# Patient Record
Sex: Female | Born: 1940 | Race: White | Hispanic: No | Smoking: Current some day smoker
Health system: Southern US, Community
[De-identification: ages and names within clinical notes are randomized; demographics above are authoritative.]

## PROBLEM LIST (undated history)

## (undated) DIAGNOSIS — R112 Nausea with vomiting, unspecified: Secondary | ICD-10-CM

## (undated) DIAGNOSIS — J189 Pneumonia, unspecified organism: Secondary | ICD-10-CM

## (undated) DIAGNOSIS — E7800 Pure hypercholesterolemia, unspecified: Secondary | ICD-10-CM

## (undated) DIAGNOSIS — Z9889 Other specified postprocedural states: Secondary | ICD-10-CM

## (undated) DIAGNOSIS — F419 Anxiety disorder, unspecified: Secondary | ICD-10-CM

## (undated) DIAGNOSIS — IMO0001 Reserved for inherently not codable concepts without codable children: Secondary | ICD-10-CM

## (undated) DIAGNOSIS — M1990 Unspecified osteoarthritis, unspecified site: Secondary | ICD-10-CM

## (undated) DIAGNOSIS — Z8711 Personal history of peptic ulcer disease: Secondary | ICD-10-CM

## (undated) DIAGNOSIS — K219 Gastro-esophageal reflux disease without esophagitis: Secondary | ICD-10-CM

## (undated) DIAGNOSIS — R7303 Prediabetes: Secondary | ICD-10-CM

## (undated) DIAGNOSIS — G43909 Migraine, unspecified, not intractable, without status migrainosus: Secondary | ICD-10-CM

## (undated) DIAGNOSIS — U071 COVID-19: Secondary | ICD-10-CM

## (undated) DIAGNOSIS — Z8719 Personal history of other diseases of the digestive system: Secondary | ICD-10-CM

## (undated) DIAGNOSIS — I10 Essential (primary) hypertension: Secondary | ICD-10-CM

## (undated) DIAGNOSIS — C50911 Malignant neoplasm of unspecified site of right female breast: Secondary | ICD-10-CM

## (undated) HISTORY — PX: APPENDECTOMY: SHX54

## (undated) HISTORY — PX: VAGINAL HYSTERECTOMY: SUR661

## (undated) HISTORY — PX: DILATION AND CURETTAGE OF UTERUS: SHX78

## (undated) HISTORY — PX: TONSILLECTOMY: SUR1361

---

## 1997-01-11 DIAGNOSIS — C50911 Malignant neoplasm of unspecified site of right female breast: Secondary | ICD-10-CM

## 1997-01-11 HISTORY — PX: BREAST BIOPSY: SHX20

## 1997-01-11 HISTORY — DX: Malignant neoplasm of unspecified site of right female breast: C50.911

## 1997-01-11 HISTORY — PX: BREAST LUMPECTOMY: SHX2

## 1997-11-04 ENCOUNTER — Ambulatory Visit (HOSPITAL_COMMUNITY): Admission: RE | Admit: 1997-11-04 | Discharge: 1997-11-04 | Payer: Self-pay | Admitting: Neurology

## 1997-11-04 ENCOUNTER — Encounter: Payer: Self-pay | Admitting: Neurology

## 1997-12-02 ENCOUNTER — Other Ambulatory Visit: Admission: RE | Admit: 1997-12-02 | Discharge: 1997-12-02 | Payer: Self-pay | Admitting: General Surgery

## 1997-12-09 ENCOUNTER — Ambulatory Visit (HOSPITAL_BASED_OUTPATIENT_CLINIC_OR_DEPARTMENT_OTHER): Admission: RE | Admit: 1997-12-09 | Discharge: 1997-12-09 | Payer: Self-pay | Admitting: General Surgery

## 1997-12-20 ENCOUNTER — Ambulatory Visit (HOSPITAL_COMMUNITY): Admission: RE | Admit: 1997-12-20 | Discharge: 1997-12-20 | Payer: Self-pay | Admitting: Neurology

## 1997-12-27 ENCOUNTER — Ambulatory Visit (HOSPITAL_COMMUNITY): Admission: RE | Admit: 1997-12-27 | Discharge: 1997-12-27 | Payer: Self-pay | Admitting: General Surgery

## 1997-12-27 ENCOUNTER — Encounter: Payer: Self-pay | Admitting: General Surgery

## 1998-01-22 ENCOUNTER — Encounter: Admission: RE | Admit: 1998-01-22 | Discharge: 1998-04-22 | Payer: Self-pay | Admitting: *Deleted

## 1998-04-24 ENCOUNTER — Encounter: Payer: Self-pay | Admitting: Neurology

## 1998-04-24 ENCOUNTER — Ambulatory Visit (HOSPITAL_COMMUNITY): Admission: RE | Admit: 1998-04-24 | Discharge: 1998-04-24 | Payer: Self-pay | Admitting: Neurology

## 1999-01-14 ENCOUNTER — Encounter: Payer: Self-pay | Admitting: Oncology

## 1999-01-14 ENCOUNTER — Ambulatory Visit (HOSPITAL_COMMUNITY): Admission: RE | Admit: 1999-01-14 | Discharge: 1999-01-14 | Payer: Self-pay | Admitting: Oncology

## 1999-03-07 ENCOUNTER — Encounter: Payer: Self-pay | Admitting: Neurology

## 1999-03-07 ENCOUNTER — Inpatient Hospital Stay (HOSPITAL_COMMUNITY): Admission: EM | Admit: 1999-03-07 | Discharge: 1999-03-10 | Payer: Self-pay | Admitting: Emergency Medicine

## 1999-03-09 ENCOUNTER — Encounter: Payer: Self-pay | Admitting: Neurology

## 1999-03-10 ENCOUNTER — Encounter: Payer: Self-pay | Admitting: Neurology

## 1999-07-09 ENCOUNTER — Encounter: Payer: Self-pay | Admitting: Oncology

## 1999-07-09 ENCOUNTER — Encounter: Admission: RE | Admit: 1999-07-09 | Discharge: 1999-07-09 | Payer: Self-pay | Admitting: Oncology

## 2000-01-08 ENCOUNTER — Encounter: Payer: Self-pay | Admitting: Oncology

## 2000-01-08 ENCOUNTER — Encounter: Admission: RE | Admit: 2000-01-08 | Discharge: 2000-01-08 | Payer: Self-pay | Admitting: Oncology

## 2001-01-09 ENCOUNTER — Encounter: Payer: Self-pay | Admitting: Family Medicine

## 2001-01-09 ENCOUNTER — Encounter: Admission: RE | Admit: 2001-01-09 | Discharge: 2001-01-09 | Payer: Self-pay | Admitting: Family Medicine

## 2002-01-10 ENCOUNTER — Encounter: Admission: RE | Admit: 2002-01-10 | Discharge: 2002-01-10 | Payer: Self-pay | Admitting: Family Medicine

## 2002-01-10 ENCOUNTER — Encounter: Payer: Self-pay | Admitting: Family Medicine

## 2013-12-20 ENCOUNTER — Other Ambulatory Visit: Payer: Self-pay | Admitting: Orthopedic Surgery

## 2013-12-20 DIAGNOSIS — Z9889 Other specified postprocedural states: Secondary | ICD-10-CM

## 2013-12-28 ENCOUNTER — Ambulatory Visit
Admission: RE | Admit: 2013-12-28 | Discharge: 2013-12-28 | Disposition: A | Discharge status: Home / Self Care | Payer: Medicare Other | Source: Ambulatory Visit | Attending: Orthopedic Surgery | Admitting: Orthopedic Surgery

## 2013-12-28 DIAGNOSIS — Z9889 Other specified postprocedural states: Secondary | ICD-10-CM

## 2014-01-16 ENCOUNTER — Other Ambulatory Visit: Payer: Self-pay | Admitting: Orthopedic Surgery

## 2014-01-16 MED ORDER — SODIUM CHLORIDE 0.9 % IV SOLN: 1000.0000 mg | INTRAVENOUS | Status: DC

## 2014-02-14 ENCOUNTER — Encounter (HOSPITAL_COMMUNITY)
Admission: RE | Admit: 2014-02-14 | Discharge: 2014-02-14 | Disposition: A | Discharge status: Home / Self Care | Payer: Medicare Other | Source: Ambulatory Visit | Attending: Orthopedic Surgery | Admitting: Orthopedic Surgery

## 2014-02-14 ENCOUNTER — Encounter (HOSPITAL_COMMUNITY): Payer: Self-pay

## 2014-02-14 ENCOUNTER — Other Ambulatory Visit: Payer: Self-pay

## 2014-02-14 DIAGNOSIS — K219 Gastro-esophageal reflux disease without esophagitis: Secondary | ICD-10-CM | POA: Diagnosis not present

## 2014-02-14 DIAGNOSIS — Z01810 Encounter for preprocedural cardiovascular examination: Secondary | ICD-10-CM | POA: Diagnosis not present

## 2014-02-14 DIAGNOSIS — I10 Essential (primary) hypertension: Secondary | ICD-10-CM | POA: Insufficient documentation

## 2014-02-14 DIAGNOSIS — Z01812 Encounter for preprocedural laboratory examination: Secondary | ICD-10-CM | POA: Diagnosis not present

## 2014-02-14 DIAGNOSIS — Z87891 Personal history of nicotine dependence: Secondary | ICD-10-CM | POA: Insufficient documentation

## 2014-02-14 DIAGNOSIS — M1712 Unilateral primary osteoarthritis, left knee: Secondary | ICD-10-CM | POA: Insufficient documentation

## 2014-02-14 DIAGNOSIS — R7309 Other abnormal glucose: Secondary | ICD-10-CM | POA: Diagnosis not present

## 2014-02-14 HISTORY — DX: Gastro-esophageal reflux disease without esophagitis: K21.9

## 2014-02-14 HISTORY — DX: Essential (primary) hypertension: I10

## 2014-02-14 HISTORY — DX: Nausea with vomiting, unspecified: R11.2

## 2014-02-14 HISTORY — DX: Personal history of other diseases of the digestive system: Z87.19

## 2014-02-14 HISTORY — DX: Other specified postprocedural states: Z98.890

## 2014-02-14 HISTORY — DX: Reserved for inherently not codable concepts without codable children: IMO0001

## 2014-02-14 LAB — CBC
HEMATOCRIT: 38.2 % (ref 36.0–46.0)
HEMOGLOBIN: 12.7 g/dL (ref 12.0–15.0)
MCH: 28.6 pg (ref 26.0–34.0)
MCHC: 33.2 g/dL (ref 30.0–36.0)
MCV: 86 fL (ref 78.0–100.0)
PLATELETS: 312 10*3/uL (ref 150–400)
RBC: 4.44 MIL/uL (ref 3.87–5.11)
RDW: 13.7 % (ref 11.5–15.5)
WBC: 9.1 10*3/uL (ref 4.0–10.5)

## 2014-02-14 LAB — GLUCOSE, CAPILLARY: GLUCOSE-CAPILLARY: 81 mg/dL (ref 70–99)

## 2014-02-14 LAB — BASIC METABOLIC PANEL
ANION GAP: 7 (ref 5–15)
BUN: 14 mg/dL (ref 6–23)
CHLORIDE: 108 mmol/L (ref 96–112)
CO2: 25 mmol/L (ref 19–32)
Calcium: 9.6 mg/dL (ref 8.4–10.5)
Creatinine, Ser: 0.79 mg/dL (ref 0.50–1.10)
GFR calc non Af Amer: 82 mL/min — ABNORMAL LOW (ref >90)
GLUCOSE: 88 mg/dL (ref 70–99)
Potassium: 4.2 mmol/L (ref 3.5–5.1)
SODIUM: 140 mmol/L (ref 135–145)

## 2014-02-14 LAB — SURGICAL PCR SCREEN
MRSA, PCR: NEGATIVE
Staphylococcus aureus: NEGATIVE

## 2014-02-14 NOTE — Progress Notes (Signed)
   02/14/14 1218  OBSTRUCTIVE SLEEP APNEA  Have you ever been diagnosed with sleep apnea through a sleep study? No  Do you snore loudly (loud enough to be heard through closed doors)?  1  Do you often feel tired, fatigued, or sleepy during the daytime? 1  Has anyone observed you stop breathing during your sleep? 1  Do you have, or are you being treated for high blood pressure? 1  BMI more than 35 kg/m2? 0  Age over 72 years old? 1  Neck circumference greater than 40 cm/16 inches? 0  Gender: 0  Obstructive Sleep Apnea Score 5  Score 4 or greater  Results sent to PCP

## 2014-02-14 NOTE — Pre-Procedure Instructions (Signed)
SHIVONNE SCHWARTZMAN  02/14/2014   Your procedure is scheduled on:   Monday  02/25/14  Report to [facility] Admitting at 745 AM.  Call this number if you have problems the morning of surgery: [phone]   Remember:   Do not eat food or drink liquids after midnight.   Take these medicines the morning of surgery with A SIP OF WATER:  OMEPRAZOLE (PRILOSEC)  (STOP IBUPROFEN, ADVIL, MOTRIN 7 DAYS PRIOR TO SURGERY)   Do not wear jewelry, make-up or nail polish.  Do not wear lotions, powders, or perfumes. You may wear deodorant.  Do not shave 48 hours prior to surgery. Men may shave face and neck.  Do not bring valuables to the hospital.  [facility] is not responsible                  for any belongings or valuables.               Contacts, dentures or bridgework may not be worn into surgery.  Leave suitcase in the car. After surgery it may be brought to your room.  For patients admitted to the hospital, discharge time is determined by your                treatment team.               Patients discharged the day of surgery will not be allowed to drive  home.  Name and phone number of your driver:   Special Instructions: [place] - Preparing for Surgery  Before surgery, you can play an important role.  Because skin is not sterile, your skin needs to be as free of germs as possible.  You can reduce the number of germs on you skin by washing with CHG (chlorahexidine gluconate) soap before surgery.  CHG is an antiseptic cleaner which kills germs and bonds with the skin to continue killing germs even after washing.  Please DO NOT use if you have an allergy to CHG or antibacterial soaps.  If your skin becomes reddened/irritated stop using the CHG and inform your nurse when you arrive at Short Stay.  Do not shave (including legs and underarms) for at least 48 hours prior to the first CHG shower.  You may shave your face.  Please follow these instructions carefully:   1.  Shower with  CHG Soap the night before surgery and the                                morning of Surgery.  2.  If you choose to wash your hair, wash your hair first as usual with your       normal shampoo.  3.  After you shampoo, rinse your hair and body thoroughly to remove the                      Shampoo.  4.  Use CHG as you would any other liquid soap.  You can apply chg directly       to the skin and wash gently with scrungie or a clean washcloth.  5.  Apply the CHG Soap to your body ONLY FROM THE NECK DOWN.        Do not use on open wounds or open sores.  Avoid contact with your eyes,       ears, mouth and genitals (private parts).  Wash genitals (private parts)       with your normal soap.  6.  Wash thoroughly, paying special attention to the area where your surgery        will be performed.  7.  Thoroughly rinse your body with warm water from the neck down.  8.  DO NOT shower/wash with your normal soap after using and rinsing off       the CHG Soap.  9.  Pat yourself dry with a clean towel.            10.  Wear clean pajamas.            11.  Place clean sheets on your bed the night of your first shower and do not        sleep with pets.  Day of Surgery  Do not apply any lotions/deoderants the morning of surgery.  Please wear clean clothes to the hospital/surgery center.     Please read over the following fact sheets that you were given: Pain Booklet, Coughing and Deep Breathing, Total Joint Packet, MRSA Information and Surgical Site Infection Prevention

## 2014-02-15 LAB — HEMOGLOBIN A1C
Hgb A1c MFr Bld: 6 % — ABNORMAL HIGH (ref 4.8–5.6)
Mean Plasma Glucose: 126 mg/dL

## 2014-02-24 MED ORDER — CEFAZOLIN SODIUM-DEXTROSE 2-3 GM-% IV SOLR
2.0000 g | INTRAVENOUS | Status: AC
  Administered 2014-02-25: 2 g via INTRAVENOUS
  Filled 2014-02-24: qty 50

## 2014-02-24 MED ORDER — TRANEXAMIC ACID 100 MG/ML IV SOLN
1000.0000 mg | INTRAVENOUS | Status: AC
  Administered 2014-02-25: 1000 mg via INTRAVENOUS
  Filled 2014-02-24: qty 10

## 2014-02-25 ENCOUNTER — Encounter (HOSPITAL_COMMUNITY)
Admission: RE | Disposition: A | Discharge status: Home Health | Payer: Self-pay | Source: Ambulatory Visit | Attending: Orthopedic Surgery

## 2014-02-25 ENCOUNTER — Inpatient Hospital Stay (HOSPITAL_COMMUNITY): Payer: Medicare Other | Admitting: Anesthesiology

## 2014-02-25 ENCOUNTER — Encounter (HOSPITAL_COMMUNITY): Payer: Self-pay | Admitting: *Deleted

## 2014-02-25 ENCOUNTER — Inpatient Hospital Stay (HOSPITAL_COMMUNITY)
Admission: RE | Admit: 2014-02-25 | Discharge: 2014-02-26 | DRG: 470 | Disposition: A | Discharge status: Home Health | Payer: Medicare Other | Source: Ambulatory Visit | Attending: Orthopedic Surgery | Admitting: Orthopedic Surgery

## 2014-02-25 DIAGNOSIS — D62 Acute posthemorrhagic anemia: Secondary | ICD-10-CM | POA: Diagnosis not present

## 2014-02-25 DIAGNOSIS — Z885 Allergy status to narcotic agent status: Secondary | ICD-10-CM | POA: Diagnosis not present

## 2014-02-25 DIAGNOSIS — E780 Pure hypercholesterolemia: Secondary | ICD-10-CM | POA: Diagnosis present

## 2014-02-25 DIAGNOSIS — Z96652 Presence of left artificial knee joint: Secondary | ICD-10-CM

## 2014-02-25 DIAGNOSIS — Z79899 Other long term (current) drug therapy: Secondary | ICD-10-CM | POA: Diagnosis not present

## 2014-02-25 DIAGNOSIS — Z7901 Long term (current) use of anticoagulants: Secondary | ICD-10-CM

## 2014-02-25 DIAGNOSIS — I10 Essential (primary) hypertension: Secondary | ICD-10-CM | POA: Diagnosis present

## 2014-02-25 DIAGNOSIS — Z87891 Personal history of nicotine dependence: Secondary | ICD-10-CM | POA: Diagnosis not present

## 2014-02-25 DIAGNOSIS — Z886 Allergy status to analgesic agent status: Secondary | ICD-10-CM

## 2014-02-25 DIAGNOSIS — M1712 Unilateral primary osteoarthritis, left knee: Principal | ICD-10-CM | POA: Diagnosis present

## 2014-02-25 DIAGNOSIS — K219 Gastro-esophageal reflux disease without esophagitis: Secondary | ICD-10-CM | POA: Diagnosis present

## 2014-02-25 DIAGNOSIS — Z880 Allergy status to penicillin: Secondary | ICD-10-CM | POA: Diagnosis not present

## 2014-02-25 DIAGNOSIS — M25562 Pain in left knee: Secondary | ICD-10-CM | POA: Diagnosis present

## 2014-02-25 HISTORY — PX: REPLACEMENT UNICONDYLAR JOINT KNEE: SUR1227

## 2014-02-25 HISTORY — DX: Personal history of peptic ulcer disease: Z87.11

## 2014-02-25 HISTORY — DX: Prediabetes: R73.03

## 2014-02-25 HISTORY — DX: Migraine, unspecified, not intractable, without status migrainosus: G43.909

## 2014-02-25 HISTORY — DX: Pure hypercholesterolemia, unspecified: E78.00

## 2014-02-25 HISTORY — DX: Personal history of other diseases of the digestive system: Z87.19

## 2014-02-25 HISTORY — DX: Malignant neoplasm of unspecified site of right female breast: C50.911

## 2014-02-25 HISTORY — PX: PARTIAL KNEE ARTHROPLASTY: SHX2174

## 2014-02-25 HISTORY — DX: Unspecified osteoarthritis, unspecified site: M19.90

## 2014-02-25 LAB — GLUCOSE, CAPILLARY
Glucose-Capillary: 82 mg/dL (ref 70–99)
Glucose-Capillary: 98 mg/dL (ref 70–99)
Glucose-Capillary: 98 mg/dL (ref 70–99)

## 2014-02-25 LAB — CBC
HCT: 36.7 % (ref 36.0–46.0)
HEMOGLOBIN: 12.2 g/dL (ref 12.0–15.0)
MCH: 28.8 pg (ref 26.0–34.0)
MCHC: 33.2 g/dL (ref 30.0–36.0)
MCV: 86.6 fL (ref 78.0–100.0)
Platelets: 279 10*3/uL (ref 150–400)
RBC: 4.24 MIL/uL (ref 3.87–5.11)
RDW: 14 % (ref 11.5–15.5)
WBC: 16.1 10*3/uL — ABNORMAL HIGH (ref 4.0–10.5)

## 2014-02-25 LAB — CREATININE, SERUM
Creatinine, Ser: 0.94 mg/dL (ref 0.50–1.10)
GFR calc Af Amer: 69 mL/min — ABNORMAL LOW (ref >90)
GFR, EST NON AFRICAN AMERICAN: 60 mL/min — AB (ref >90)

## 2014-02-25 SURGERY — ARTHROPLASTY, KNEE, UNICOMPARTMENTAL
Anesthesia: General | Site: Knee | Laterality: Left

## 2014-02-25 MED ORDER — ROCURONIUM BROMIDE 100 MG/10ML IV SOLN
INTRAVENOUS | Status: DC | PRN
Start: 2014-02-25 — End: 2014-02-25
  Administered 2014-02-25: 40 mg via INTRAVENOUS

## 2014-02-25 MED ORDER — PROMETHAZINE HCL 25 MG/ML IJ SOLN
6.2500 mg | Freq: Once | INTRAMUSCULAR | Status: AC
  Administered 2014-02-25: 6.25 mg via INTRAVENOUS

## 2014-02-25 MED ORDER — PANTOPRAZOLE SODIUM 40 MG PO TBEC
40.0000 mg | DELAYED_RELEASE_TABLET | Freq: Every day | ORAL | Status: DC
Start: 2014-02-25 — End: 2014-02-26
  Administered 2014-02-25 – 2014-02-26 (×2): 40 mg via ORAL
  Filled 2014-02-25 (×2): qty 1

## 2014-02-25 MED ORDER — OXYCODONE HCL 5 MG PO TABS
5.0000 mg | ORAL_TABLET | ORAL | Status: DC | PRN
  Administered 2014-02-25 – 2014-02-26 (×4): 10 mg via ORAL
  Filled 2014-02-25 (×4): qty 2
  Filled 2014-02-25: qty 1

## 2014-02-25 MED ORDER — METHOCARBAMOL 1000 MG/10ML IJ SOLN
500.0000 mg | Freq: Four times a day (QID) | INTRAVENOUS | Status: DC | PRN
  Administered 2014-02-25: 500 mg via INTRAVENOUS
  Filled 2014-02-25 (×2): qty 5

## 2014-02-25 MED ORDER — FENTANYL CITRATE 0.05 MG/ML IJ SOLN
INTRAMUSCULAR | Status: DC | PRN
  Administered 2014-02-25: 150 ug via INTRAVENOUS
  Administered 2014-02-25: 100 ug via INTRAVENOUS

## 2014-02-25 MED ORDER — HYDROCHLOROTHIAZIDE 10 MG/ML ORAL SUSPENSION
6.2500 mg | Freq: Every day | ORAL | Status: DC
  Administered 2014-02-25 – 2014-02-26 (×2): 6.25 mg via ORAL
  Filled 2014-02-25 (×4): qty 1.25

## 2014-02-25 MED ORDER — IRBESARTAN 75 MG PO TABS
75.0000 mg | ORAL_TABLET | Freq: Every day | ORAL | Status: DC
Start: 2014-02-25 — End: 2014-02-26
  Administered 2014-02-25 – 2014-02-26 (×2): 75 mg via ORAL
  Filled 2014-02-25 (×2): qty 1

## 2014-02-25 MED ORDER — ONDANSETRON HCL 4 MG/2ML IJ SOLN: 4.0000 mg | Freq: Four times a day (QID) | INTRAMUSCULAR | Status: DC | PRN

## 2014-02-25 MED ORDER — METOCLOPRAMIDE HCL 5 MG PO TABS
5.0000 mg | ORAL_TABLET | Freq: Three times a day (TID) | ORAL | Status: DC | PRN
  Filled 2014-02-25: qty 2

## 2014-02-25 MED ORDER — ONDANSETRON HCL 4 MG PO TABS: 4.0000 mg | ORAL_TABLET | Freq: Four times a day (QID) | ORAL | Status: DC | PRN

## 2014-02-25 MED ORDER — HYDROMORPHONE HCL 1 MG/ML IJ SOLN
INTRAMUSCULAR | Status: AC
  Filled 2014-02-25: qty 1

## 2014-02-25 MED ORDER — EPHEDRINE SULFATE 50 MG/ML IJ SOLN
INTRAMUSCULAR | Status: DC | PRN
  Administered 2014-02-25: 5 mg via INTRAVENOUS

## 2014-02-25 MED ORDER — ROCURONIUM BROMIDE 50 MG/5ML IV SOLN
INTRAVENOUS | Status: AC
  Filled 2014-02-25: qty 1

## 2014-02-25 MED ORDER — PROPOFOL 10 MG/ML IV BOLUS
INTRAVENOUS | Status: DC | PRN
  Administered 2014-02-25: 150 mg via INTRAVENOUS

## 2014-02-25 MED ORDER — GLYCOPYRROLATE 0.2 MG/ML IJ SOLN
INTRAMUSCULAR | Status: DC | PRN
  Administered 2014-02-25: 0.4 mg via INTRAVENOUS

## 2014-02-25 MED ORDER — 0.9 % SODIUM CHLORIDE (POUR BTL) OPTIME
TOPICAL | Status: DC | PRN
  Administered 2014-02-25 (×2): 1000 mL

## 2014-02-25 MED ORDER — PHENOL 1.4 % MT LIQD: 1.0000 | OROMUCOSAL | Status: DC | PRN

## 2014-02-25 MED ORDER — DOCUSATE SODIUM 100 MG PO CAPS
100.0000 mg | ORAL_CAPSULE | Freq: Two times a day (BID) | ORAL | Status: DC
  Filled 2014-02-25 (×2): qty 1

## 2014-02-25 MED ORDER — BUPIVACAINE LIPOSOME 1.3 % IJ SUSP
20.0000 mL | Freq: Once | INTRAMUSCULAR | Status: AC
  Administered 2014-02-25: 20 mL
  Filled 2014-02-25: qty 20

## 2014-02-25 MED ORDER — DIPHENHYDRAMINE HCL 12.5 MG/5ML PO ELIX: 12.5000 mg | ORAL_SOLUTION | ORAL | Status: DC | PRN

## 2014-02-25 MED ORDER — LACTATED RINGERS IV SOLN
INTRAVENOUS | Status: DC | PRN
  Administered 2014-02-25 (×2): via INTRAVENOUS

## 2014-02-25 MED ORDER — CHLORHEXIDINE GLUCONATE 4 % EX LIQD
60.0000 mL | Freq: Once | CUTANEOUS | Status: DC
  Filled 2014-02-25: qty 60

## 2014-02-25 MED ORDER — SCOPOLAMINE 1 MG/3DAYS TD PT72
MEDICATED_PATCH | TRANSDERMAL | Status: AC
  Administered 2014-02-25: 1.5 mg
  Filled 2014-02-25: qty 1

## 2014-02-25 MED ORDER — ENOXAPARIN SODIUM 30 MG/0.3ML ~~LOC~~ SOLN
30.0000 mg | Freq: Two times a day (BID) | SUBCUTANEOUS | Status: DC
  Administered 2014-02-26: 30 mg via SUBCUTANEOUS
  Filled 2014-02-25: qty 0.3

## 2014-02-25 MED ORDER — LIDOCAINE HCL (CARDIAC) 20 MG/ML IV SOLN
INTRAVENOUS | Status: AC
Start: 2014-02-25 — End: 2014-02-25
  Filled 2014-02-25: qty 10

## 2014-02-25 MED ORDER — LACTATED RINGERS IV SOLN
INTRAVENOUS | Status: DC
  Administered 2014-02-25: 08:00:00 via INTRAVENOUS

## 2014-02-25 MED ORDER — ONDANSETRON HCL 4 MG/2ML IJ SOLN
4.0000 mg | Freq: Once | INTRAMUSCULAR | Status: AC | PRN
  Administered 2014-02-25: 4 mg via INTRAVENOUS

## 2014-02-25 MED ORDER — HYDROMORPHONE HCL 1 MG/ML IJ SOLN: 1.0000 mg | INTRAMUSCULAR | Status: DC | PRN

## 2014-02-25 MED ORDER — LABETALOL HCL 5 MG/ML IV SOLN
INTRAVENOUS | Status: DC | PRN
  Administered 2014-02-25: 10 mg via INTRAVENOUS

## 2014-02-25 MED ORDER — VANCOMYCIN HCL IN DEXTROSE 1-5 GM/200ML-% IV SOLN
1000.0000 mg | Freq: Two times a day (BID) | INTRAVENOUS | Status: AC
  Administered 2014-02-25: 1000 mg via INTRAVENOUS
  Filled 2014-02-25 (×2): qty 200

## 2014-02-25 MED ORDER — LIDOCAINE HCL (CARDIAC) 20 MG/ML IV SOLN
INTRAVENOUS | Status: DC | PRN
  Administered 2014-02-25: 50 mg via INTRAVENOUS

## 2014-02-25 MED ORDER — SENNOSIDES-DOCUSATE SODIUM 8.6-50 MG PO TABS: 1.0000 | ORAL_TABLET | Freq: Every evening | ORAL | Status: DC | PRN

## 2014-02-25 MED ORDER — SODIUM CHLORIDE 0.9 % IV SOLN
INTRAVENOUS | Status: DC
  Administered 2014-02-25: 19:00:00 via INTRAVENOUS

## 2014-02-25 MED ORDER — FLEET ENEMA 7-19 GM/118ML RE ENEM
1.0000 | ENEMA | Freq: Once | RECTAL | Status: AC | PRN
Start: 2014-02-25 — End: 2014-02-25

## 2014-02-25 MED ORDER — SODIUM CHLORIDE 0.9 % IV SOLN: INTRAVENOUS | Status: DC

## 2014-02-25 MED ORDER — METOCLOPRAMIDE HCL 5 MG/ML IJ SOLN: 5.0000 mg | Freq: Three times a day (TID) | INTRAMUSCULAR | Status: DC | PRN

## 2014-02-25 MED ORDER — FENTANYL CITRATE 0.05 MG/ML IJ SOLN
INTRAMUSCULAR | Status: AC
  Filled 2014-02-25: qty 5

## 2014-02-25 MED ORDER — PROMETHAZINE HCL 25 MG/ML IJ SOLN
INTRAMUSCULAR | Status: AC
  Filled 2014-02-25: qty 1

## 2014-02-25 MED ORDER — VALSARTAN-HYDROCHLOROTHIAZIDE 160-12.5 MG PO TABS: 0.5000 | ORAL_TABLET | Freq: Every day | ORAL | Status: DC

## 2014-02-25 MED ORDER — FENTANYL CITRATE 0.05 MG/ML IJ SOLN
50.0000 ug | Freq: Once | INTRAMUSCULAR | Status: AC
  Administered 2014-02-25: 50 ug via INTRAVENOUS

## 2014-02-25 MED ORDER — PROPOFOL 10 MG/ML IV BOLUS
INTRAVENOUS | Status: AC
  Filled 2014-02-25: qty 20

## 2014-02-25 MED ORDER — ONDANSETRON HCL 4 MG/2ML IJ SOLN
INTRAMUSCULAR | Status: AC
  Filled 2014-02-25: qty 2

## 2014-02-25 MED ORDER — ZOLPIDEM TARTRATE 5 MG PO TABS
5.0000 mg | ORAL_TABLET | Freq: Every evening | ORAL | Status: DC | PRN
Start: 2014-02-25 — End: 2014-02-26

## 2014-02-25 MED ORDER — BISACODYL 5 MG PO TBEC: 5.0000 mg | DELAYED_RELEASE_TABLET | Freq: Every day | ORAL | Status: DC | PRN

## 2014-02-25 MED ORDER — METHOCARBAMOL 500 MG PO TABS
500.0000 mg | ORAL_TABLET | Freq: Four times a day (QID) | ORAL | Status: DC | PRN
  Administered 2014-02-26: 500 mg via ORAL
  Filled 2014-02-25 (×2): qty 1

## 2014-02-25 MED ORDER — ALUM & MAG HYDROXIDE-SIMETH 200-200-20 MG/5ML PO SUSP: 30.0000 mL | ORAL | Status: DC | PRN

## 2014-02-25 MED ORDER — BUPIVACAINE-EPINEPHRINE (PF) 0.5% -1:200000 IJ SOLN
INTRAMUSCULAR | Status: DC | PRN
  Administered 2014-02-25: 30 mL via PERINEURAL

## 2014-02-25 MED ORDER — CEFAZOLIN SODIUM-DEXTROSE 2-3 GM-% IV SOLR: 2.0000 g | INTRAVENOUS | Status: DC

## 2014-02-25 MED ORDER — BUPIVACAINE-EPINEPHRINE (PF) 0.5% -1:200000 IJ SOLN
INTRAMUSCULAR | Status: AC
  Filled 2014-02-25: qty 30

## 2014-02-25 MED ORDER — HYDROMORPHONE HCL 1 MG/ML IJ SOLN
0.2500 mg | INTRAMUSCULAR | Status: DC | PRN
  Administered 2014-02-25 (×4): 0.5 mg via INTRAVENOUS

## 2014-02-25 MED ORDER — NEOSTIGMINE METHYLSULFATE 10 MG/10ML IV SOLN
INTRAVENOUS | Status: DC | PRN
  Administered 2014-02-25: 3 mg via INTRAVENOUS

## 2014-02-25 MED ORDER — ACETAMINOPHEN 500 MG PO TABS
1000.0000 mg | ORAL_TABLET | Freq: Four times a day (QID) | ORAL | Status: AC
  Administered 2014-02-25 – 2014-02-26 (×3): 1000 mg via ORAL
  Filled 2014-02-25 (×4): qty 2

## 2014-02-25 MED ORDER — FENTANYL CITRATE 0.05 MG/ML IJ SOLN
INTRAMUSCULAR | Status: AC
  Filled 2014-02-25: qty 2

## 2014-02-25 MED ORDER — ACETAMINOPHEN 325 MG PO TABS: 650.0000 mg | ORAL_TABLET | Freq: Four times a day (QID) | ORAL | Status: DC | PRN

## 2014-02-25 MED ORDER — ONDANSETRON HCL 4 MG/2ML IJ SOLN
INTRAMUSCULAR | Status: DC | PRN
  Administered 2014-02-25: 4 mg via INTRAVENOUS

## 2014-02-25 MED ORDER — MENTHOL 3 MG MT LOZG: 1.0000 | LOZENGE | OROMUCOSAL | Status: DC | PRN

## 2014-02-25 MED ORDER — SCOPOLAMINE 1 MG/3DAYS TD PT72
1.0000 | MEDICATED_PATCH | TRANSDERMAL | Status: DC
  Filled 2014-02-25: qty 1

## 2014-02-25 MED ORDER — ACETAMINOPHEN 650 MG RE SUPP: 650.0000 mg | Freq: Four times a day (QID) | RECTAL | Status: DC | PRN

## 2014-02-25 SURGICAL SUPPLY — 70 items
BANDAGE ESMARK 6X9 LF (GAUZE/BANDAGES/DRESSINGS) ×1 IMPLANT
BLADE SAW RECIP 87.9 MT (BLADE) ×3 IMPLANT
BLADE SAW SGTL 83.5X18.5 (BLADE) ×3 IMPLANT
BNDG ELASTIC 6X10 VLCR STRL LF (GAUZE/BANDAGES/DRESSINGS) ×3 IMPLANT
BNDG ESMARK 6X9 LF (GAUZE/BANDAGES/DRESSINGS) ×3
BONE KNEE FEMUR AND TIBIA (Bone Implant) ×3 IMPLANT
BOWL SMART MIX CTS (DISPOSABLE) ×3 IMPLANT
CAPT KNEE TOTAL 2 ×3 IMPLANT
CEMENT BONE SIMPLEX SPEEDSET (Cement) ×3 IMPLANT
COVER SURGICAL LIGHT HANDLE (MISCELLANEOUS) ×6 IMPLANT
CUFF TOURNIQUET SINGLE 34IN LL (TOURNIQUET CUFF) ×3 IMPLANT
DRAPE C-ARM 42X72 X-RAY (DRAPES) ×3 IMPLANT
DRAPE EXTREMITY T 121X128X90 (DRAPE) ×3 IMPLANT
DRAPE IMP U-DRAPE 54X76 (DRAPES) ×3 IMPLANT
DRAPE INCISE IOBAN 66X45 STRL (DRAPES) ×6 IMPLANT
DRAPE PROXIMA HALF (DRAPES) ×3 IMPLANT
DRAPE U-SHAPE 47X51 STRL (DRAPES) ×3 IMPLANT
DRSG ADAPTIC 3X8 NADH LF (GAUZE/BANDAGES/DRESSINGS) ×3 IMPLANT
DRSG PAD ABDOMINAL 8X10 ST (GAUZE/BANDAGES/DRESSINGS) ×3 IMPLANT
DURAPREP 26ML APPLICATOR (WOUND CARE) ×6 IMPLANT
ELECT REM PT RETURN 9FT ADLT (ELECTROSURGICAL) ×3
ELECTRODE REM PT RTRN 9FT ADLT (ELECTROSURGICAL) ×1 IMPLANT
EVACUATOR 1/8 PVC DRAIN (DRAIN) ×3 IMPLANT
FLUID NSS /IRRIG 3000 ML XXX (IV SOLUTION) ×3 IMPLANT
GAUZE SPONGE 4X4 12PLY STRL (GAUZE/BANDAGES/DRESSINGS) ×3 IMPLANT
GLOVE BIOGEL M 7.0 STRL (GLOVE) IMPLANT
GLOVE BIOGEL PI IND STRL 6.5 (GLOVE) ×2 IMPLANT
GLOVE BIOGEL PI IND STRL 7.5 (GLOVE) IMPLANT
GLOVE BIOGEL PI IND STRL 8.5 (GLOVE) ×2 IMPLANT
GLOVE BIOGEL PI INDICATOR 6.5 (GLOVE) ×4
GLOVE BIOGEL PI INDICATOR 7.5 (GLOVE)
GLOVE BIOGEL PI INDICATOR 8.5 (GLOVE) ×4
GLOVE SURG ORTHO 8.0 STRL STRW (GLOVE) ×6 IMPLANT
GLOVE SURG SS PI 6.5 STRL IVOR (GLOVE) ×3 IMPLANT
GOWN STRL REUS W/ TWL LRG LVL3 (GOWN DISPOSABLE) ×2 IMPLANT
GOWN STRL REUS W/ TWL XL LVL3 (GOWN DISPOSABLE) ×2 IMPLANT
GOWN STRL REUS W/TWL LRG LVL3 (GOWN DISPOSABLE) ×4
GOWN STRL REUS W/TWL XL LVL3 (GOWN DISPOSABLE) ×4
HANDPIECE INTERPULSE COAX TIP (DISPOSABLE) ×2
HOOD PEEL AWAY FACE SHEILD DIS (HOOD) ×9 IMPLANT
KIT BASIN OR (CUSTOM PROCEDURE TRAY) ×3 IMPLANT
KIT ROOM TURNOVER OR (KITS) ×3 IMPLANT
KNEE FLEX HIGH UNI FEMUR/TIBIA (Knees) ×2 IMPLANT
KNEE MOLDS ( FEMUR/TIBIA) ×2 IMPLANT
M/G UNICOMPARTMENTAL KNEE HEADED SCREW 48MM ×6 IMPLANT
MANIFOLD NEPTUNE II (INSTRUMENTS) ×3 IMPLANT
NEEDLE 21X1 OR PACK (NEEDLE) ×3 IMPLANT
NEEDLE HYPO 21X1 ECLIPSE (NEEDLE) ×3 IMPLANT
NS IRRIG 1000ML POUR BTL (IV SOLUTION) ×3 IMPLANT
PACK TOTAL JOINT (CUSTOM PROCEDURE TRAY) ×3 IMPLANT
PACK UNIVERSAL I (CUSTOM PROCEDURE TRAY) ×3 IMPLANT
PAD ARMBOARD 7.5X6 YLW CONV (MISCELLANEOUS) ×6 IMPLANT
PADDING CAST COTTON 6X4 STRL (CAST SUPPLIES) ×3 IMPLANT
SET HNDPC FAN SPRY TIP SCT (DISPOSABLE) ×1 IMPLANT
SPONGE GAUZE 4X4 12PLY STER LF (GAUZE/BANDAGES/DRESSINGS) ×3 IMPLANT
STAPLER VISISTAT 35W (STAPLE) ×3 IMPLANT
SUCTION FRAZIER TIP 10 FR DISP (SUCTIONS) ×3 IMPLANT
SUT VIC AB 0 CT1 27 (SUTURE) ×4
SUT VIC AB 0 CT1 27XBRD ANBCTR (SUTURE) ×2 IMPLANT
SUT VIC AB 1 CT1 27 (SUTURE) ×2
SUT VIC AB 1 CT1 27XBRD ANBCTR (SUTURE) ×1 IMPLANT
SUT VIC AB 2-0 CT1 27 (SUTURE) ×2
SUT VIC AB 2-0 CT1 TAPERPNT 27 (SUTURE) ×1 IMPLANT
SYR CONTROL 10ML LL (SYRINGE) ×3 IMPLANT
SYRINGE 20CC LL (MISCELLANEOUS) ×6 IMPLANT
TOWEL OR 17X24 6PK STRL BLUE (TOWEL DISPOSABLE) ×3 IMPLANT
TOWEL OR 17X26 10 PK STRL BLUE (TOWEL DISPOSABLE) ×3 IMPLANT
UNICOMPARTMENTAL KNEE FEMUR / TIBIA ×3 IMPLANT
UNICOMPARTMENTAL KNEE HEADED SCREW 33MM ×6 IMPLANT
WATER STERILE IRR 1000ML POUR (IV SOLUTION) ×9 IMPLANT

## 2014-02-25 NOTE — Op Note (Signed)
Dictation [phone]

## 2014-02-25 NOTE — Anesthesia Postprocedure Evaluation (Signed)
  Anesthesia Post-op Note  Patient: Audrey [name]  Procedure(s) Performed: Procedure(s): LEFT UNICOMPARTMENTAL (medial compartment) KNEE (Left)  Patient Location: PACU  Anesthesia Type:General and GA combined with regional for post-op pain  Level of Consciousness: awake, alert , oriented and patient cooperative  Airway and Oxygen Therapy: Patient Spontanous Breathing  Post-op Pain: mild  Post-op Assessment: Post-op Vital signs reviewed, Patient's Cardiovascular Status Stable, Respiratory Function Stable, Patent Airway, No signs of Nausea or vomiting and Pain level controlled  Post-op Vital Signs: stable  Last Vitals:  Filed Vitals:   02/25/14 1500  BP: 151/62  Pulse: 70  Temp:   Resp: 20    Complications: No apparent anesthesia complications

## 2014-02-25 NOTE — H&P (Signed)
  Audrey [name] MRN:  [id number] DOB/SEX:  15-Jan-1940/female  CHIEF COMPLAINT:  Painful left Knee  HISTORY: Patient is a 74 y.o. female presented with a history of pain in the left knee. Onset of symptoms was gradual starting several years ago with gradually worsening course since that time. Prior procedures on the knee include arthroscopy. Patient has been treated conservatively with over-the-counter NSAIDs and activity modification. Patient currently rates pain in the knee at 9 out of 10 with activity. There is pain at night.  PAST MEDICAL HISTORY: There are no active problems to display for this patient.  Past Medical History  Diagnosis Date  . PONV (postoperative nausea and vomiting)   . History of hiatal hernia   . Hypertension   . Shortness of breath dyspnea     w/ exertion   . Diabetes mellitus without complication     borderline  . GERD (gastroesophageal reflux disease)   . Headache     hx migraines   . Cancer     breast   Past Surgical History  Procedure Laterality Date  . Abdominal hysterectomy    . Breast surgery      rt lumpectomy   . Tonsillectomy    . Appendectomy       MEDICATIONS:   No prescriptions prior to admission    ALLERGIES:   Allergies  Allergen Reactions  . Aspirin Other (See Comments)    Stomach pain  . Codeine Nausea And Vomiting  . Penicillins Rash    REVIEW OF SYSTEMS:  A comprehensive review of systems was negative.   FAMILY HISTORY:  No family history on file.  SOCIAL HISTORY:   History  Substance Use Topics  . Smoking status: Former Research scientist (life sciences)  . Smokeless tobacco: Not on file  . Alcohol Use: No     EXAMINATION:  Vital signs in last 24 hours:    General appearance: alert, cooperative and no distress Lungs: clear to auscultation bilaterally Heart: regular rate and rhythm, S1, S2 normal, no murmur, click, rub or gallop Abdomen: soft, non-tender; bowel sounds normal; no masses,  no organomegaly Extremities: extremities  normal, atraumatic, no cyanosis or edema and Homans sign is negative, no sign of DVT Pulses: 2+ and symmetric Skin: Skin color, texture, turgor normal. No rashes or lesions  Musculoskeletal:  ROM 0-115, Ligaments intact,  Imaging Review Plain radiographs demonstrate severe degenerative joint disease of the left knee. The overall alignment is mild varus. The bone quality appears to be good for age and reported activity level.  Assessment/Plan: Primary osteoarthritis, left knee, medial compartment  The patient history, physical examination and imaging studies are consistent with advanced degenerative joint disease of the left knee. The patient has failed conservative treatment.  The clearance notes were reviewed.  After discussion with the patient it was felt that Unicompartmental Knee Replacement was indicated. The procedure,  risks, and benefits of total knee arthroplasty were presented and reviewed. The risks including but not limited to aseptic loosening, infection, blood clots, vascular injury, stiffness, patella tracking problems complications among others were discussed. The patient acknowledged the explanation, agreed to proceed with the plan.  Loryn Haacke 02/25/2014, 6:38 AM

## 2014-02-25 NOTE — Progress Notes (Signed)
Orthopedic Tech Progress Note Patient Details:  Audrey [name] [birth year] [id number]  CPM Left Knee CPM Left Knee: On Left Knee Flexion (Degrees): 60 Left Knee Extension (Degrees): 0 Trapeze bar patient helper will be provided when one becomes available  Hildred Priest 02/25/2014, 12:50 PM

## 2014-02-25 NOTE — Transfer of Care (Signed)
Immediate Anesthesia Transfer of Care Note  Patient: Audrey [name]  Procedure(s) Performed: Procedure(s): LEFT UNICOMPARTMENTAL (medial compartment) KNEE (Left)  Patient Location: PACU  Anesthesia Type:General and regional  Level of Consciousness: sedated  Airway & Oxygen Therapy: Patient Spontanous Breathing and Patient connected to nasal cannula oxygen  Post-op Assessment: Report given to RN, Post -op Vital signs reviewed and stable and Patient moving all extremities X 4  Post vital signs: Reviewed and stable  Last Vitals:  BP 138/58 HR 83  RR 11 Sats 95 on Milo  Complications: No apparent anesthesia complications

## 2014-02-25 NOTE — Anesthesia Procedure Notes (Addendum)
Anesthesia Regional Block:  Adductor canal block  Pre-Anesthetic Checklist: ,, timeout performed, Correct Patient, Correct Site, Correct Laterality, Correct Procedure, Correct Position, site marked, Risks and benefits discussed,  Surgical consent,  Pre-op evaluation,  At surgeon's request and post-op pain management  Laterality: Left  Prep: Maximum Sterile Barrier Precautions used, chloraprep and alcohol swabs       Needles:  Injection technique: Single-shot  Needle Type: Stimulator Needle - 80        Needle insertion depth: 5 cm   Additional Needles:  Procedures: Doppler guided Adductor canal block Narrative:  Start time: 02/25/2014 8:55 AM End time: 02/25/2014 9:00 AM Injection made incrementally with aspirations every 5 mL.  Performed by: Personally  Anesthesiologist: Kate Sable  Additional Notes: Pt accepts procedure w/ risks. 15cc 0.5% Marcaine w/ epi w/o difficulty and w/ mild discomfort. GES   Procedure Name: Intubation Date/Time: 02/25/2014 10:23 AM Performed by: Eligha Bridegroom Pre-anesthesia Checklist: Patient identified, Timeout performed, Emergency Drugs available, Suction available and Patient being monitored Patient Re-evaluated:Patient Re-evaluated prior to inductionOxygen Delivery Method: Circle system utilized Preoxygenation: Pre-oxygenation with 100% oxygen Intubation Type: IV induction Ventilation: Mask ventilation without difficulty Laryngoscope Size: Mac and 3 Grade View: Grade III Tube type: Oral Tube size: 7.5 mm Number of attempts: 1 Airway Equipment and Method: Stylet and Bougie stylet Placement Confirmation: breath sounds checked- equal and bilateral and positive ETCO2 Secured at: 21 cm Tube secured with: Tape Dental Injury: Teeth and Oropharynx as per pre-operative assessment  Difficulty Due To: Difficulty was anticipated, Difficult Airway- due to anterior larynx and Difficult Airway- due to immobile epiglottis Future Recommendations:  Recommend- induction with short-acting agent, and alternative techniques readily available

## 2014-02-25 NOTE — Anesthesia Preprocedure Evaluation (Signed)
Anesthesia Evaluation  Patient identified by MRN, date of birth, ID band Patient awake    Reviewed: Allergy & Precautions, NPO status , Patient's Chart, lab work & pertinent test results  History of Anesthesia Complications (+) PONV  Airway        Dental   Pulmonary former smoker,          Cardiovascular hypertension,     Neuro/Psych  Headaches,    GI/Hepatic hiatal hernia, GERD-  ,  Endo/Other  diabetes, Type 2, Oral Hypoglycemic Agents  Renal/GU      Musculoskeletal  (+) Arthritis -,   Abdominal   Peds  Hematology   Anesthesia Other Findings   Reproductive/Obstetrics                             Anesthesia Physical Anesthesia Plan  ASA: II  Anesthesia Plan: General   Post-op Pain Management:    Induction: Intravenous  Airway Management Planned: LMA and Oral ETT  Additional Equipment:   Intra-op Plan:   Post-operative Plan: Extubation in OR  Informed Consent: I have reviewed the patients History and Physical, chart, labs and discussed the procedure including the risks, benefits and alternatives for the proposed anesthesia with the patient or authorized representative who has indicated his/her understanding and acceptance.     Plan Discussed with: CRNA, Anesthesiologist and Surgeon  Anesthesia Plan Comments:         Anesthesia Quick Evaluation

## 2014-02-26 ENCOUNTER — Encounter (HOSPITAL_COMMUNITY): Payer: Self-pay | Admitting: Orthopedic Surgery

## 2014-02-26 LAB — GLUCOSE, CAPILLARY
Glucose-Capillary: 122 mg/dL — ABNORMAL HIGH (ref 70–99)
Glucose-Capillary: 125 mg/dL — ABNORMAL HIGH (ref 70–99)
Glucose-Capillary: 137 mg/dL — ABNORMAL HIGH (ref 70–99)

## 2014-02-26 LAB — CBC
HEMATOCRIT: 35.4 % — AB (ref 36.0–46.0)
Hemoglobin: 11.4 g/dL — ABNORMAL LOW (ref 12.0–15.0)
MCH: 28.5 pg (ref 26.0–34.0)
MCHC: 32.2 g/dL (ref 30.0–36.0)
MCV: 88.5 fL (ref 78.0–100.0)
PLATELETS: 262 10*3/uL (ref 150–400)
RBC: 4 MIL/uL (ref 3.87–5.11)
RDW: 14 % (ref 11.5–15.5)
WBC: 12.5 10*3/uL — AB (ref 4.0–10.5)

## 2014-02-26 LAB — BASIC METABOLIC PANEL
Anion gap: 12 (ref 5–15)
BUN: 10 mg/dL (ref 6–23)
CHLORIDE: 103 mmol/L (ref 96–112)
CO2: 19 mmol/L (ref 19–32)
CREATININE: 0.77 mg/dL (ref 0.50–1.10)
Calcium: 8.4 mg/dL (ref 8.4–10.5)
GFR calc Af Amer: >90 mL/min (ref >90)
GFR calc non Af Amer: 83 mL/min — ABNORMAL LOW (ref >90)
Glucose, Bld: 104 mg/dL — ABNORMAL HIGH (ref 70–99)
Potassium: 3.9 mmol/L (ref 3.5–5.1)
Sodium: 134 mmol/L — ABNORMAL LOW (ref 135–145)

## 2014-02-26 MED ORDER — METHOCARBAMOL 500 MG PO TABS: 500.0000 mg | ORAL_TABLET | Freq: Four times a day (QID) | ORAL | Status: DC | PRN

## 2014-02-26 MED ORDER — OXYCODONE HCL 5 MG PO TABS: 5.0000 mg | ORAL_TABLET | ORAL | Status: DC | PRN

## 2014-02-26 MED ORDER — ENOXAPARIN SODIUM 40 MG/0.4ML ~~LOC~~ SOLN: 40.0000 mg | SUBCUTANEOUS | Status: DC

## 2014-02-26 NOTE — Discharge Instructions (Signed)
Diet: As you were doing prior to hospitalization   Activity:  Increase activity slowly as tolerated                  No lifting or driving for 6 weeks  Shower:  May shower without a dressing once there is no drainage from your wound.                 Do NOT wash over the wound.                 Dressing:  You may change your dressing on Wednesday                    Then change the dressing daily with sterile 4"x4"s gauze dressing                     And TED hose for knees.  Weight Bearing:  Weight bearing as tolerated as taught in physical therapy.  Use a                                walker or Crutches as instructed.  To prevent constipation: you may use a stool softener such as -               Colace ( over the counter) 100 mg by mouth twice a day                Drink plenty of fluids ( prune juice may be helpful) and high fiber foods                Miralax ( over the counter) for constipation as needed.    Precautions:  If you experience chest pain or shortness of breath - call 911 immediately               For transfer to the hospital emergency department!!               If you develop a fever greater that 101 F, purulent drainage from wound,                             increased redness or drainage from wound, or calf pain -- Call the office.  Follow- Up Appointment:  Please call for an appointment to be seen on 03/12/14                                              [facility] office:  [phone]            [address] [place], [place] [address]

## 2014-02-26 NOTE — Progress Notes (Signed)
Patient d/c to home, IV removed, prescriptions given, instructions reviewed. 

## 2014-02-26 NOTE — Evaluation (Signed)
Occupational Therapy Evaluation Patient Details Name: Audrey [name] MRN: [id number] DOB: [birth year] Today's Date: 02/26/2014    History of Present Illness s/p L knee unicompartmental arthroplasty   Clinical Impression   Patient independent PTA. Patient currently functioning at an overall supervision>min guard level. Patient will benefit from acute OT to increase overall independence in the areas of ADLs, functional mobility, education on RW safety, and overall safely in order to safely discharge home with 24/7 supervision/assistance.     Follow Up Recommendations  No OT follow up;Supervision/Assistance - 24 hour    Equipment Recommendations  3 in 1 bedside comode    Recommendations for Other Services  None at this time     Precautions / Restrictions Precautions Precautions: Knee Precaution Comments: reviewed knee precautions and no pillow under knee Restrictions Weight Bearing Restrictions: Yes LLE Weight Bearing: Weight bearing as tolerated      Mobility Bed Mobility Overal bed mobility: Needs Assistance Bed Mobility: Sit to Supine     Supine to sit: Supervision;HOB elevated Sit to supine: Supervision   General bed mobility comments: cues to encourage active movement through LLE during bed mobility  Transfers Overall transfer level: Needs assistance Equipment used: Rolling walker (2 wheeled) Transfers: Sit to/from Stand Sit to Stand: Min guard         General transfer comment: cues for hand placement and technique - tendency for pt to pull up on RW. Also encouraged WB through LLE during transfers to increase ROM as well    Balance Overall balance assessment: Needs assistance Sitting-balance support: No upper extremity supported;Feet supported Sitting balance-Leahy Scale: Good     Standing balance support: Bilateral upper extremity supported;During functional activity Standing balance-Leahy Scale: Poor Standing balance comment: using RW for support      ADL Overall ADL's : Needs assistance/impaired Eating/Feeding: Independent   Grooming: Standing;Supervision/safety   Upper Body Bathing: Set up;Sitting   Lower Body Bathing: Min guard;Sit to/from stand;Cueing for safety   Upper Body Dressing : Set up;Sitting   Lower Body Dressing: Min guard;Sit to/from stand;Cueing for safety   Toilet Transfer: Supervision/safety;RW;Cueing for safety   Toileting- Clothing Manipulation and Hygiene: Supervision/safety;Cueing for safety       Functional mobility during ADLs: Supervision/safety;Cueing for safety;Rolling walker General ADL Comments: Patient able to reach> BLEs for LB ADLs. Patient stood with RW and ambulated from recliner>BR>back to bed. Moderate verbal cues required for RW safey during functional mobility/transfers. Education on energy conservation taught and encouraged patient to take breaks prn. Patient reports that daughter and "friend" will be there prn/24/7. Reiterated importance of 24/7 supervision/assistance once discharged>home.     Pertinent Vitals/Pain Pain Assessment: 0-10 Pain Score: 5  Pain Location: Lef knee Pain Descriptors / Indicators: Aching Pain Intervention(s): Monitored during session;Repositioned     Hand Dominance Right   Extremity/Trunk Assessment Upper Extremity Assessment Upper Extremity Assessment: Overall WFL for tasks assessed   Lower Extremity Assessment Lower Extremity Assessment: Defer to PT evaluation LLE Deficits / Details: post op pain and weakness; decreased knee ROM   Cervical / Trunk Assessment Cervical / Trunk Assessment: Normal   Communication Communication Communication: No difficulties   Cognition Arousal/Alertness: Awake/alert Behavior During Therapy: WFL for tasks assessed/performed Overall Cognitive Status: Within Functional Limits for tasks assessed              Home Living Family/patient expects to be discharged to:: Private residence Living Arrangements:  Alone Available Help at Discharge: Family;Friend(s);Available 24 hours/day Type of Home: House Home Access: Stairs to enter  Entrance Stairs-Number of Steps: 1 Entrance Stairs-Rails: None Home Layout: Multi-level;Bed/bath upstairs Alternate Level Stairs-Number of Steps: 7 Alternate Level Stairs-Rails: Right;Left Bathroom Shower/Tub: Tub/shower unit;Curtain   Bathroom Toilet: Standard     Home Equipment: None    Prior Functioning/Environment Level of Independence: Independent       OT Diagnosis: Generalized weakness;Acute pain   OT Problem List: Decreased strength;Decreased range of motion;Decreased activity tolerance;Impaired balance (sitting and/or standing);Decreased coordination;Decreased safety awareness;Decreased knowledge of use of DME or AE;Decreased knowledge of precautions;Pain   OT Treatment/Interventions: Self-care/ADL training;Therapeutic exercise;Energy conservation;DME and/or AE instruction;Therapeutic activities;Patient/family education;Balance training    OT Goals(Current goals can be found in the care plan section) Acute Rehab OT Goals Patient Stated Goal: go home today OT Goal Formulation: With patient Time For Goal Achievement: 03/05/14 Potential to Achieve Goals: Good ADL Goals Pt Will Perform Grooming: with modified independence;standing Pt Will Perform Lower Body Bathing: sit to/from stand;with modified independence Pt Will Perform Lower Body Dressing: sit to/from stand;with modified independence Pt Will Transfer to Toilet: with modified independence;ambulating;bedside commode Pt Will Perform Toileting - Clothing Manipulation and hygiene: with modified independence;sit to/from stand Pt Will Perform Tub/Shower Transfer: with modified independence;3 in 1;ambulating;Tub transfer;rolling walker Additional ADL Goal #1: Patient will safely use RW independently for functional mobility/transfers 100% of the time  OT Frequency: Min 2X/week   Barriers to D/C: None  known at this time          End of Session Equipment Utilized During Treatment: Rolling walker CPM Left Knee CPM Left Knee: Off  Activity Tolerance: Patient tolerated treatment well Patient left: in bed;with call bell/phone within reach;with family/visitor present   Time: 0913-0930 OT Time Calculation (min): 17 min Charges:  OT General Charges $OT Visit: 1 Procedure OT Evaluation $Initial OT Evaluation Tier I: 1 Procedure  Sanjeev Main , MS, OTR/L, CLT Pager: [phone]  02/26/2014, 9:52 AM

## 2014-02-26 NOTE — Op Note (Signed)
[name], [name]              ACCOUNT NO.:  [id number]  MEDICAL RECORD NO.:  [id number]  LOCATION:  5N12C                        FACILITY:  [facility]  PHYSICIAN:  Estill Bamberg. Ronnie Derby, M.D. DATE OF BIRTH:  1940/08/23  DATE OF PROCEDURE:  02/25/2014 DATE OF DISCHARGE:                              OPERATIVE REPORT   SURGEON:  Estill Bamberg. Ronnie Derby, M.D.  ASSISTANT:  Carlynn Spry, PA-C  ANESTHESIA:  General.  PREOPERATIVE DIAGNOSIS:  Left medial compartment osteoarthritis of the knee.  POSTOPERATIVE DIAGNOSIS:  Left medial compartment osteoarthritis of the knee.  PROCEDURE:  Left knee unicompartmental arthroplasty.  INDICATION FOR PROCEDURE:  The patient is a 74 year old, white female, who failed conservative measures for left knee medial compartmental osteoarthritis.  Informed consent obtained.  DESCRIPTION OF PROCEDURE:  The patient was laid supine and administered general anesthesia.  Left leg prepped and draped in usual fashion.  The extremity was exsanguinated with Esmarch and the tourniquet inflated to 350 mmHg and kept for 1 hour.  After sterile prep and drape, a midline incision was made from the inferior pole of the patella to the tibial tubercle.  I then made a median parapatellar arthrotomy and removed the retropatellar fat pad over the medial compartment.  I then subperiosteally elevated the deep MCL off the medial crest of the tibia. I then used the patient-specific cutting block to make the tibial cut and the femoral cuts.  I then used a C finishing block to finish the femur, a 2 finishing block to finish the tibia.  I then trialed with a 2 tibia, C femur, 9 insert, and had good flexion-extension gap balance.  I then removed the trial components and copiously irrigated.  I placed Exparel 20 mL with 0.5% Marcaine 30 mL all around the knee with a peppering technique.  I then proceeded to cement in the components removing all excess cement in extension.  At this point, I  let the tourniquet down.  This was at 45 minutes.  I then lavaged again and closed the arthrotomy with a figure-of-eight #1 Vicryl sutures, deep soft tissue buried with 0 Vicryl sutures, subcuticular 4-0 Monocryl, Steri-Strips.  I then dressed with Xeroform, dressing sponges, sterile Webril, and an Ace wrap.  COMPLICATIONS:  None.  DRAINS:  None.          ______________________________ Estill Bamberg. Ronnie Derby, M.D.     SDL/MEDQ  D:  02/25/2014  T:  02/26/2014  Job:  [id number]

## 2014-02-26 NOTE — Evaluation (Signed)
Physical Therapy Evaluation Patient Details Name: Audrey [name] MRN: [id number] DOB: January 30, 1940 Today's Date: 02/26/2014   History of Present Illness  s/p L knee unicompartmental arthroplasty  Clinical Impression  Pt presents with the below listed impairments and will benefit from skilled PT intervention to address these impairments and increase functional independence with mobility. Pt overall steady A with RW and limited by pain mostly. Anticipate pt will be ready for d/c soon but would benefit from continued HHPT to address stair negotiation, gait, and ROM/strengthening.     Follow Up Recommendations Home health PT;Supervision for mobility/OOB    Equipment Recommendations  Rolling walker with 5" wheels    Recommendations for Other Services       Precautions / Restrictions Precautions Precautions: Knee Restrictions Weight Bearing Restrictions: Yes LLE Weight Bearing: Weight bearing as tolerated      Mobility  Bed Mobility Overal bed mobility: Needs Assistance Bed Mobility: Supine to Sit     Supine to sit: Supervision;HOB elevated     General bed mobility comments: cues to encourage active movement through LLE during bed mobility  Transfers Overall transfer level: Needs assistance Equipment used: Rolling walker (2 wheeled) Transfers: Sit to/from Stand Sit to Stand: Min guard         General transfer comment: cues for hand placement and technique - tendency for pt to pull up on RW. Also encouraged WB through LLE during transfers to increase ROM as well  Ambulation/Gait Ambulation/Gait assistance: Min guard Ambulation Distance (Feet): 30 Feet Assistive device: Rolling walker (2 wheeled) Gait Pattern/deviations: Step-to pattern;Antalgic;Decreased stance time - left Gait velocity: decreased Gait velocity interpretation: Below normal speed for age/gender General Gait Details: Cues for upright posture and to increase WB through LLE for more normalized gait  pattern. Maintains L knee stiff during gait.  Stairs Stairs: Yes Stairs assistance: Min assist Stair Management: No rails;One rail Right;One rail Left;Step to pattern;With walker Number of Stairs: 3 General stair comments: Practiced stairs with rails for simulating getting to second level of home where living area and bed/bath is located. Also practiced up/down curb step with RW to simulate home entry from outside. Pt requires cues for which foot to lead with and overall min A for balance and management of RW. Pt's friend present during session and verbalized understanding of guarding technique. He plans to be present when she discharges and both verbalize feeling OK with technique.  Wheelchair Mobility    Modified Rankin (Stroke Patients Only)       Balance Overall balance assessment: Needs assistance   Sitting balance-Leahy Scale: Good     Standing balance support: Bilateral upper extremity supported;Single extremity supported Standing balance-Leahy Scale: Poor Standing balance comment: using RW for support                             Pertinent Vitals/Pain Pain Assessment: 0-10 Pain Score: 4  Pain Location: L knee Pain Descriptors / Indicators: Aching;Operative site guarding Pain Intervention(s): Monitored during session;Premedicated before session    Home Living Family/patient expects to be discharged to:: Private residence Living Arrangements: Alone;Other (Comment) (will have daughter staying with her upon d/c) Available Help at Discharge: Family;Friend(s);Available 24 hours/day Type of Home: House Home Access: Stairs to enter Entrance Stairs-Rails: None Entrance Stairs-Number of Steps: 1 Home Layout: Multi-level;Bed/bath upstairs (split level)        Prior Function Level of Independence: Independent  Hand Dominance        Extremity/Trunk Assessment   Upper Extremity Assessment: Defer to OT evaluation           Lower  Extremity Assessment: LLE deficits/detail   LLE Deficits / Details: post op pain and weakness; decreased knee ROM  Cervical / Trunk Assessment: Normal  Communication   Communication: No difficulties  Cognition Arousal/Alertness: Awake/alert Behavior During Therapy: WFL for tasks assessed/performed Overall Cognitive Status: Within Functional Limits for tasks assessed                      General Comments General comments (skin integrity, edema, etc.): Educated on use of zero-knee foam and importance of HEP.     Exercises Total Joint Exercises Ankle Circles/Pumps: AROM;Strengthening;Both;5 reps Heel Slides: Left;AAROM;5 reps;Supine Goniometric ROM: ~30 degrees active knee flexion in supine      Assessment/Plan    PT Assessment Patient needs continued PT services  PT Diagnosis Abnormality of gait;Acute pain   PT Problem List Decreased strength;Decreased range of motion;Decreased activity tolerance;Decreased balance;Decreased mobility;Decreased knowledge of use of DME;Pain;Decreased skin integrity  PT Treatment Interventions DME instruction;Gait training;Stair training;Functional mobility training;Therapeutic activities;Therapeutic exercise;Balance training;Neuromuscular re-education;Patient/family education;Modalities   PT Goals (Current goals can be found in the Care Plan section) Acute Rehab PT Goals Patient Stated Goal: not have so much pain PT Goal Formulation: With patient (and friend) Time For Goal Achievement: 03/05/14 Potential to Achieve Goals: Good    Frequency 7X/week (BID)   Barriers to discharge   Plans to have daughter to stay with her upon d/c    Co-evaluation               End of Session Equipment Utilized During Treatment: Gait belt Activity Tolerance: Patient limited by pain;Patient tolerated treatment well Patient left: in chair;with call bell/phone within reach;with family/visitor present (zero-knee foam on) Nurse Communication: Mobility  status         Time: 7680-8811 PT Time Calculation (min) (ACUTE ONLY): 31 min   Charges:   PT Evaluation $Initial PT Evaluation Tier I: 1 Procedure PT Treatments $Gait Training: 23-37 mins   PT G Codes:        Allayne Gitelman 02/26/2014, 9:27 AM  Pager #: [phone]

## 2014-02-26 NOTE — Progress Notes (Signed)
Physical Therapy Treatment Patient Details Name: Audrey [name] MRN: [id number] DOB: [birth year] Today's Date: 02/26/2014    History of Present Illness  S/P L unicompartmental knee replacement    PT Comments    Pt with increased pain this afternoon and was only able to ambulate x 35' with RW, but did so at min/guard level.  Pt also ambulated to restroom.  Discussed stair negotiation again with pt and daughter to ensure safe entry.  Pt and daughter feel they can manage getting in/out of house today.  RN made aware.   Follow Up Recommendations  Home health PT;Supervision for mobility/OOB     Equipment Recommendations  Rolling walker with 5" wheels    Recommendations for Other Services       Precautions / Restrictions Precautions Precautions: Knee Restrictions Weight Bearing Restrictions: No LLE Weight Bearing: Weight bearing as tolerated    Mobility  Bed Mobility Overal bed mobility: Needs Assistance Bed Mobility: Sit to Supine       Sit to supine: Min assist   General bed mobility comments: Requires light assist for LLE into bed at end of session.   Transfers Overall transfer level: Needs assistance Equipment used: Rolling walker (2 wheeled) Transfers: Sit to/from Stand Sit to Stand: Supervision         General transfer comment: Mod cues for hand placement, as she continues to want to pull on RW to stand.  Continue to encourage WB through LLE   Ambulation/Gait Ambulation/Gait assistance: Min guard Ambulation Distance (Feet): 35 Feet Assistive device: Rolling walker (2 wheeled) Gait Pattern/deviations: Step-to pattern;Decreased stride length;Decreased step length - right;Decreased stance time - left;Antalgic Gait velocity: decreased       Stairs            Wheelchair Mobility    Modified Rankin (Stroke Patients Only)       Balance                                    Cognition Arousal/Alertness: Awake/alert Behavior During  Therapy: WFL for tasks assessed/performed Overall Cognitive Status: Within Functional Limits for tasks assessed                      Exercises      General Comments        Pertinent Vitals/Pain Pain Assessment: 0-10 Pain Score: 5  Pain Location: L knee Pain Descriptors / Indicators: Aching Pain Intervention(s): Monitored during session;Premedicated before session    Home Living                      Prior Function            PT Goals (current goals can now be found in the care plan section) Acute Rehab PT Goals Patient Stated Goal: go home today PT Goal Formulation: With patient Time For Goal Achievement: 03/05/14 Potential to Achieve Goals: Good Progress towards PT goals: Progressing toward goals    Frequency  7X/week    PT Plan Current plan remains appropriate    Co-evaluation             End of Session   Activity Tolerance: Patient limited by pain Patient left: in bed;with call bell/phone within reach;with family/visitor present     Time: 1749-4496 PT Time Calculation (min) (ACUTE ONLY): 26 min  Charges:  $Gait Training: 8-22 mins $Therapeutic Activity: 8-22 mins  G Codes:      Denice Bors 02/26/2014, 7:14 PM

## 2014-02-26 NOTE — Progress Notes (Signed)
02/26/14 Spoke with patient about HHC. She selected [facility]. Contacted Abigail Butts at [facility] and set up [place]. They will start service 02/27/14 id patient discharged today.Faxed order, face to face, faceshee,. H and P and op note to [phone] and received confirmation.  Spoke with Ruby Cola with [employer], they will provide CPM, rolling walker and 3N1. Patient states that they will have family available to assist after d/c. Will continue to follow until discharge.

## 2014-02-26 NOTE — OR Nursing (Signed)
Late entry on 02-26-14 by Etheleen Mayhew, RN to add delay code.

## 2014-02-26 NOTE — Progress Notes (Signed)
PT Cancellation Note  Patient Details Name: Audrey [name] MRN: [id number] DOB: 01/03/1941   Cancelled Treatment:    Reason Eval/Treat Not Completed: Other (comment) (Pt declined). Attempted to see pt for second session (BID) before tentative d/c this evening, but pt declined at this time stating she would prefer to try later this afternoon. Will follow up as able.   Canary Brim [facility] 02/26/2014, 11:19 AM

## 2014-02-26 NOTE — Progress Notes (Signed)
SPORTS MEDICINE AND JOINT REPLACEMENT  [name], MD   Carlynn Spry, PA-C [name], [name], [name]  [address]                             [phone]   PROGRESS NOTE  Subjective:  negative for Chest Pain  negative for Shortness of Breath  negative for Nausea/Vomiting   negative for Calf Pain  negative for Bowel Movement   Tolerating Diet: yes         Patient reports pain as 4 on 0-10 scale.    Objective: Vital signs in last 24 hours:   Patient Vitals for the past 24 hrs:  BP Temp Temp src Pulse Resp SpO2  02/26/14 1045 (!) 172/74 mmHg 98.3 F (36.8 C) Oral 94 20 92 %  02/26/14 0643 (!) 160/60 mmHg 98.1 F (36.7 C) Oral 90 - 95 %  02/26/14 0400 - - - - 18 94 %  02/26/14 0122 (!) 142/65 mmHg 98.2 F (36.8 C) Oral 96 - 94 %  02/26/14 0000 - - - - 18 94 %  02/25/14 2000 - - - - 18 94 %  02/25/14 1949 (!) 163/64 mmHg 98.1 F (36.7 C) Axillary 90 - 100 %  02/25/14 1901 (!) 150/66 mmHg 98.5 F (36.9 C) Oral 85 18 98 %  02/25/14 1700 - - - 79 14 96 %  02/25/14 1636 - - - 87 (!) 23 96 %  02/25/14 1630 - - - 63 16 95 %  02/25/14 1615 - - - 62 19 97 %  02/25/14 1600 - - - 84 (!) 25 97 %  02/25/14 1545 - - - 81 (!) 23 97 %  02/25/14 1530 - - - 70 (!) 25 96 %  02/25/14 1515 - - - 61 12 95 %  02/25/14 1500 (!) 151/62 mmHg - - 70 20 94 %  02/25/14 1445 - - - 60 15 93 %  02/25/14 1430 - - - 77 (!) 21 98 %  02/25/14 1415 - - - 73 (!) 21 97 %  02/25/14 1400 (!) 136/59 mmHg 97.4 F (36.3 C) - 65 10 95 %  02/25/14 1345 (!) 137/51 mmHg - - 62 - 91 %  02/25/14 1330 (!) 148/55 mmHg - - 64 18 97 %  02/25/14 1319 140/60 mmHg - - 73 20 93 %    @flow {1959:LAST@   Intake/Output from previous day:   02/15 0701 - 02/16 0700 In: 2341.3 [P.O.:250; I.V.:2091.3] Out: 25    Intake/Output this shift:   02/16 0701 - 02/16 1900 In: 240 [P.O.:240] Out: -    Intake/Output      02/15 0701 - 02/16 0700 02/16 0701 - 02/17 0700   P.O. 250 240   I.V. 2091.3    Total Intake  2341.3 240   Blood 25    Total Output 25     Net +2316.3 +240        Urine Occurrence  1 x      LABORATORY DATA:  Recent Labs  02/25/14 1839 02/26/14 0610  WBC 16.1* 12.5*  HGB 12.2 11.4*  HCT 36.7 35.4*  PLT 279 262    Recent Labs  02/25/14 1839 02/26/14 0610  NA  --  134*  K  --  3.9  CL  --  103  CO2  --  19  BUN  --  10  CREATININE 0.94 0.77  GLUCOSE  --  104*  CALCIUM  --  8.4   No results found for: INR, PROTIME  Examination:  General appearance: alert, cooperative and no distress Extremities: Homans sign is negative, no sign of DVT  Wound Exam: clean, dry, intact   Drainage:  None: wound tissue dry  Motor Exam: EHL and FHL Intact  Sensory Exam: Deep Peroneal normal   Assessment:    1 Day Post-Op  Procedure(s) (LRB): LEFT UNICOMPARTMENTAL (medial compartment) KNEE (Left)  ADDITIONAL DIAGNOSIS:  Active Problems:   S/P left unicompartmental knee replacement  Acute Blood Loss Anemia   Plan: Physical Therapy as ordered Weight Bearing as Tolerated (WBAT)  DVT Prophylaxis:  Lovenox  DISCHARGE PLAN: Home  DISCHARGE NEEDS: HHPT, CPM, Walker and 3-in-1 comode seat         Audrey [name] 02/26/2014, 1:16 PM

## 2014-03-13 ENCOUNTER — Encounter (HOSPITAL_COMMUNITY): Payer: Self-pay | Admitting: Orthopedic Surgery

## 2014-03-19 NOTE — Discharge Summary (Signed)
SPORTS MEDICINE & JOINT REPLACEMENT   [name], MD   Carlynn Spry, PA-C [place], [place], [place]  [address]                             [phone]  PATIENT ID: Audrey [name]        MRN:  [id number]          DOB/AGE: [birth year] / 74 y.o.    DISCHARGE SUMMARY  ADMISSION DATE:    02/25/2014 DISCHARGE DATE:  02/26/2014  ADMISSION DIAGNOSIS: primary osteoarthritis medial compartment left knee    DISCHARGE DIAGNOSIS:  primary osteoarthritis medial compartment left knee    ADDITIONAL DIAGNOSIS: Active Problems:   S/P left unicompartmental knee replacement  Past Medical History  Diagnosis Date  . History of hiatal hernia   . Hypertension   . Shortness of breath dyspnea     w/ exertion   . GERD (gastroesophageal reflux disease)   . PONV (postoperative nausea and vomiting)   . High cholesterol     "can't take the RX; makes my bones ache" (02/25/2014)  . Borderline type 2 diabetes mellitus   . History of stomach ulcers   . Migraines     hx  . Arthritis     "hands" (02/25/2014)  . Breast cancer, right breast 1999    PROCEDURE: Procedure(s): LEFT UNICOMPARTMENTAL (medial compartment) KNEE on 02/25/2014  CONSULTS:     HISTORY:  See H&P in chart  HOSPITAL COURSE:  Audrey [name] is a 74 y.o. admitted on 02/25/2014 and found to have a diagnosis of primary osteoarthritis medial compartment left knee.  After appropriate laboratory studies were obtained  they were taken to the operating room on 02/25/2014 and underwent Procedure(s): LEFT UNICOMPARTMENTAL (medial compartment) KNEE.   They were given perioperative antibiotics:  Anti-infectives    Start     Dose/Rate Route Frequency Ordered Stop   02/25/14 1445  vancomycin (VANCOCIN) IVPB 1000 mg/200 mL premix     1,000 mg 200 mL/hr over 60 Minutes Intravenous Every 12 hours 02/25/14 1444 02/25/14 2108   02/25/14 0658  ceFAZolin (ANCEF) IVPB 2 g/50 mL premix  Status:  Discontinued     2 g 100 mL/hr over 30  Minutes Intravenous On call to O.R. 02/25/14 0659 02/25/14 1514   02/25/14 0600  ceFAZolin (ANCEF) IVPB 2 g/50 mL premix     2 g 100 mL/hr over 30 Minutes Intravenous On call to O.R. 02/24/14 1415 02/25/14 1020    .  Tolerated the procedure well.  Placed with a foley intraoperatively.  Given Ofirmev at induction and for 48 hours.    POD# 1: Vital signs were stable.  Patient denied Chest pain, shortness of breath, or calf pain.  Patient was started on Lovenox 30 mg subcutaneously twice daily at 8am.  Consults to PT, OT, and care management were made.  The patient was weight bearing as tolerated.  CPM was placed on the operative leg 0-90 degrees for 6-8 hours a day.  Incentive spirometry was taught.  Dressing was changed.  Hemovac was discontinued.     Continued  PT for ambulation and exercise program.  IV saline locked.  O2 discontinued.    The remainder of the hospital course was dedicated to ambulation and strengthening.   The patient was discharged on 1 day post op in  Good condition.  Blood products given:none  DIAGNOSTIC STUDIES: Recent vital signs: No data found.  Recent laboratory studies: No results for input(s): WBC, HGB, HCT, PLT in the last 168 hours. No results for input(s): NA, K, CL, CO2, BUN, CREATININE, GLUCOSE, CALCIUM in the last 168 hours. No results found for: INR, PROTIME   Recent Radiographic Studies :  No results found.  DISCHARGE INSTRUCTIONS: Discharge Instructions    CPM    Complete by:  As directed   Continuous passive motion machine (CPM):      Use the CPM from 0 to 90 for 6-8 hours per day.      You may increase by 10 per day.  You may break it up into 2 or 3 sessions per day.      Use CPM for 2 weeks or until you are told to stop.     Call MD / Call 911    Complete by:  As directed   If you experience chest pain or shortness of breath, CALL 911 and be transported to the hospital emergency room.  If you develope a fever above 101 F, pus (white  drainage) or increased drainage or redness at the wound, or calf pain, call your surgeon's office.     Change dressing    Complete by:  As directed   Change dressing on wednesday, then change the dressing daily with sterile 4 x 4 inch gauze dressing and apply TED hose.     Constipation Prevention    Complete by:  As directed   Drink plenty of fluids.  Prune juice may be helpful.  You may use a stool softener, such as Colace (over the counter) 100 mg twice a day.  Use MiraLax (over the counter) for constipation as needed.     Diet - low sodium heart healthy    Complete by:  As directed      Do not put a pillow under the knee. Place it under the heel.    Complete by:  As directed      Driving restrictions    Complete by:  As directed   No driving for 6 weeks     Increase activity slowly as tolerated    Complete by:  As directed      Lifting restrictions    Complete by:  As directed   No lifting for 6 weeks     TED hose    Complete by:  As directed   Use stockings (TED hose) for 2 weeks on botj leg(s).  You may remove them at night for sleeping.           DISCHARGE MEDICATIONS:     Medication List    TAKE these medications        BENEFIBER Powd  Take 0.5 packets by mouth daily at 3 pm.     enoxaparin 40 MG/0.4ML injection  Commonly known as:  LOVENOX  Inject 0.4 mLs (40 mg total) into the skin daily.     GLUCOSAMINE PO  Take 1 tablet by mouth daily.     ibuprofen 200 MG tablet  Commonly known as:  ADVIL,MOTRIN  Take 200 mg by mouth every 6 (six) hours as needed (pain).     methocarbamol 500 MG tablet  Commonly known as:  ROBAXIN  Take 1-2 tablets (500-1,000 mg total) by mouth every 6 (six) hours as needed for muscle spasms.     omeprazole 20 MG capsule  Commonly known as:  PRILOSEC  Take 20 mg by mouth daily.     oxyCODONE 5 MG immediate release tablet  Commonly known as:  Oxy IR/ROXICODONE  Take 1-2 tablets (5-10 mg total) by mouth every 4 (four) hours as needed  for breakthrough pain.     polyethylene glycol packet  Commonly known as:  MIRALAX / GLYCOLAX  Take 12.75 g by mouth at bedtime as needed (constipation). 3/4 capful     valsartan-hydrochlorothiazide 160-12.5 MG per tablet  Commonly known as:  DIOVAN-HCT  Take 0.5 tablets by mouth daily.        FOLLOW UP VISIT:       Follow-up Information    Follow up with Rudean Haskell, MD. Call on 03/12/2014.   Specialty:  Orthopedic Surgery   Contact information:   [place] [address] [phone]       Follow up with [facility]   Why:  They will contact you to schedule home therapy visits.   Contact information:   [address] [place] [address] [phone]       DISPOSITION: HOME   CONDITION:  Good   Avannah Decker 03/19/2014, 3:35 PM

## 2015-12-26 DIAGNOSIS — C50911 Malignant neoplasm of unspecified site of right female breast: Secondary | ICD-10-CM | POA: Diagnosis not present

## 2015-12-26 DIAGNOSIS — Z170 Estrogen receptor positive status [ER+]: Secondary | ICD-10-CM | POA: Diagnosis not present

## 2015-12-26 DIAGNOSIS — Z853 Personal history of malignant neoplasm of breast: Secondary | ICD-10-CM | POA: Diagnosis not present

## 2016-04-26 DIAGNOSIS — Z9013 Acquired absence of bilateral breasts and nipples: Secondary | ICD-10-CM | POA: Diagnosis not present

## 2016-04-26 DIAGNOSIS — Z170 Estrogen receptor positive status [ER+]: Secondary | ICD-10-CM | POA: Diagnosis not present

## 2016-04-26 DIAGNOSIS — Z853 Personal history of malignant neoplasm of breast: Secondary | ICD-10-CM | POA: Diagnosis not present

## 2016-10-26 DIAGNOSIS — Z853 Personal history of malignant neoplasm of breast: Secondary | ICD-10-CM

## 2017-04-26 DIAGNOSIS — Z853 Personal history of malignant neoplasm of breast: Secondary | ICD-10-CM | POA: Diagnosis not present

## 2017-04-26 DIAGNOSIS — Z9013 Acquired absence of bilateral breasts and nipples: Secondary | ICD-10-CM | POA: Diagnosis not present

## 2017-11-04 ENCOUNTER — Encounter: Payer: Self-pay | Admitting: Pulmonary Disease

## 2017-11-04 ENCOUNTER — Ambulatory Visit (INDEPENDENT_AMBULATORY_CARE_PROVIDER_SITE_OTHER): Payer: Medicare Other | Admitting: Pulmonary Disease

## 2017-11-04 VITALS — BP 124/78 | HR 87 | Ht 63.0 in | Wt 150.0 lb

## 2017-11-04 DIAGNOSIS — R0681 Apnea, not elsewhere classified: Secondary | ICD-10-CM | POA: Diagnosis not present

## 2017-11-04 NOTE — Patient Instructions (Signed)
History of witnessed apneas, snoring  Moderate to high probability of significant sleep disordered breathing  We will obtain a home sleep study  Treatment options as discussed  I will see you back in the office in about 3 months  Call with any significant concerns

## 2017-11-04 NOTE — Progress Notes (Signed)
Audrey [name]    [id number]    [birth year]  Primary Care Physician:Sistasis, Sylvester Harder, MD  Referring Physician: Raina Mina., MD [address] [place], [place] [address]  Chief complaint:    Patient being seen for witnessed apneas during endoscopy procedure  HPI:  She has had similar experiences in the past Family members have told her about snoring and also witnessed apneas  Occasionally with nonrestorative sleep Dry mouth in the mornings Occasional headaches Memory is relatively fine Occasional difficulty concentrating  Wakes up a few times during the night  No significant weight gain recently  No family history of sleep disordered breathing known to her  Never smoker, no pertinent occupational history  Outpatient Encounter Medications as of 11/04/2017  Medication Sig  . ibuprofen (ADVIL,MOTRIN) 200 MG tablet Take 200 mg by mouth every 6 (six) hours as needed (pain).  Marland Kitchen omeprazole (PRILOSEC) 20 MG capsule Take 20 mg by mouth daily.  . polyethylene glycol (MIRALAX / GLYCOLAX) packet Take 12.75 g by mouth at bedtime as needed (constipation). 3/4 capful  . [DISCONTINUED] enoxaparin (LOVENOX) 40 MG/0.4ML injection Inject 0.4 mLs (40 mg total) into the skin daily.  . [DISCONTINUED] Glucosamine HCl (GLUCOSAMINE PO) Take 1 tablet by mouth daily.  . [DISCONTINUED] methocarbamol (ROBAXIN) 500 MG tablet Take 1-2 tablets (500-1,000 mg total) by mouth every 6 (six) hours as needed for muscle spasms.  . [DISCONTINUED] oxyCODONE (OXY IR/ROXICODONE) 5 MG immediate release tablet Take 1-2 tablets (5-10 mg total) by mouth every 4 (four) hours as needed for breakthrough pain.  . [DISCONTINUED] valsartan-hydrochlorothiazide (DIOVAN-HCT) 160-12.5 MG per tablet Take 0.5 tablets by mouth daily.  . [DISCONTINUED] Wheat Dextrin (BENEFIBER) POWD Take 0.5 packets by mouth daily at 3 pm.   No facility-administered encounter medications on file as of 11/04/2017.     Allergies as  of 11/04/2017 - Review Complete 11/04/2017  Allergen Reaction Noted  . Aspirin Other (See Comments) 02/11/2014  . Codeine Nausea And Vomiting 02/11/2014  . Penicillins Rash 02/11/2014    Past Medical History:  Diagnosis Date  . Arthritis    "hands" (02/25/2014)  . Borderline type 2 diabetes mellitus   . Breast cancer, right breast ([place]) 1999  . GERD (gastroesophageal reflux disease)   . High cholesterol    "can't take the RX; makes my bones ache" (02/25/2014)  . History of hiatal hernia   . History of stomach ulcers   . Hypertension   . Migraines    hx  . PONV (postoperative nausea and vomiting)   . Shortness of breath dyspnea    w/ exertion     Past Surgical History:  Procedure Laterality Date  . APPENDECTOMY    . BREAST BIOPSY Right 1999  . BREAST LUMPECTOMY Right 1999  . DILATION AND CURETTAGE OF UTERUS    . PARTIAL KNEE ARTHROPLASTY Left 02/25/2014   Procedure: LEFT UNICOMPARTMENTAL (medial compartment) KNEE;  Surgeon: Vickey Huger, MD;  Location: [facility];  Service: Orthopedics;  Laterality: Left;  . REPLACEMENT UNICONDYLAR JOINT KNEE Left 02/25/2014  . TONSILLECTOMY    . VAGINAL HYSTERECTOMY      No family history on file.  Social History   Socioeconomic History  . Marital status: Widowed    Spouse name: Not on file  . Number of children: Not on file  . Years of education: Not on file  . Highest education level: Not on file  Occupational History  . Not on file  Social Needs  .  Financial resource strain: Not on file  . Food insecurity:    Worry: Not on file    Inability: Not on file  . Transportation needs:    Medical: Not on file    Non-medical: Not on file  Tobacco Use  . Smoking status: Current Some Day Smoker    Packs/day: 0.75    Years: 10.00    Pack years: 7.50    Types: Cigarettes    Start date: 11/05/2002    Last attempt to quit: 02/12/2011    Years since quitting: 6.7  . Smokeless tobacco: Never Used  Substance and Sexual Activity  . Alcohol  use: No  . Drug use: No  . Sexual activity: Never  Lifestyle  . Physical activity:    Days per week: Not on file    Minutes per session: Not on file  . Stress: Not on file  Relationships  . Social connections:    Talks on phone: Not on file    Gets together: Not on file    Attends religious service: Not on file    Active member of club or organization: Not on file    Attends meetings of clubs or organizations: Not on file    Relationship status: Not on file  . Intimate partner violence:    Fear of current or ex partner: Not on file    Emotionally abused: Not on file    Physically abused: Not on file    Forced sexual activity: Not on file  Other Topics Concern  . Not on file  Social History Narrative  . Not on file    Review of Systems  Constitutional: Negative.   HENT: Negative.   Respiratory: Positive for apnea. Negative for cough and shortness of breath.   Cardiovascular: Negative for chest pain.  Psychiatric/Behavioral: Positive for sleep disturbance.    Vitals:   11/04/17 1358  BP: 124/78  Pulse: 87  SpO2: 97%     Physical Exam  Constitutional: She appears well-developed and well-nourished.  HENT:  Head: Normocephalic and atraumatic.  Crowded oropharynx  Eyes: Pupils are equal, round, and reactive to light. Conjunctivae are normal. Right eye exhibits no discharge. Left eye exhibits no discharge.  Neck: Normal range of motion. Neck supple. No tracheal deviation present. No thyromegaly present.  Cardiovascular: Normal rate and regular rhythm.  Pulmonary/Chest: Effort normal and breath sounds normal. No respiratory distress. She has no wheezes.  Abdominal: Soft. Bowel sounds are normal. She exhibits no distension.  Musculoskeletal: Normal range of motion. She exhibits no edema.  Neurological: She is alert. No cranial nerve deficit. Coordination normal.  Skin: Skin is warm and dry. No erythema.  Psychiatric: She has a normal mood and affect. Her behavior is  normal.   Assessment:  History of significant snoring -Has had this for many years  Witnessed apneas -Noted by family and also during a recent endoscopy procedure  Moderate to high probability of significant sleep disordered breathing  Plan/Recommendations:  Pathophysiology of sleep disordered breathing discussed  Treatment options for sleep disordered breathing discussed  We will order a home sleep study  Sleep position was discussed, elevation of the head of the bed, lateral sleep encouraged  Regular physical activity encouraged  I will see her back in the office in 3 months   Sherrilyn Rist MD [facility] 11/04/2017, 2:09 PM  CC: Raina Mina., MD

## 2017-11-16 DIAGNOSIS — G4733 Obstructive sleep apnea (adult) (pediatric): Secondary | ICD-10-CM | POA: Diagnosis not present

## 2017-11-17 ENCOUNTER — Other Ambulatory Visit: Payer: Self-pay | Admitting: *Deleted

## 2017-11-17 DIAGNOSIS — R0681 Apnea, not elsewhere classified: Secondary | ICD-10-CM

## 2017-11-21 DIAGNOSIS — G4733 Obstructive sleep apnea (adult) (pediatric): Secondary | ICD-10-CM

## 2017-11-23 ENCOUNTER — Telehealth: Payer: Self-pay | Admitting: Pulmonary Disease

## 2017-11-23 NOTE — Telephone Encounter (Signed)
Dr. Ander Slade has reviewed the home sleep test this showed Mild Obstructive sleep apnea.   Recommendations   Treatment options are CPAP with the settings auto 5 to 15.    Mild sleep apnea if not very symptomatic may also be managed by weight loss and optimizing sleep positions, encouraging lateral sleep positioning and head elevation may help snoring and apneas.   Advise against driving while sleepy & against medication with sedative side effects.   Make appointment for 3 months for compliance with download with Dr. Ander Slade.   Patient has chose to not do a cpap at this time she will see [place] on 12/14/17 to see how she is doing.

## 2017-12-14 ENCOUNTER — Ambulatory Visit: Payer: Medicare Other | Admitting: Pulmonary Disease

## 2019-06-06 ENCOUNTER — Encounter: Payer: Self-pay | Admitting: Specialist

## 2019-06-20 ENCOUNTER — Encounter: Payer: Self-pay | Admitting: Pulmonary Disease

## 2019-06-20 ENCOUNTER — Other Ambulatory Visit: Payer: Self-pay

## 2019-06-20 ENCOUNTER — Ambulatory Visit: Payer: Medicare Other | Admitting: Pulmonary Disease

## 2019-06-20 VITALS — BP 120/68 | HR 62 | Temp 98.4°F | Ht 63.0 in | Wt 137.6 lb

## 2019-06-20 DIAGNOSIS — R9389 Abnormal findings on diagnostic imaging of other specified body structures: Secondary | ICD-10-CM

## 2019-06-20 NOTE — Progress Notes (Signed)
Audrey [name]    [id number]    1940/03/19  Primary Care Physician:Sistasis, Sylvester Harder, MD  Referring Physician: [name], MD [place],  [place] [address]  Chief complaint:    Patient being seen for abnormal CT scan of the chest showing a lung nodule HPI:  She stated she had a CT scan performed about 3 years ago showing a lung nodule The nodule was too small to be biopsied or any other intervention at the time  Recently had a repeat CT scan because she had not had one in a while, she insisted on having 1 Was not having any significant symptoms  Denies any chest pains or chest discomfort, no hemoptysis  About 8 pound weight loss that she has scribed to having Covid recently  She was seen by myself in the past for nonrestorative sleep, found to have mild obstructive sleep apnea with no significant daytime symptoms  Past history of breast cancer for which she had bilateral mastectomies  Outpatient Encounter Medications as of 06/20/2019  Medication Sig  . amLODipine (NORVASC) 2.5 MG tablet Take 2.5 mg by mouth daily.  Marland Kitchen ibuprofen (ADVIL,MOTRIN) 200 MG tablet Take 200 mg by mouth every 6 (six) hours as needed (pain).  Marland Kitchen omeprazole (PRILOSEC) 20 MG capsule Take 20 mg by mouth daily.  . [DISCONTINUED] polyethylene glycol (MIRALAX / GLYCOLAX) packet Take 12.75 g by mouth at bedtime as needed (constipation). 3/4 capful   No facility-administered encounter medications on file as of 06/20/2019.    Allergies as of 06/20/2019 - Review Complete 06/20/2019  Allergen Reaction Noted  . Aspirin Other (See Comments) 02/11/2014  . Codeine Nausea And Vomiting 02/11/2014  . Penicillins Rash 02/11/2014    Past Medical History:  Diagnosis Date  . Arthritis    "hands" (02/25/2014)  . Borderline type 2 diabetes mellitus   . Breast cancer, right breast ([place]) 1999  . GERD (gastroesophageal reflux disease)   . High cholesterol    "can't take the RX; makes my bones ache"  (02/25/2014)  . History of hiatal hernia   . History of stomach ulcers   . Hypertension   . Migraines    hx  . PONV (postoperative nausea and vomiting)   . Shortness of breath dyspnea    w/ exertion     Past Surgical History:  Procedure Laterality Date  . APPENDECTOMY    . BREAST BIOPSY Right 1999  . BREAST LUMPECTOMY Right 1999  . DILATION AND CURETTAGE OF UTERUS    . PARTIAL KNEE ARTHROPLASTY Left 02/25/2014   Procedure: LEFT UNICOMPARTMENTAL (medial compartment) KNEE;  Surgeon: Vickey Huger, MD;  Location: [name];  Service: Orthopedics;  Laterality: Left;  . REPLACEMENT UNICONDYLAR JOINT KNEE Left 02/25/2014  . TONSILLECTOMY    . VAGINAL HYSTERECTOMY      No family history on file.  Social History   Socioeconomic History  . Marital status: Widowed    Spouse name: Not on file  . Number of children: Not on file  . Years of education: Not on file  . Highest education level: Not on file  Occupational History  . Not on file  Tobacco Use  . Smoking status: Current Some Day Smoker    Packs/day: 0.75    Years: 10.00    Pack years: 7.50    Types: Cigarettes    Start date: 11/05/2002    Last attempt to quit: 02/12/2011    Years since quitting: 8.3  .  Smokeless tobacco: Never Used  Substance and Sexual Activity  . Alcohol use: No  . Drug use: No  . Sexual activity: Never  Other Topics Concern  . Not on file  Social History Narrative  . Not on file   Social Determinants of Health   Financial Resource Strain:   . Difficulty of Paying Living Expenses:   Food Insecurity:   . Worried About Charity fundraiser in the Last Year:   . Arboriculturist in the Last Year:   Transportation Needs:   . Film/video editor (Medical):   Marland Kitchen Lack of Transportation (Non-Medical):   Physical Activity:   . Days of Exercise per Week:   . Minutes of Exercise per Session:   Stress:   . Feeling of Stress :   Social Connections:   . Frequency of Communication with Friends and Family:     . Frequency of Social Gatherings with Friends and Family:   . Attends Religious Services:   . Active Member of Clubs or Organizations:   . Attends Archivist Meetings:   Marland Kitchen Marital Status:   Intimate Partner Violence:   . Fear of Current or Ex-Partner:   . Emotionally Abused:   Marland Kitchen Physically Abused:   . Sexually Abused:     Review of Systems  Constitutional: Negative.   HENT: Negative.   Respiratory: Positive for apnea. Negative for cough and shortness of breath.   Cardiovascular: Negative for chest pain.    Vitals:   06/20/19 1016  BP: 120/68  Pulse: 62  Temp: 98.4 F (36.9 C)  SpO2: 99%     Physical Exam  Constitutional: She appears well-developed and well-nourished.  HENT:  Head: Normocephalic and atraumatic.  Crowded oropharynx  Eyes: Pupils are equal, round, and reactive to light. Conjunctivae are normal. Right eye exhibits no discharge. Left eye exhibits no discharge.  Neck: No tracheal deviation present. No thyromegaly present.  Cardiovascular: Normal rate and regular rhythm.  Pulmonary/Chest: Effort normal and breath sounds normal. No respiratory distress. She has no wheezes.  Abdominal: Bowel sounds are normal.  Musculoskeletal:     Cervical back: Normal range of motion and neck supple.   CT scan result is not available to me at present Actual films is not available to be reviewed at present [facility] radiology on the computer and could not pull any data on her  Assessment:  Lung nodule-described as about 2 cm  Mild obstructive sleep apnea  Plan/Recommendations:  We will request for copy of the CT  Following review of the CT-further management decisions may be made at that time  Encouraged to call with any significant concerns  Sherrilyn Rist MD [facility] 06/20/2019, 11:01 AM  CC: [name], MD

## 2019-06-20 NOTE — Patient Instructions (Signed)
We will try and get a copy of your CT scan  We will get in touch with you as soon as it is reviewed to make arrangements regarding what needs done next  Call with significant concerns  Tentative appointment in 4 to 6 weeks

## 2019-06-21 ENCOUNTER — Telehealth: Payer: Self-pay | Admitting: Pulmonary Disease

## 2019-06-21 DIAGNOSIS — R911 Solitary pulmonary nodule: Secondary | ICD-10-CM

## 2019-06-21 NOTE — Telephone Encounter (Signed)
Received records from [facility] CT scan findings significant for a 2.6 enlarging spiculated mass within the superior segment of the right lower lobe corresponding to recent chest x-ray finding Lymph nodes in the axilla in the right axilla also enlarged with concern for metastasis  Mass was previously biopsied 2017  Concern is for a neoplastic process  Previous history of breast cancer    I did request for the actual films to be reviewed  I will order a PET scan  Biopsy will be appropriate following PET scan

## 2019-06-21 NOTE — Telephone Encounter (Signed)
Please order PET scan  Enlarging right lung mass, adenopathy History of breast cancer 3 years ago

## 2019-06-22 NOTE — Telephone Encounter (Signed)
Order placed Routing to [facility] as FYI to look out for doesn't state when to have done but would say next available sooner than leter

## 2019-06-22 NOTE — Telephone Encounter (Signed)
PET scan has been scheduled at [facility] on 07/04/2019 @ 11:00am LVM for patient to call the office for the appt information

## 2019-06-27 ENCOUNTER — Telehealth: Payer: Self-pay | Admitting: Pulmonary Disease

## 2019-06-27 DIAGNOSIS — R911 Solitary pulmonary nodule: Secondary | ICD-10-CM

## 2019-06-27 DIAGNOSIS — R590 Localized enlarged lymph nodes: Secondary | ICD-10-CM

## 2019-06-27 DIAGNOSIS — Z853 Personal history of malignant neoplasm of breast: Secondary | ICD-10-CM

## 2019-06-27 NOTE — Telephone Encounter (Signed)
CT scan of the chest reviewed  Shows a 2 cm right lower lobe mass lesion The mass is pleural-based and is best accessed via CT-guided biopsy  Patient also has axillary adenopathy  History of breast cancer 3 years ago  There is a possibility of dual malignancy has she is a smoker  Will refer to radiology-interventional radiology for CT-guided biopsy of right lung mass and possibly axillary lymph node sampling May require surgical excision of axillary node for sampling  PET scan already scheduled for 07/04/2019

## 2019-06-27 NOTE — Telephone Encounter (Signed)
CT guided biopsy order placed. Nothing further at this time.

## 2019-07-03 ENCOUNTER — Other Ambulatory Visit: Payer: Self-pay

## 2019-07-03 ENCOUNTER — Ambulatory Visit (HOSPITAL_COMMUNITY)
Admission: RE | Admit: 2019-07-03 | Discharge: 2019-07-03 | Disposition: A | Discharge status: Home / Self Care | Payer: Medicare Other | Source: Ambulatory Visit | Attending: Pulmonary Disease | Admitting: Pulmonary Disease

## 2019-07-03 DIAGNOSIS — R911 Solitary pulmonary nodule: Secondary | ICD-10-CM | POA: Diagnosis not present

## 2019-07-03 LAB — GLUCOSE, CAPILLARY: Glucose-Capillary: 85 mg/dL (ref 70–99)

## 2019-07-03 MED ORDER — FLUDEOXYGLUCOSE F - 18 (FDG) INJECTION
7.5000 | Freq: Once | INTRAVENOUS | Status: AC | PRN
  Administered 2019-07-03: 7.5 via INTRAVENOUS

## 2019-07-04 ENCOUNTER — Encounter (HOSPITAL_COMMUNITY): Payer: Self-pay | Admitting: Radiology

## 2019-07-04 NOTE — Progress Notes (Signed)
Audrey [name]. [name], 79 y.o., 29-Aug-1940 MRN:  [id number] Phone:  [phone] [name]) PCP:  [name], MD Coverage:  [facility] 08/07/2019 at 9:45 AM  RE: CT Biopsy Received: Today [name], MD  [place], [name] Ok   [place] core R ax LAN   DDH       Previous Messages   ----- Message -----  From: [name]  Sent: 07/04/2019  8:53 AM EDT  To: Ir Procedure Requests  Subject: CT Biopsy                     Procedure: CT Biopsy   Reason: Solitary pulmonary nodule, Axillary adenopathy, History of breast cancer, right lung mass, possible axillary lymph node sampling, May require surgical excision of axillary lymph node sampling   History: NM PET in computer   Provider: Sherrilyn Rist A   Provider Contact: [phone]

## 2019-07-10 ENCOUNTER — Other Ambulatory Visit (HOSPITAL_COMMUNITY)
Admission: RE | Admit: 2019-07-10 | Discharge: 2019-07-10 | Disposition: A | Discharge status: Home / Self Care | Payer: Medicare Other | Source: Ambulatory Visit | Attending: Pulmonary Disease | Admitting: Pulmonary Disease

## 2019-07-10 DIAGNOSIS — Z01812 Encounter for preprocedural laboratory examination: Secondary | ICD-10-CM | POA: Insufficient documentation

## 2019-07-10 DIAGNOSIS — Z20822 Contact with and (suspected) exposure to covid-19: Secondary | ICD-10-CM | POA: Insufficient documentation

## 2019-07-10 LAB — SARS CORONAVIRUS 2 (TAT 6-24 HRS): SARS Coronavirus 2: NEGATIVE

## 2019-07-11 ENCOUNTER — Other Ambulatory Visit: Payer: Self-pay | Admitting: Radiology

## 2019-07-12 ENCOUNTER — Other Ambulatory Visit: Payer: Self-pay | Admitting: Radiology

## 2019-07-13 ENCOUNTER — Encounter (HOSPITAL_COMMUNITY): Payer: Self-pay

## 2019-07-13 ENCOUNTER — Other Ambulatory Visit: Payer: Self-pay

## 2019-07-13 ENCOUNTER — Ambulatory Visit (HOSPITAL_COMMUNITY)
Admission: RE | Admit: 2019-07-13 | Discharge: 2019-07-13 | Disposition: A | Discharge status: Home / Self Care | Payer: Medicare Other | Source: Ambulatory Visit | Attending: Pulmonary Disease | Admitting: Pulmonary Disease

## 2019-07-13 DIAGNOSIS — Z886 Allergy status to analgesic agent status: Secondary | ICD-10-CM | POA: Insufficient documentation

## 2019-07-13 DIAGNOSIS — Z853 Personal history of malignant neoplasm of breast: Secondary | ICD-10-CM | POA: Diagnosis not present

## 2019-07-13 DIAGNOSIS — Z96652 Presence of left artificial knee joint: Secondary | ICD-10-CM | POA: Insufficient documentation

## 2019-07-13 DIAGNOSIS — G4733 Obstructive sleep apnea (adult) (pediatric): Secondary | ICD-10-CM | POA: Diagnosis not present

## 2019-07-13 DIAGNOSIS — M19042 Primary osteoarthritis, left hand: Secondary | ICD-10-CM | POA: Diagnosis not present

## 2019-07-13 DIAGNOSIS — Z8719 Personal history of other diseases of the digestive system: Secondary | ICD-10-CM | POA: Diagnosis not present

## 2019-07-13 DIAGNOSIS — K219 Gastro-esophageal reflux disease without esophagitis: Secondary | ICD-10-CM | POA: Diagnosis not present

## 2019-07-13 DIAGNOSIS — Z880 Allergy status to penicillin: Secondary | ICD-10-CM | POA: Insufficient documentation

## 2019-07-13 DIAGNOSIS — R590 Localized enlarged lymph nodes: Secondary | ICD-10-CM

## 2019-07-13 DIAGNOSIS — F17210 Nicotine dependence, cigarettes, uncomplicated: Secondary | ICD-10-CM | POA: Diagnosis not present

## 2019-07-13 DIAGNOSIS — R911 Solitary pulmonary nodule: Secondary | ICD-10-CM | POA: Diagnosis not present

## 2019-07-13 DIAGNOSIS — Z79899 Other long term (current) drug therapy: Secondary | ICD-10-CM | POA: Insufficient documentation

## 2019-07-13 DIAGNOSIS — K449 Diaphragmatic hernia without obstruction or gangrene: Secondary | ICD-10-CM | POA: Insufficient documentation

## 2019-07-13 DIAGNOSIS — Z9013 Acquired absence of bilateral breasts and nipples: Secondary | ICD-10-CM | POA: Diagnosis not present

## 2019-07-13 DIAGNOSIS — Z800 Family history of malignant neoplasm of digestive organs: Secondary | ICD-10-CM | POA: Diagnosis not present

## 2019-07-13 DIAGNOSIS — C773 Secondary and unspecified malignant neoplasm of axilla and upper limb lymph nodes: Secondary | ICD-10-CM | POA: Insufficient documentation

## 2019-07-13 DIAGNOSIS — Z885 Allergy status to narcotic agent status: Secondary | ICD-10-CM | POA: Diagnosis not present

## 2019-07-13 DIAGNOSIS — I10 Essential (primary) hypertension: Secondary | ICD-10-CM | POA: Insufficient documentation

## 2019-07-13 DIAGNOSIS — C50911 Malignant neoplasm of unspecified site of right female breast: Secondary | ICD-10-CM | POA: Insufficient documentation

## 2019-07-13 DIAGNOSIS — M19041 Primary osteoarthritis, right hand: Secondary | ICD-10-CM | POA: Diagnosis not present

## 2019-07-13 LAB — CBC WITH DIFFERENTIAL/PLATELET
Abs Immature Granulocytes: 0.05 10*3/uL (ref 0.00–0.07)
Basophils Absolute: 0.1 10*3/uL (ref 0.0–0.1)
Basophils Relative: 1 %
Eosinophils Absolute: 0.2 10*3/uL (ref 0.0–0.5)
Eosinophils Relative: 2 %
HCT: 40.1 % (ref 36.0–46.0)
Hemoglobin: 12.9 g/dL (ref 12.0–15.0)
Immature Granulocytes: 1 %
Lymphocytes Relative: 30 %
Lymphs Abs: 3 10*3/uL (ref 0.7–4.0)
MCH: 28.7 pg (ref 26.0–34.0)
MCHC: 32.2 g/dL (ref 30.0–36.0)
MCV: 89.1 fL (ref 80.0–100.0)
Monocytes Absolute: 0.5 10*3/uL (ref 0.1–1.0)
Monocytes Relative: 5 %
Neutro Abs: 6.3 10*3/uL (ref 1.7–7.7)
Neutrophils Relative %: 61 %
Platelets: 290 10*3/uL (ref 150–400)
RBC: 4.5 MIL/uL (ref 3.87–5.11)
RDW: 13.3 % (ref 11.5–15.5)
WBC: 10.1 10*3/uL (ref 4.0–10.5)
nRBC: 0 % (ref 0.0–0.2)

## 2019-07-13 LAB — BASIC METABOLIC PANEL
Anion gap: 9 (ref 5–15)
BUN: 13 mg/dL (ref 8–23)
CO2: 21 mmol/L — ABNORMAL LOW (ref 22–32)
Calcium: 8.8 mg/dL — ABNORMAL LOW (ref 8.9–10.3)
Chloride: 108 mmol/L (ref 98–111)
Creatinine, Ser: 0.9 mg/dL (ref 0.44–1.00)
GFR calc Af Amer: >60 mL/min (ref >60)
GFR calc non Af Amer: >60 mL/min (ref >60)
Glucose, Bld: 83 mg/dL (ref 70–99)
Potassium: 4.1 mmol/L (ref 3.5–5.1)
Sodium: 138 mmol/L (ref 135–145)

## 2019-07-13 LAB — PROTIME-INR
INR: 1 (ref 0.8–1.2)
Prothrombin Time: 12.6 seconds (ref 11.4–15.2)

## 2019-07-13 MED ORDER — FENTANYL CITRATE (PF) 100 MCG/2ML IJ SOLN
INTRAMUSCULAR | Status: AC | PRN
  Administered 2019-07-13: 25 ug via INTRAVENOUS

## 2019-07-13 MED ORDER — MIDAZOLAM HCL 2 MG/2ML IJ SOLN
INTRAMUSCULAR | Status: AC | PRN
  Administered 2019-07-13: 1 mg via INTRAVENOUS

## 2019-07-13 MED ORDER — LIDOCAINE HCL (PF) 1 % IJ SOLN
INTRAMUSCULAR | Status: AC
  Filled 2019-07-13: qty 30

## 2019-07-13 MED ORDER — SODIUM CHLORIDE 0.9 % IV SOLN: INTRAVENOUS | Status: DC

## 2019-07-13 MED ORDER — MIDAZOLAM HCL 2 MG/2ML IJ SOLN
INTRAMUSCULAR | Status: AC
  Filled 2019-07-13: qty 2

## 2019-07-13 MED ORDER — FENTANYL CITRATE (PF) 100 MCG/2ML IJ SOLN
INTRAMUSCULAR | Status: AC
  Filled 2019-07-13: qty 2

## 2019-07-13 NOTE — Procedures (Signed)
Interventional Radiology Procedure Note  Procedure: US guided right chest wall mass/node Complications: None EBL: None Recommendations: - 1 hours bedrest, then DC home - follow up pathology - advance diet - routine wound care  Signed,  Dulcy Fanny. Earleen Newport, DO

## 2019-07-13 NOTE — H&P (Signed)
Chief Complaint: Patient was seen in consultation today for axillary adenopathy.  Referring Physician(s): Olalere,Adewale A  Supervising Physician: Corrie Mckusick  Patient Status: [facility] out patient  History of Present Illness: Audrey [name] is a 79 y.o. female with a medical history that includes HTN, hiatal hernia, mild obstructive sleep apnea, right breast cancer (1999; bilateral mastectomies) and a known right lower lobe lung nodule. Results of recent CT-imaging done at [facility] prompted additional work-up.   PET Scan 07/03/19: 1. Hypermetabolic nodule in superior segment of the RIGHT lower lobe is highly concerning for malignancy. This nodule is increased in size and metabolic activity compared to PET-CT scan 2017. 2. Hypermetabolic soft tissue mass in the RIGHT axilla consistent with local breast cancer recurrence. 3. Small hypermetabolic nodes RIGHT axilla to the RIGHT neck lower posterior triangle consistent with metastatic adenopathy.  Interventional Radiology has been asked to evaluate this patient for a right axillary lymph node biopsy. This case was reviewed by Dr. Vernard Gambles.   Audrey [name] presents to [facility] for procedure today.  She is in her usual state of health. Denies new complaints or concerns.  She has been NPO as of 630AM.  She does not take blood thinners.    Past Medical History:  Diagnosis Date  . Arthritis    "hands" (02/25/2014)  . Borderline type 2 diabetes mellitus   . Breast cancer, right breast ([place]) 1999  . GERD (gastroesophageal reflux disease)   . High cholesterol    "can't take the RX; makes my bones ache" (02/25/2014)  . History of hiatal hernia   . History of stomach ulcers   . Hypertension   . Migraines    hx  . PONV (postoperative nausea and vomiting)   . Shortness of breath dyspnea    w/ exertion     Past Surgical History:  Procedure Laterality Date  . APPENDECTOMY    . BREAST BIOPSY Right 1999  . BREAST LUMPECTOMY Right  1999  . DILATION AND CURETTAGE OF UTERUS    . PARTIAL KNEE ARTHROPLASTY Left 02/25/2014   Procedure: LEFT UNICOMPARTMENTAL (medial compartment) KNEE;  Surgeon: Vickey Huger, MD;  Location: [place];  Service: Orthopedics;  Laterality: Left;  . REPLACEMENT UNICONDYLAR JOINT KNEE Left 02/25/2014  . TONSILLECTOMY    . VAGINAL HYSTERECTOMY      Allergies: Ativan [lorazepam], Aspirin, Codeine, and Penicillins  Medications: Prior to Admission medications   Medication Sig Start Date End Date Taking? Authorizing Provider  amLODipine (NORVASC) 2.5 MG tablet Take 2.5 mg by mouth daily. 05/16/19  Yes [provider]  ketotifen (ZADITOR) 0.025 % ophthalmic solution Place 1 drop into both eyes 2 (two) times daily as needed (dry eyes).   Yes [provider]  lansoprazole (PREVACID) 15 MG capsule Take 15 mg by mouth daily at 12 noon.   Yes [provider]     History reviewed. No pertinent family history.  Social History   Socioeconomic History  . Marital status: Widowed    Spouse name: Not on file  . Number of children: Not on file  . Years of education: Not on file  . Highest education level: Not on file  Occupational History  . Not on file  Tobacco Use  . Smoking status: Current Some Day Smoker    Packs/day: 0.75    Years: 10.00    Pack years: 7.50    Types: Cigarettes    Start date: 11/05/2002    Last attempt to quit: 02/12/2011  Years since quitting: 8.4  . Smokeless tobacco: Never Used  Substance and Sexual Activity  . Alcohol use: No  . Drug use: No  . Sexual activity: Never  Other Topics Concern  . Not on file  Social History Narrative  . Not on file   Social Determinants of Health   Financial Resource Strain:   . Difficulty of Paying Living Expenses:   Food Insecurity:   . Worried About Charity fundraiser in the Last Year:   . Arboriculturist in the Last Year:   Transportation Needs:   . Film/video editor (Medical):   Marland Kitchen Lack of  Transportation (Non-Medical):   Physical Activity:   . Days of Exercise per Week:   . Minutes of Exercise per Session:   Stress:   . Feeling of Stress :   Social Connections:   . Frequency of Communication with Friends and Family:   . Frequency of Social Gatherings with Friends and Family:   . Attends Religious Services:   . Active Member of Clubs or Organizations:   . Attends Archivist Meetings:   Marland Kitchen Marital Status:     Review of Systems: A 12 point ROS discussed and pertinent positives are indicated in the HPI above.  All other systems are negative.  Review of Systems  Constitutional: Negative for fatigue and fever.  Respiratory: Negative for cough and shortness of breath.   Cardiovascular: Negative for chest pain.  Gastrointestinal: Negative for abdominal pain.  Musculoskeletal: Negative for back pain.  Psychiatric/Behavioral: Negative for behavioral problems and confusion.    Vital Signs: BP (!) 181/59   Pulse 88   Temp 97.6 F (36.4 C) (Oral)   Resp 15   Ht 5\' 3"  (1.6 m)   Wt 137 lb (62.1 kg)   SpO2 99%   BMI 24.27 kg/m   Physical Exam Vitals and nursing note reviewed.  Constitutional:      General: She is not in acute distress.    Appearance: Normal appearance. She is not ill-appearing.  Cardiovascular:     Rate and Rhythm: Normal rate and regular rhythm.     Comments: Occasional extraneous beat, NSR Pulmonary:     Effort: Pulmonary effort is normal.     Breath sounds: Normal breath sounds.  Abdominal:     General: Abdomen is flat.     Palpations: Abdomen is soft.  Musculoskeletal:     Cervical back: Normal range of motion and neck supple.     Comments: No palpable adenopathy in the neck or axilla  Skin:    General: Skin is warm and dry.  Neurological:     General: No focal deficit present.     Mental Status: She is alert and oriented to person, place, and time. Mental status is at baseline.  Psychiatric:        Mood and Affect: Mood  normal.        Behavior: Behavior normal.        Thought Content: Thought content normal.        Judgment: Judgment normal.     Imaging: NM PET Image Initial (PI) Skull Base To Thigh  Result Date: 07/03/2019 CLINICAL DATA:  Initial treatment strategy for pulmonary nodule. History of breast cancer. EXAM: NUCLEAR MEDICINE PET SKULL BASE TO THIGH TECHNIQUE: 7.5 mCi F-18 FDG was injected intravenously. Full-ring PET imaging was performed from the skull base to thigh after the radiotracer. CT data was obtained and used for attenuation correction and  anatomic localization. Fasting blood glucose: 85 mg/dl COMPARISON:  None. FINDINGS: Mediastinal blood pool activity: SUV max 2.5 Liver activity: SUV max NA NECK: Several small hypermetabolic nodules in the posterior triangle of the RIGHT neck just above the clavicle with SUV max equal 4.9. These small nodes are less than 10 mm. Incidental CT findings: none CHEST: Hypermetabolic mass in the RIGHT axilla adjacent to lymphadenectomy clips measures 2.5 cm with SUV max equal 23.9 (image 49). This mass abuts the chest wall. Several hypermetabolic lymph nodes superiorly deep to the RIGHT pectoralis muscle (image 41). No hypermetabolic mediastinal nodes. No hypermetabolic internal mammary nodes. Within the superior segment of the RIGHT lower lobe ovoid nodule measures 2.5 cm with intense metabolic activity (SUV max equal 7.2). This RIGHT lower lobe nodule is increased in size and metabolic activity compared to PET-CT scan 2017 where nodule measured 2.0 cm with SUV max equal 3.1. No additional hypermetabolic nodules in the LEFT or RIGHT lung. Incidental CT findings: none ABDOMEN/PELVIS: No abnormal hypermetabolic activity within the liver, pancreas, adrenal glands, or spleen. No hypermetabolic lymph nodes in the abdomen or pelvis. Incidental CT findings: LEFT colon diverticulosis. Post hysterectomy. Adnexa unremarkable SKELETON: No focal hypermetabolic activity to suggest  skeletal metastasis. Incidental CT findings: none IMPRESSION: 1. Hypermetabolic nodule in superior segment of the RIGHT lower lobe is highly concerning for malignancy. This nodule is increased in size and metabolic activity compared to PET-CT scan 2017. 2. Hypermetabolic soft tissue mass in the RIGHT axilla consistent with local breast cancer recurrence. 3. Small hypermetabolic nodes RIGHT axilla to the RIGHT neck lower posterior triangle consistent with metastatic adenopathy. Electronically Signed   By: Suzy Bouchard M.D.   On: 07/03/2019 15:55    Labs:  CBC: Recent Labs    07/13/19 1130  WBC 10.1  HGB 12.9  HCT 40.1  PLT 290    COAGS: Recent Labs    07/13/19 1130  INR 1.0    BMP: Recent Labs    07/13/19 1130  NA 138  K 4.1  CL 108  CO2 21*  GLUCOSE 83  BUN 13  CALCIUM 8.8*  CREATININE 0.90  GFRNONAA >60  GFRAA >60    LIVER FUNCTION TESTS: No results for input(s): BILITOT, AST, ALT, ALKPHOS, PROT, ALBUMIN in the last 8760 hours.  TUMOR MARKERS: No results for input(s): AFPTM, CEA, CA199, CHROMGRNA in the last 8760 hours.  Assessment and Plan:  Right lower lobe lung nodule; right axillary adenopathy; history of breast cancer:  Audrey [name]. Audrey [name], 79 year old female, presents today to the [facility] Interventional Radiology department for a right axillary lymph node biopsy. She has been NPO.   Case reviewed and approved by Dr. Vernard Gambles.   Risks and benefits of a lymph node biopsy were discussed with the patient and/or patient's family including, but not limited to bleeding, infection, damage to adjacent structures or low yield requiring additional tests.  All of the questions were answered and there is agreement to proceed.  Consent signed and in chart.  Thank you for this interesting consult.  I greatly enjoyed meeting Audrey [name] and look forward to participating in their care.  A copy of this report was sent to the requesting provider on this  date.  Brynda Greathouse, MS RD PA-C   I spent a total of  30 Minutes   in face to face in clinical consultation, greater than 50% of which was counseling/coordinating care for right axillary lymph node biopsy.

## 2019-07-13 NOTE — Discharge Instructions (Addendum)
Needle Biopsy, Care After These instructions tell you how to care for yourself after your procedure. Your doctor may also give you more specific instructions. Call your doctor if you have any problems or questions. What can I expect after the procedure? After the procedure, it is common to have:  Soreness.  Bruising.  Mild pain. Follow these instructions at home:   Return to your normal activities as told by your doctor. Ask your doctor what activities are safe for you.  Take over-the-counter and prescription medicines only as told by your doctor.  Wash your hands with soap and water before you change your bandage (dressing). If you cannot use soap and water, use hand sanitizer.  Follow instructions from your doctor about: ? How to take care of your puncture site. ? When and how to change your bandage. ? When to remove your bandage.  Check your puncture site every day for signs of infection. Watch for: ? Redness, swelling, or pain. ? Fluid or blood. ? Pus or a bad smell. ? Warmth.  Do not take baths, swim, or use a hot tub until your doctor approves. Ask your doctor if you may take showers. You may only be allowed to take sponge baths.  Keep all follow-up visits as told by your doctor. This is important. Contact a doctor if you have:  A fever.  Redness, swelling, or pain at the puncture site, and it lasts longer than a few days.  Fluid, blood, or pus coming from the puncture site.  Warmth coming from the puncture site. Get help right away if:  You have a lot of bleeding from the puncture site. Summary  After the procedure, it is common to have soreness, bruising, or mild pain at the puncture site.  Check your puncture site every day for signs of infection, such as redness, swelling, or pain.  Get help right away if you have severe bleeding from your puncture site. This information is not intended to replace advice given to you by your health care provider. Make  sure you discuss any questions you have with your health care provider. Document Revised: 01/10/2017 Document Reviewed: 01/10/2017 Elsevier Patient Education  [place]. Moderate Conscious Sedation, Adult Sedation is the use of medicines to promote relaxation and relieve discomfort and anxiety. Moderate conscious sedation is a type of sedation. Under moderate conscious sedation, you are less alert than normal, but you are still able to respond to instructions, touch, or both. Moderate conscious sedation is used during short medical and dental procedures. It is milder than deep sedation, which is a type of sedation under which you cannot be easily woken up. It is also milder than general anesthesia, which is the use of medicines to make you unconscious. Moderate conscious sedation allows you to return to your regular activities sooner. Tell a health care provider about:  Any allergies you have.  All medicines you are taking, including vitamins, herbs, eye drops, creams, and over-the-counter medicines.  Use of steroids (by mouth or creams).  Any problems you or family members have had with sedatives and anesthetic medicines.  Any blood disorders you have.  Any surgeries you have had.  Any medical conditions you have, such as sleep apnea.  Whether you are pregnant or may be pregnant.  Any use of cigarettes, alcohol, marijuana, or street drugs. What are the risks? Generally, this is a safe procedure. However, problems may occur, including:  Getting too much medicine (oversedation).  Nausea.  Allergic reaction to  medicines.  Trouble breathing. If this happens, a breathing tube may be used to help with breathing. It will be removed when you are awake and breathing on your own.  Heart trouble.  Lung trouble. What happens before the procedure? Staying hydrated Follow instructions from your health care provider about hydration, which may include:  Up to 2 hours before the  procedure - you may continue to drink clear liquids, such as water, clear fruit juice, black coffee, and plain tea. Eating and drinking restrictions Follow instructions from your health care provider about eating and drinking, which may include:  8 hours before the procedure - stop eating heavy meals or foods such as meat, fried foods, or fatty foods.  6 hours before the procedure - stop eating light meals or foods, such as toast or cereal.  6 hours before the procedure - stop drinking milk or drinks that contain milk.  2 hours before the procedure - stop drinking clear liquids. Medicine Ask your health care provider about:  Changing or stopping your regular medicines. This is especially important if you are taking diabetes medicines or blood thinners.  Taking medicines such as aspirin and ibuprofen. These medicines can thin your blood. Do not take these medicines before your procedure if your health care provider instructs you not to.  Tests and exams  You will have a physical exam.  You may have blood tests done to show: ? How well your kidneys and liver are working. ? How well your blood can clot. General instructions  Plan to have someone take you home from the hospital or clinic.  If you will be going home right after the procedure, plan to have someone with you for 24 hours. What happens during the procedure?  An IV tube will be inserted into one of your veins.  Medicine to help you relax (sedative) will be given through the IV tube.  The medical or dental procedure will be performed. What happens after the procedure?  Your blood pressure, heart rate, breathing rate, and blood oxygen level will be monitored often until the medicines you were given have worn off.  Do not drive for 24 hours. This information is not intended to replace advice given to you by your health care provider. Make sure you discuss any questions you have with your health care provider. Document  Revised: 12/10/2016 Document Reviewed: 04/19/2015 Elsevier Patient Education  2020 [employer].

## 2019-07-18 ENCOUNTER — Institutional Professional Consult (permissible substitution): Payer: Medicare Other | Admitting: Critical Care Medicine

## 2019-08-06 ENCOUNTER — Telehealth (HOSPITAL_COMMUNITY): Payer: Self-pay | Admitting: Pulmonary Disease

## 2019-08-06 DIAGNOSIS — C50919 Malignant neoplasm of unspecified site of unspecified female breast: Secondary | ICD-10-CM

## 2019-08-06 NOTE — Telephone Encounter (Signed)
Laurin Coder, MD  [facility] Triage Pool 6 hours ago (9:41 AM)   Schedule patient for oncology evaluation ASAP   Biopsy of axillary/chest wall significant for carcinoma consistent with breast primary  Was treated for breast cancer in the past   Routing comment     Referral was made to oncology marked urgent

## 2019-08-07 ENCOUNTER — Other Ambulatory Visit: Payer: Self-pay

## 2019-08-07 ENCOUNTER — Ambulatory Visit: Payer: Medicare Other | Admitting: Pulmonary Disease

## 2019-08-07 ENCOUNTER — Encounter: Payer: Self-pay | Admitting: Pulmonary Disease

## 2019-08-07 VITALS — BP 136/80 | Temp 98.1°F | Ht 63.0 in | Wt 140.0 lb

## 2019-08-07 DIAGNOSIS — Z853 Personal history of malignant neoplasm of breast: Secondary | ICD-10-CM | POA: Diagnosis not present

## 2019-08-07 DIAGNOSIS — R9389 Abnormal findings on diagnostic imaging of other specified body structures: Secondary | ICD-10-CM

## 2019-08-07 DIAGNOSIS — R590 Localized enlarged lymph nodes: Secondary | ICD-10-CM | POA: Diagnosis not present

## 2019-08-07 DIAGNOSIS — R911 Solitary pulmonary nodule: Secondary | ICD-10-CM

## 2019-08-07 NOTE — Patient Instructions (Signed)
Metastatic breast cancer  We did call to request for a referral to oncology  Usually they will call you to confirm an appointment We will also take a look at it again on our end to ensure you are seen a soon as possible  Tentatively, I will see you back in about 3 months

## 2019-08-07 NOTE — Progress Notes (Signed)
Audrey [name]    [id number]    [birth year]  Primary Care Physician:Sistasis, Sylvester Harder, MD  Referring Physician: [name], MD [place],  [place] [address]  Chief complaint:    Patient being seen for abnormal CT scan of the chest showing a lung nodule HPI:  She stated she had a CT scan performed about 3 years ago showing a lung nodule The nodule was too small to be biopsied or any other intervention at the time  Recently had a repeat CT scan because she had not had one in a while, she insisted on having 1 Was not having any significant symptoms  CT did show increase in the size of the lesion Did show adenopathy in the axilla  She had a CT-guided biopsy-lymph node showing metastatic breast cancer   She does have some chest discomfort related to biopsy  Weight is stable, appetite is stable  She was seen by myself in the past for nonrestorative sleep, found to have mild obstructive sleep apnea with no significant daytime symptoms  Past history of breast cancer for which she had bilateral mastectomies  Outpatient Encounter Medications as of 08/07/2019  Medication Sig  . amLODipine (NORVASC) 2.5 MG tablet Take 2.5 mg by mouth daily.  Marland Kitchen ketotifen (ZADITOR) 0.025 % ophthalmic solution Place 1 drop into both eyes 2 (two) times daily as needed (dry eyes).  . lansoprazole (PREVACID) 15 MG capsule Take 15 mg by mouth daily at 12 noon.   No facility-administered encounter medications on file as of 08/07/2019.    Allergies as of 08/07/2019 - Review Complete 08/07/2019  Allergen Reaction Noted  . Ativan [lorazepam] Itching 07/13/2019  . Aspirin Other (See Comments) 02/11/2014  . Codeine Nausea And Vomiting 02/11/2014  . Penicillins Rash 02/11/2014    Past Medical History:  Diagnosis Date  . Arthritis    "hands" (02/25/2014)  . Borderline type 2 diabetes mellitus   . Breast cancer, right breast ([place]) 1999  . GERD (gastroesophageal reflux disease)   .  High cholesterol    "can't take the RX; makes my bones ache" (02/25/2014)  . History of hiatal hernia   . History of stomach ulcers   . Hypertension   . Migraines    hx  . PONV (postoperative nausea and vomiting)   . Shortness of breath dyspnea    w/ exertion     Past Surgical History:  Procedure Laterality Date  . APPENDECTOMY    . BREAST BIOPSY Right 1999  . BREAST LUMPECTOMY Right 1999  . DILATION AND CURETTAGE OF UTERUS    . PARTIAL KNEE ARTHROPLASTY Left 02/25/2014   Procedure: LEFT UNICOMPARTMENTAL (medial compartment) KNEE;  Surgeon: Vickey Huger, MD;  Location: [place];  Service: Orthopedics;  Laterality: Left;  . REPLACEMENT UNICONDYLAR JOINT KNEE Left 02/25/2014  . TONSILLECTOMY    . VAGINAL HYSTERECTOMY      No family history on file.  Social History   Socioeconomic History  . Marital status: Widowed    Spouse name: Not on file  . Number of children: Not on file  . Years of education: Not on file  . Highest education level: Not on file  Occupational History  . Not on file  Tobacco Use  . Smoking status: Current Some Day Smoker    Packs/day: 0.75    Years: 10.00    Pack years: 7.50    Types: Cigarettes    Start date: 11/05/2002    Last  attempt to quit: 02/12/2011    Years since quitting: 8.4  . Smokeless tobacco: Never Used  Substance and Sexual Activity  . Alcohol use: No  . Drug use: No  . Sexual activity: Never  Other Topics Concern  . Not on file  Social History Narrative  . Not on file   Social Determinants of Health   Financial Resource Strain:   . Difficulty of Paying Living Expenses:   Food Insecurity:   . Worried About Charity fundraiser in the Last Year:   . Arboriculturist in the Last Year:   Transportation Needs:   . Film/video editor (Medical):   Marland Kitchen Lack of Transportation (Non-Medical):   Physical Activity:   . Days of Exercise per Week:   . Minutes of Exercise per Session:   Stress:   . Feeling of Stress :   Social  Connections:   . Frequency of Communication with Friends and Family:   . Frequency of Social Gatherings with Friends and Family:   . Attends Religious Services:   . Active Member of Clubs or Organizations:   . Attends Archivist Meetings:   Marland Kitchen Marital Status:   Intimate Partner Violence:   . Fear of Current or Ex-Partner:   . Emotionally Abused:   Marland Kitchen Physically Abused:   . Sexually Abused:     Review of Systems  Constitutional: Negative.   HENT: Negative.   Respiratory: Positive for apnea. Negative for cough and shortness of breath.   Cardiovascular: Negative for chest pain.    Vitals:   08/07/19 0948  BP: (!) 136/80  Temp: 98.1 F (36.7 C)  SpO2: 100%     Physical Exam Constitutional:      Appearance: She is well-developed.  HENT:     Head: Normocephalic and atraumatic.  Eyes:     General:        Right eye: No discharge.        Left eye: No discharge.     Conjunctiva/sclera: Conjunctivae normal.     Pupils: Pupils are equal, round, and reactive to light.  Neck:     Thyroid: No thyromegaly.     Trachea: No tracheal deviation.  Cardiovascular:     Rate and Rhythm: Normal rate and regular rhythm.  Pulmonary:     Effort: Pulmonary effort is normal. No respiratory distress.     Breath sounds: Normal breath sounds. No wheezing.  Musculoskeletal:     Cervical back: Normal range of motion and neck supple.    Previous CT was reviewed PET scan was reviewed with the patient-both current and the previous one performed in 2017 Biopsy results reviewed  Assessment:  Lung nodule, pleural-based  Axillary adenopathy  Mild obstructive sleep apnea  CT biopsy showing metastatic breast cancer  Plan/Recommendations:  Referral to oncology  Encouraged to call with any significant concerns  I will see her back in 3 months    Sherrilyn Rist MD [facility] 08/07/2019, 10:04 AM  CC: [name], MD

## 2019-08-09 ENCOUNTER — Telehealth: Payer: Self-pay | Admitting: Pulmonary Disease

## 2019-08-09 NOTE — Telephone Encounter (Signed)
Called and spoke with pt letting her know that the order needed to be corrected as [facility] could not see the order that was placed the day of her OV. Stated to her that she should hopefully be receiving a call from oncology soon and she verbalized understanding. Nothing further needed.

## 2019-08-09 NOTE — Telephone Encounter (Signed)
Referral to oncology was placed at last OV. [facility], please advise.

## 2019-08-09 NOTE — Telephone Encounter (Signed)
This order was not placed in the correct WQ so [facility] never received it

## 2019-08-10 ENCOUNTER — Telehealth: Payer: Self-pay | Admitting: Hematology and Oncology

## 2019-08-10 ENCOUNTER — Telehealth: Payer: Self-pay | Admitting: Pulmonary Disease

## 2019-08-10 NOTE — Telephone Encounter (Signed)
Called and spoke with Seth Bake at [facility]. She stated that the breast navigator stated that pt needs to be scheduled for an urgent mammogram and ultrasound and was wondering if Dr. Jenetta Downer would be okay ordering these for pt since he placed the oncology referral.  Dr. Jenetta Downer, please advise if you are okay with [place] ordering the urgent mammogram and ultrasound to be performed on pt or if we need to see if pt has a GYN for them to order this.

## 2019-08-10 NOTE — Telephone Encounter (Signed)
I received an urgent referral from Dr. Ander Slade at [facility] for a new dx of metastatic breast cancer. Ms. Fantroy has been cld and scheduled to see Dr. Lindi Adie on 8/3 at 1pm. She's aware to arrive 15 minutes early.  I spoke to our breast navigator Dawn who has advised the pt needs an urgent mammogram and ultrasound. I cld and spoke to Kim at [facility] to have their office order the urgent imaging at the breast center.

## 2019-08-13 LAB — SURGICAL PATHOLOGY

## 2019-08-13 NOTE — Progress Notes (Signed)
[facility] CONSULT NOTE  Patient Care Team: [name], MD as PCP - General (Family Medicine)  CHIEF COMPLAINTS/PURPOSE OF CONSULTATION:  Newly diagnosed recurrent breast cancer  HISTORY OF PRESENTING ILLNESS:  Audrey [name] 79 y.o. female is here because of a history of right breast cancer on 2017. Chest CT on 06/06/19 showed a mass in the right axilla and a mass in the right lower lung lobe. PET scan on 07/03/19 showed a right lower lobe pulmonary nodule concerning for malignancy, the right axillary mass consistent with local breast cancer recurrence, and small hypermetabolic right axillary and neck nodes consistent with metastatic adenopathy. Biopsy of the right axilla on 07/13/19 showed carcinoma consistent with breast carcinoma, HER-2 negative (1+), ER+ 95%, PR+ 75%, Ki67 25%. She presents to the clinic today for initial evaluation.   I reviewed her records extensively and collaborated the history with the patient.  SUMMARY OF ONCOLOGIC HISTORY: Oncology History  Malignant neoplasm of upper-outer quadrant of right breast in female, estrogen receptor positive ([place])  07/13/2019 Initial Diagnosis   History of right breast cancer on 2017. Chest CT on 06/06/19 showed a mass in the right axilla and a mass in the right lower lung lobe. PET scan on 07/03/19 showed a right lower lobe pulmonary nodule concerning for malignancy, the right axillary mass consistent with local breast cancer recurrence, and small hypermetabolic right axillary and neck nodes consistent with metastatic adenopathy. Biopsy of the right axilla on 07/13/19 showed carcinoma consistent with breast carcinoma, HER-2 negative (1+), ER+ 95%, PR+ 75%, Ki67 25%.     MEDICAL HISTORY:  Past Medical History:  Diagnosis Date  . Arthritis    "hands" (02/25/2014)  . Borderline type 2 diabetes mellitus   . Breast cancer, right breast ([place]) 1999  . GERD (gastroesophageal reflux disease)   . High cholesterol    "can't  take the RX; makes my bones ache" (02/25/2014)  . History of hiatal hernia   . History of stomach ulcers   . Hypertension   . Migraines    hx  . PONV (postoperative nausea and vomiting)   . Shortness of breath dyspnea    w/ exertion     SURGICAL HISTORY: Past Surgical History:  Procedure Laterality Date  . APPENDECTOMY    . BREAST BIOPSY Right 1999  . BREAST LUMPECTOMY Right 1999  . DILATION AND CURETTAGE OF UTERUS    . PARTIAL KNEE ARTHROPLASTY Left 02/25/2014   Procedure: LEFT UNICOMPARTMENTAL (medial compartment) KNEE;  Surgeon: Vickey Huger, MD;  Location: [facility];  Service: Orthopedics;  Laterality: Left;  . REPLACEMENT UNICONDYLAR JOINT KNEE Left 02/25/2014  . TONSILLECTOMY    . VAGINAL HYSTERECTOMY      SOCIAL HISTORY: Social History   Socioeconomic History  . Marital status: Widowed    Spouse name: Not on file  . Number of children: Not on file  . Years of education: Not on file  . Highest education level: Not on file  Occupational History  . Not on file  Tobacco Use  . Smoking status: Current Some Day Smoker    Packs/day: 0.75    Years: 10.00    Pack years: 7.50    Types: Cigarettes    Start date: 11/05/2002    Last attempt to quit: 02/12/2011    Years since quitting: 8.5  . Smokeless tobacco: Never Used  Substance and Sexual Activity  . Alcohol use: No  . Drug use: No  . Sexual activity: Never  Other Topics Concern  .  Not on file  Social History Narrative  . Not on file   Social Determinants of Health   Financial Resource Strain:   . Difficulty of Paying Living Expenses:   Food Insecurity:   . Worried About Charity fundraiser in the Last Year:   . Arboriculturist in the Last Year:   Transportation Needs:   . Film/video editor (Medical):   Marland Kitchen Lack of Transportation (Non-Medical):   Physical Activity:   . Days of Exercise per Week:   . Minutes of Exercise per Session:   Stress:   . Feeling of Stress :   Social Connections:   . Frequency of  Communication with Friends and Family:   . Frequency of Social Gatherings with Friends and Family:   . Attends Religious Services:   . Active Member of Clubs or Organizations:   . Attends Archivist Meetings:   Marland Kitchen Marital Status:   Intimate Partner Violence:   . Fear of Current or Ex-Partner:   . Emotionally Abused:   Marland Kitchen Physically Abused:   . Sexually Abused:     FAMILY HISTORY: No family history on file.  ALLERGIES:  is allergic to ativan [lorazepam], aspirin, codeine, and penicillins.  MEDICATIONS:  Current Outpatient Medications  Medication Sig Dispense Refill  . amLODipine (NORVASC) 2.5 MG tablet Take 2.5 mg by mouth daily.    Marland Kitchen ketotifen (ZADITOR) 0.025 % ophthalmic solution Place 1 drop into both eyes 2 (two) times daily as needed (dry eyes).    . lansoprazole (PREVACID) 15 MG capsule Take 15 mg by mouth daily at 12 noon.     No current facility-administered medications for this visit.    REVIEW OF SYSTEMS:   Constitutional: Denies fevers, chills or abnormal night sweats Eyes: Denies blurriness of vision, double vision or watery eyes Ears, nose, mouth, throat, and face: Denies mucositis or sore throat Respiratory: Denies cough, dyspnea or wheezes Cardiovascular: Denies palpitation, chest discomfort or lower extremity swelling Gastrointestinal:  Denies nausea, heartburn or change in bowel habits Skin: Denies abnormal skin rashes Lymphatics: Denies new lymphadenopathy or easy bruising Neurological:Denies numbness, tingling or new weaknesses Behavioral/Psych: Mood is stable, no new changes  Breast: Denies any palpable lumps or discharge All other systems were reviewed with the patient and are negative.  PHYSICAL EXAMINATION: ECOG PERFORMANCE STATUS: 1 - Symptomatic but completely ambulatory  Vitals:   08/14/19 1318  BP: (!) 160/64  Pulse: 93  Resp: 16  Temp: 98.6 F (37 C)  SpO2: 98%   Filed Weights   08/14/19 1318  Weight: 138 lb 9.6 oz (62.9 kg)      GENERAL:alert, no distress and comfortable SKIN: skin color, texture, turgor are normal, no rashes or significant lesions EYES: normal, conjunctiva are pink and non-injected, sclera clear OROPHARYNX:no exudate, no erythema and lips, buccal mucosa, and tongue normal  NECK: supple, thyroid normal size, non-tender, without nodularity LYMPH:  no palpable lymphadenopathy in the cervical, axillary or inguinal LUNGS: clear to auscultation and percussion with normal breathing effort HEART: regular rate & rhythm and no murmurs and no lower extremity edema ABDOMEN:abdomen soft, non-tender and normal bowel sounds Musculoskeletal:no cyanosis of digits and no clubbing  PSYCH: alert & oriented x 3 with fluent speech NEURO: no focal motor/sensory deficits BREAST: Palpable right axillary soft tissue mass. No palpable axillary or supraclavicular lymphadenopathy (exam performed in the presence of a chaperone)   LABORATORY DATA:  I have reviewed the data as listed Lab Results  Component Value  Date   WBC 10.1 07/13/2019   HGB 12.9 07/13/2019   HCT 40.1 07/13/2019   MCV 89.1 07/13/2019   PLT 290 07/13/2019   Lab Results  Component Value Date   NA 138 07/13/2019   K 4.1 07/13/2019   CL 108 07/13/2019   CO2 21 (L) 07/13/2019    RADIOGRAPHIC STUDIES: I have personally reviewed the radiological reports and agreed with the findings in the report.  ASSESSMENT AND PLAN:  Malignant neoplasm of upper-outer quadrant of right breast in female, estrogen receptor positive ([place]) 07/13/2019 H/O right breast cancer on 2017 and 1999: Lumpectomy.  (Bilateral mastectomies in 2017: Seen by Dr. Lavera Guise, could not tolerate antiestrogen therapy) Chest CT on 06/06/19 showed a mass in the right axilla and a mass in the right lower lung lobe.  PET scan on 07/03/19 showed a right lower lobe pulmonary nodule concerning for malignancy, the right axillary mass consistent with local breast cancer recurrence, and small  hypermetabolic right axillary and neck nodes consistent with metastatic adenopathy.  Biopsy of the right axilla on 07/13/19 showed carcinoma consistent with breast carcinoma, HER-2 negative (1+), ER+ 95%, PR+ 75%, Ki67 25%.  Pathology counseling: I discussed with the patient the findings on the pathology report in great detail and provided her with a copy of this report.  I discussed with her that if the lung masses breast cancer related that it would be considered to be metastatic disease.  Treatment plan: 1.  I discussed with radiology at length about the CT T and PET scan findings.  Dr. Kathlene Cote noted that she had the same area biopsied in 2017 and it was benign.  The SUV on the lung mass is also only 3.  It has not changed significantly since 2017 and therefore we decided to watch and monitor it and treat her as if the lung mass is not a metastatic breast cancer deposit.  2. initiate antiestrogen therapy with anastrozole today. 3.  We will present her in the tumor board and discuss role of surgery.  Goals of treatment: We can treat her for the curative intent antiestrogen therapy and surgery.  Ibrance: I discussed the pros and cons of adding Ibrance and determined that it would not be necessary at this time.  If we have clear-cut evidence of distant metastatic disease then we can add Ibrance.  Pain issues: Tylenol is not working.  I sent Percocet 2.5 mg to be taken as needed.  We discussed the role of radiation in alleviating pain.  I will discuss this with radiation oncology.  Return to clinic in 2 weeks for labs and follow-up I left a voicemail at her home discussing that we are not going to do the lung biopsy and we will discuss in the tumor board.     All questions were answered. The patient knows to call the clinic with any problems, questions or concerns.   Rulon Eisenmenger, MD, MPH 08/14/2019    I, Molly Dorshimer, am acting as scribe for Nicholas Lose, MD.  I have reviewed the  above documentation for accuracy and completeness, and I agree with the above.

## 2019-08-14 ENCOUNTER — Other Ambulatory Visit: Payer: Self-pay

## 2019-08-14 ENCOUNTER — Inpatient Hospital Stay: Payer: Medicare Other | Attending: Hematology and Oncology | Admitting: Hematology and Oncology

## 2019-08-14 ENCOUNTER — Other Ambulatory Visit: Payer: Self-pay | Admitting: Hematology and Oncology

## 2019-08-14 DIAGNOSIS — C50411 Malignant neoplasm of upper-outer quadrant of right female breast: Secondary | ICD-10-CM

## 2019-08-14 DIAGNOSIS — Z886 Allergy status to analgesic agent status: Secondary | ICD-10-CM | POA: Diagnosis not present

## 2019-08-14 DIAGNOSIS — F17210 Nicotine dependence, cigarettes, uncomplicated: Secondary | ICD-10-CM | POA: Insufficient documentation

## 2019-08-14 DIAGNOSIS — R110 Nausea: Secondary | ICD-10-CM | POA: Insufficient documentation

## 2019-08-14 DIAGNOSIS — N644 Mastodynia: Secondary | ICD-10-CM | POA: Diagnosis not present

## 2019-08-14 DIAGNOSIS — R911 Solitary pulmonary nodule: Secondary | ICD-10-CM | POA: Diagnosis not present

## 2019-08-14 DIAGNOSIS — Z170 Estrogen receptor positive status [ER+]: Secondary | ICD-10-CM | POA: Insufficient documentation

## 2019-08-14 DIAGNOSIS — Z885 Allergy status to narcotic agent status: Secondary | ICD-10-CM | POA: Diagnosis not present

## 2019-08-14 DIAGNOSIS — Z9049 Acquired absence of other specified parts of digestive tract: Secondary | ICD-10-CM | POA: Diagnosis not present

## 2019-08-14 DIAGNOSIS — Z79899 Other long term (current) drug therapy: Secondary | ICD-10-CM | POA: Diagnosis not present

## 2019-08-14 DIAGNOSIS — Z880 Allergy status to penicillin: Secondary | ICD-10-CM | POA: Insufficient documentation

## 2019-08-14 DIAGNOSIS — Z79811 Long term (current) use of aromatase inhibitors: Secondary | ICD-10-CM | POA: Diagnosis not present

## 2019-08-14 DIAGNOSIS — Z9013 Acquired absence of bilateral breasts and nipples: Secondary | ICD-10-CM

## 2019-08-14 DIAGNOSIS — C773 Secondary and unspecified malignant neoplasm of axilla and upper limb lymph nodes: Secondary | ICD-10-CM | POA: Insufficient documentation

## 2019-08-14 MED ORDER — ANASTROZOLE 1 MG PO TABS
1.0000 mg | ORAL_TABLET | Freq: Every day | ORAL | 3 refills | Status: DC
Start: 2019-08-14 — End: 2020-06-23

## 2019-08-14 MED ORDER — PROMETHAZINE HCL 25 MG PO TABS: 25.0000 mg | ORAL_TABLET | Freq: Four times a day (QID) | ORAL | 3 refills | Status: DC | PRN

## 2019-08-14 MED ORDER — OXYCODONE-ACETAMINOPHEN 5-325 MG PO TABS: 1.0000 | ORAL_TABLET | ORAL | 0 refills | Status: DC | PRN

## 2019-08-14 MED ORDER — OXYCODONE-ACETAMINOPHEN 2.5-325 MG PO TABS: 1.0000 | ORAL_TABLET | ORAL | 0 refills | Status: DC | PRN

## 2019-08-14 NOTE — Assessment & Plan Note (Signed)
07/13/2019 H/O right breast cancer on 2017.  Chest CT on 06/06/19 showed a mass in the right axilla and a mass in the right lower lung lobe.  PET scan on 07/03/19 showed a right lower lobe pulmonary nodule concerning for malignancy, the right axillary mass consistent with local breast cancer recurrence, and small hypermetabolic right axillary and neck nodes consistent with metastatic adenopathy.  Biopsy of the right axilla on 07/13/19 showed carcinoma consistent with breast carcinoma, HER-2 negative (1+), ER+ 95%, PR+ 75%, Ki67 25%.  Pathology counseling: I discussed with the patient the findings on the pathology report in great detail and provided her with a copy of this report.  I discussed with her that if the lung masses breast cancer related that it would be considered to be metastatic disease.  Treatment plan: 1.  Consider biopsy of the lung mass to make sure it is not primary lung cancer. 2. initiate antiestrogen therapy with Ibrance with letrozole for metastatic breast cancer.  Goals of treatment: Palliation prolongation of life.  Ibrance: I discussed the risks and benefits of Ibrance including myelosuppression especially neutropenia and with that risk of infection, there is risk of pulmonary embolism and mild peripheral neuropathy as well. Fatigue, nausea, diarrhea, decreased appetite as well as alopecia and thrombocytopenia are also potential side effects of Ibrance  Return to clinic in 2 weeks for labs and follow-up

## 2019-08-16 ENCOUNTER — Encounter: Payer: Self-pay | Admitting: Licensed Clinical Social Worker

## 2019-08-16 NOTE — Progress Notes (Signed)
[place] [employer]  [employer] representative contacted patient by phone  to offer support and assess for needs for new breast cancer patient with history of breast cancer.  Patient reports that she is doing "okay" right now. Biggest issue is pain in arm and shoulder. She is taking medication as prescribed but the medicine "wipes her out". Patient stated she will call her doctor/ nurse if this continues.  Patient lives by herself in [place]. Daughter lives in [place] and can help as needed. She also belongs to a church community and has support there.    CSW informed patient of the support team and support services at [facility].  CSW provided contact information and encouraged patient to call with any questions or concerns.    Jadin Creque, [place], [place] Worker [facility]

## 2019-08-20 ENCOUNTER — Telehealth: Payer: Self-pay | Admitting: Hematology and Oncology

## 2019-08-20 NOTE — Telephone Encounter (Signed)
Scheduled per 8/5 staff msg. Called and spoke with pt, confirmed 8/19 appt

## 2019-08-22 NOTE — Telephone Encounter (Signed)
Dr. Olalere - please advise. Thanks. 

## 2019-08-23 NOTE — Telephone Encounter (Signed)
Cancel Mamm and biopsy VG

## 2019-08-23 NOTE — Telephone Encounter (Signed)
Thank you :)

## 2019-08-23 NOTE — Telephone Encounter (Signed)
Yes I am okay with ordering an urgent mammogram and ultrasound

## 2019-08-23 NOTE — Telephone Encounter (Signed)
Audrey [name] called back and states that patient just saw Dr. Lindi Adie on 08/14/19 and did not see anything in his notes that mentioned still needing order for urgent mammogram and ultrasound.   Dr.Gudena do you still need these orders placed?

## 2019-08-23 NOTE — Telephone Encounter (Signed)
[facility] Dr. Geralyn Flash nurse to see if they still needed orders placed for patient. LMTCB

## 2019-08-29 NOTE — Progress Notes (Signed)
Patient Care Team: [name], MD as PCP - General (Family Medicine)  DIAGNOSIS:    ICD-10-CM   1. Malignant neoplasm of upper-outer quadrant of right breast in female, estrogen receptor positive ([place])  C50.411    Z17.0     SUMMARY OF ONCOLOGIC HISTORY: Oncology History  Malignant neoplasm of upper-outer quadrant of right breast in female, estrogen receptor positive ([place])  07/13/2019 Initial Diagnosis   History of right breast cancer on 2017. Chest CT on 06/06/19 showed a mass in the right axilla and a mass in the right lower lung lobe. PET scan on 07/03/19 showed a right lower lobe pulmonary nodule concerning for malignancy, the right axillary mass consistent with local breast cancer recurrence, and small hypermetabolic right axillary and neck nodes consistent with metastatic adenopathy. Biopsy of the right axilla on 07/13/19 showed carcinoma consistent with breast carcinoma, HER-2 negative (1+), ER+ 95%, PR+ 75%, Ki67 25%.     CHIEF COMPLIANT: Follow-up of recurrent right breast cancer  INTERVAL HISTORY: Audrey [name] is a 79 y.o. with above-mentioned history of recurrent right breast cancer currently on anastrozole. She presents to the clinic today or follow-up.  Marked improvement in the pain.  She is not taking Percocets anymore.  Initially when she took anastrozole she had nausea issues which have resolved.  ALLERGIES:  is allergic to ativan [lorazepam], aspirin, codeine, and penicillins.  MEDICATIONS:  Current Outpatient Medications  Medication Sig Dispense Refill  . amLODipine (NORVASC) 2.5 MG tablet Take 2.5 mg by mouth daily.    Marland Kitchen anastrozole (ARIMIDEX) 1 MG tablet Take 1 tablet (1 mg total) by mouth daily. 90 tablet 3  . ketotifen (ZADITOR) 0.025 % ophthalmic solution Place 1 drop into both eyes 2 (two) times daily as needed (dry eyes).    . lansoprazole (PREVACID) 15 MG capsule Take 15 mg by mouth daily at 12 noon.    Marland Kitchen oxyCODONE-acetaminophen (PERCOCET) 5-325 MG  tablet Take 1 tablet by mouth every 4 (four) hours as needed for severe pain. 30 tablet 0  . promethazine (PHENERGAN) 25 MG tablet Take 1 tablet (25 mg total) by mouth every 6 (six) hours as needed for nausea. 30 tablet 3   No current facility-administered medications for this visit.    PHYSICAL EXAMINATION: ECOG PERFORMANCE STATUS: 1 - Symptomatic but completely ambulatory  There were no vitals filed for this visit. There were no vitals filed for this visit.  LABORATORY DATA:  I have reviewed the data as listed CMP Latest Ref Rng & Units 07/13/2019 02/26/2014 02/25/2014  Glucose 70 - 99 mg/dL 83 104(H) -  BUN 8 - 23 mg/dL 13 10 -  Creatinine 0.44 - 1.00 mg/dL 0.90 0.77 0.94  Sodium 135 - 145 mmol/L 138 134(L) -  Potassium 3.5 - 5.1 mmol/L 4.1 3.9 -  Chloride 98 - 111 mmol/L 108 103 -  CO2 22 - 32 mmol/L 21(L) 19 -  Calcium 8.9 - 10.3 mg/dL 8.8(L) 8.4 -    Lab Results  Component Value Date   WBC 10.1 07/13/2019   HGB 12.9 07/13/2019   HCT 40.1 07/13/2019   MCV 89.1 07/13/2019   PLT 290 07/13/2019   NEUTROABS 6.3 07/13/2019    ASSESSMENT & PLAN:  Malignant neoplasm of upper-outer quadrant of right breast in female, estrogen receptor positive ([place]) H/O right breast cancer on 2017 and 1999: Lumpectomy.  (Bilateral mastectomies in 2017: Seen by Dr. Lavera Guise, could not tolerate antiestrogen therapy) Chest CT on 06/06/19 showed a mass in the  right axilla and a mass in the right lower lung lobe.  PET scan on 07/03/19 showed a right lower lobe pulmonary nodule concerning for malignancy, the right axillary mass consistent with local breast cancer recurrence, and small hypermetabolic right axillary and neck nodes consistent with metastatic adenopathy.  Biopsy of the right axilla on 07/13/19 showed carcinoma consistent with breast carcinoma, HER-2 negative (1+), ER+ 95%, PR+ 75%, Ki67  25%. ---------------------------------------------------------------------------------------------------------------------------------------------- Lung lesion: Felt to be benign and not significantly changed from 2017. Current treatment: Anastrozole 1 mg daily started 08/14/2019 Clinically patient is responding to anastrozole with improvement in the pain and discomfort and swelling. She is no longer taking Percocets. If the tumors do not respond to anastrozole we will add Ibrance to the treatment.  Breast pain: Market improvement on anastrozole therapy.  She is no longer taking Percocets. Return to clinic in 2 1/2 months for follow-up with another ultrasound of the axilla.    No orders of the defined types were placed in this encounter.  The patient has a good understanding of the overall plan. she agrees with it. she will call with any problems that may develop before the next visit here.  Total time spent: 30 mins including face to face time and time spent for planning, charting and coordination of care  Nicholas Lose, MD 08/30/2019  I, Cloyde Reams Dorshimer, am acting as scribe for Dr. Nicholas Lose.  I have reviewed the above documentation for accuracy and completeness, and I agree with the above.

## 2019-08-30 ENCOUNTER — Telehealth: Payer: Self-pay | Admitting: Hematology and Oncology

## 2019-08-30 ENCOUNTER — Other Ambulatory Visit: Payer: Self-pay

## 2019-08-30 ENCOUNTER — Inpatient Hospital Stay: Payer: Medicare Other | Admitting: Hematology and Oncology

## 2019-08-30 DIAGNOSIS — C50411 Malignant neoplasm of upper-outer quadrant of right female breast: Secondary | ICD-10-CM

## 2019-08-30 DIAGNOSIS — Z170 Estrogen receptor positive status [ER+]: Secondary | ICD-10-CM | POA: Diagnosis not present

## 2019-08-30 NOTE — Assessment & Plan Note (Signed)
H/O right breast cancer on 2017 and 1999: Lumpectomy.  (Bilateral mastectomies in 2017: Seen by Dr. Lavera Guise, could not tolerate antiestrogen therapy) Chest CT on 06/06/19 showed a mass in the right axilla and a mass in the right lower lung lobe.  PET scan on 07/03/19 showed a right lower lobe pulmonary nodule concerning for malignancy, the right axillary mass consistent with local breast cancer recurrence, and small hypermetabolic right axillary and neck nodes consistent with metastatic adenopathy.  Biopsy of the right axilla on 07/13/19 showed carcinoma consistent with breast carcinoma, HER-2 negative (1+), ER+ 95%, PR+ 75%, Ki67 25%. ---------------------------------------------------------------------------------------------------------------------------------------------- Lung lesion: Felt to be benign and not significantly changed from 2017. Current treatment: Anastrozole 1 mg daily started 08/14/2019 If the tumors do not respond to anastrozole we will add Ibrance to the treatment.  Breast pain: On Percocets. Return to clinic in 2 months for follow-up with another ultrasound of the axilla.

## 2019-08-30 NOTE — Telephone Encounter (Signed)
Scheduled appts per 8/19 los. Gave pt a print out of AVS.

## 2019-09-03 ENCOUNTER — Telehealth: Payer: Self-pay | Admitting: *Deleted

## 2019-09-03 NOTE — Telephone Encounter (Signed)
Received VM from pt.  Attempt x1 to return call.  No answer, LVM to return call to the office.  

## 2019-10-31 ENCOUNTER — Other Ambulatory Visit: Payer: Self-pay

## 2019-10-31 ENCOUNTER — Ambulatory Visit
Admission: RE | Admit: 2019-10-31 | Discharge: 2019-10-31 | Disposition: A | Discharge status: Home / Self Care | Payer: Medicare Other | Source: Ambulatory Visit | Attending: Hematology and Oncology | Admitting: Hematology and Oncology

## 2019-10-31 DIAGNOSIS — Z170 Estrogen receptor positive status [ER+]: Secondary | ICD-10-CM

## 2019-10-31 DIAGNOSIS — C50411 Malignant neoplasm of upper-outer quadrant of right female breast: Secondary | ICD-10-CM

## 2019-11-19 NOTE — Progress Notes (Signed)
Patient Care Team: [name], MD as PCP - General (Family Medicine)  DIAGNOSIS:    ICD-10-CM   1. Malignant neoplasm of upper-outer quadrant of right breast in female, estrogen receptor positive ([place])  C50.411    Z17.0     SUMMARY OF ONCOLOGIC HISTORY: Oncology History  Malignant neoplasm of upper-outer quadrant of right breast in female, estrogen receptor positive ([place])  07/13/2019 Initial Diagnosis   History of right breast cancer on 2017. Chest CT on 06/06/19 showed a mass in the right axilla and a mass in the right lower lung lobe. PET scan on 07/03/19 showed a right lower lobe pulmonary nodule concerning for malignancy, the right axillary mass consistent with local breast cancer recurrence, and small hypermetabolic right axillary and neck nodes consistent with metastatic adenopathy. Biopsy of the right axilla on 07/13/19 showed carcinoma consistent with breast carcinoma, HER-2 negative (1+), ER+ 95%, PR+ 75%, Ki67 25%.     CHIEF COMPLIANT: Follow-up of recurrent right breast cancer  INTERVAL HISTORY: Audrey [name] is a 79 y.o. with above-mentioned history of recurrent right breast cancer with right axillary masses currently on anastrozole. US of the right axilla on 10/31/19 showed the recently biopsied mass measuring 2.7cm and an adjacent 1.8cm mass. She presents to the clinic today or follow-up.  She tells me that she does not have any further pain in the arm.  She is moving into her daughter's house and sold her own house.  ALLERGIES:  is allergic to ativan [lorazepam], aspirin, codeine, and penicillins.  MEDICATIONS:  Current Outpatient Medications  Medication Sig Dispense Refill   amLODipine (NORVASC) 2.5 MG tablet Take 2.5 mg by mouth daily.     anastrozole (ARIMIDEX) 1 MG tablet Take 1 tablet (1 mg total) by mouth daily. 90 tablet 3   ketotifen (ZADITOR) 0.025 % ophthalmic solution Place 1 drop into both eyes 2 (two) times daily as needed (dry eyes).      lansoprazole (PREVACID) 15 MG capsule Take 15 mg by mouth daily at 12 noon.     oxyCODONE-acetaminophen (PERCOCET) 5-325 MG tablet Take 1 tablet by mouth every 4 (four) hours as needed for severe pain. 30 tablet 0   promethazine (PHENERGAN) 25 MG tablet Take 1 tablet (25 mg total) by mouth every 6 (six) hours as needed for nausea. 30 tablet 3   No current facility-administered medications for this visit.    PHYSICAL EXAMINATION: ECOG PERFORMANCE STATUS: 1 - Symptomatic but completely ambulatory  Vitals:   11/20/19 1117  Pulse: 86  Resp: 18  Temp: (!) 97.5 F (36.4 C)  SpO2: 100%   Filed Weights   11/20/19 1117  Weight: 142 lb 1.6 oz (64.5 kg)      LABORATORY DATA:  I have reviewed the data as listed CMP Latest Ref Rng & Units 07/13/2019 02/26/2014 02/25/2014  Glucose 70 - 99 mg/dL 83 104(H) -  BUN 8 - 23 mg/dL 13 10 -  Creatinine 0.44 - 1.00 mg/dL 0.90 0.77 0.94  Sodium 135 - 145 mmol/L 138 134(L) -  Potassium 3.5 - 5.1 mmol/L 4.1 3.9 -  Chloride 98 - 111 mmol/L 108 103 -  CO2 22 - 32 mmol/L 21(L) 19 -  Calcium 8.9 - 10.3 mg/dL 8.8(L) 8.4 -    Lab Results  Component Value Date   WBC 10.1 07/13/2019   HGB 12.9 07/13/2019   HCT 40.1 07/13/2019   MCV 89.1 07/13/2019   PLT 290 07/13/2019   NEUTROABS 6.3 07/13/2019    ASSESSMENT &  PLAN:  Malignant neoplasm of upper-outer quadrant of right breast in female, estrogen receptor positive ([place]) H/Oright breast cancer on 2017and 1999: Lumpectomy.(Bilateral mastectomies in 2017: Seen by Dr. Lavera Guise, could not tolerate antiestrogen therapy) Chest CT on 06/06/19 showed a mass in the right axilla and a mass in the right lower lung lobe.  PET scan on 07/03/19 showed a right lower lobe pulmonary nodule concerning for malignancy, the right axillary mass consistent with local breast cancer recurrence, and small hypermetabolic right axillary and neck nodes consistent with metastatic adenopathy.  Biopsy of the right axilla on  07/13/19 showed carcinoma consistent with breast carcinoma, HER-2 negative (1+), ER+ 95%, PR+ 75%, Ki67 25%. ---------------------------------------------------------------------------------------------------------------------------------------------- Lung lesion: Felt to be benign and not significantly changed from 2017. Current treatment: Anastrozole 1 mg daily started 08/14/2019  10/31/2019: Ultrasound axilla: 2 adjacent masses 2.7 cm and 1.8 cm right axilla. Severe pain: Marked improvement  Because of the lung nodule highly suspicious for metastatic cancer and difficulty with performing the axillary surgery, we decided to treat her with palliative intent antiestrogen therapy.  She is moving in with her daughter.  She sold her house. Return to clinic in 3 months with a CT scan and follow-up.  If necessary we can add Ibrance to her regimen.    No orders of the defined types were placed in this encounter.  The patient has a good understanding of the overall plan. she agrees with it. she will call with any problems that may develop before the next visit here.  Total time spent: 20 mins including face to face time and time spent for planning, charting and coordination of care  Nicholas Lose, MD 11/20/2019  I, Cloyde Reams Dorshimer, am acting as scribe for Dr. Nicholas Lose.  I have reviewed the above documentation for accuracy and completeness, and I agree with the above.

## 2019-11-20 ENCOUNTER — Inpatient Hospital Stay: Payer: Medicare Other | Attending: Hematology and Oncology | Admitting: Hematology and Oncology

## 2019-11-20 ENCOUNTER — Other Ambulatory Visit: Payer: Self-pay

## 2019-11-20 DIAGNOSIS — C773 Secondary and unspecified malignant neoplasm of axilla and upper limb lymph nodes: Secondary | ICD-10-CM | POA: Insufficient documentation

## 2019-11-20 DIAGNOSIS — Z170 Estrogen receptor positive status [ER+]: Secondary | ICD-10-CM | POA: Diagnosis not present

## 2019-11-20 DIAGNOSIS — Z79811 Long term (current) use of aromatase inhibitors: Secondary | ICD-10-CM | POA: Diagnosis not present

## 2019-11-20 DIAGNOSIS — Z880 Allergy status to penicillin: Secondary | ICD-10-CM | POA: Diagnosis not present

## 2019-11-20 DIAGNOSIS — C50411 Malignant neoplasm of upper-outer quadrant of right female breast: Secondary | ICD-10-CM | POA: Diagnosis not present

## 2019-11-20 DIAGNOSIS — Z886 Allergy status to analgesic agent status: Secondary | ICD-10-CM | POA: Diagnosis not present

## 2019-11-20 DIAGNOSIS — Z885 Allergy status to narcotic agent status: Secondary | ICD-10-CM | POA: Insufficient documentation

## 2019-11-20 DIAGNOSIS — Z9013 Acquired absence of bilateral breasts and nipples: Secondary | ICD-10-CM | POA: Insufficient documentation

## 2019-11-20 DIAGNOSIS — Z79899 Other long term (current) drug therapy: Secondary | ICD-10-CM | POA: Insufficient documentation

## 2019-11-20 DIAGNOSIS — R911 Solitary pulmonary nodule: Secondary | ICD-10-CM | POA: Diagnosis not present

## 2019-11-20 MED ORDER — ALPRAZOLAM 0.25 MG PO TABS
0.2500 mg | ORAL_TABLET | Freq: Every evening | ORAL | 0 refills | Status: DC | PRN
Start: 2019-11-20 — End: 2020-06-23

## 2019-11-20 NOTE — Assessment & Plan Note (Signed)
H/Oright breast cancer on 2017and 1999: Lumpectomy.(Bilateral mastectomies in 2017: Seen by Dr. Lavera Guise, could not tolerate antiestrogen therapy) Chest CT on 06/06/19 showed a mass in the right axilla and a mass in the right lower lung lobe.  PET scan on 07/03/19 showed a right lower lobe pulmonary nodule concerning for malignancy, the right axillary mass consistent with local breast cancer recurrence, and small hypermetabolic right axillary and neck nodes consistent with metastatic adenopathy.  Biopsy of the right axilla on 07/13/19 showed carcinoma consistent with breast carcinoma, HER-2 negative (1+), ER+ 95%, PR+ 75%, Ki67 25%. ---------------------------------------------------------------------------------------------------------------------------------------------- Lung lesion: Felt to be benign and not significantly changed from 2017. Current treatment: Anastrozole 1 mg daily started 08/14/2019  10/31/2019: Ultrasound axilla: 2 adjacent masses 2.7 cm and 1.8 cm right axilla. Severe pain: Marked improvement  Return to clinic in 3 months with a CT scan and follow-up

## 2019-11-26 ENCOUNTER — Telehealth: Payer: Self-pay | Admitting: Hematology and Oncology

## 2019-11-26 NOTE — Telephone Encounter (Signed)
Scheduled per 11/9 los. Called pt and left a msg

## 2020-01-03 ENCOUNTER — Encounter: Payer: Self-pay | Admitting: *Deleted

## 2020-01-03 NOTE — Progress Notes (Signed)
Received call from pt daughter Levada Dy requesting MD to evaluate pt for dementia.  Levada Dy states pt experiences mixed emotions daily and recently has gotten involved with an internet romance.  States pt has sent over $40,000 [place].  Per MD pt needing to be evaluated and treated by PCP for possible dementia.  Pt daughter verbalized understanding and appreciative of the advice.

## 2020-02-11 ENCOUNTER — Telehealth: Payer: Self-pay | Admitting: Radiology

## 2020-02-19 ENCOUNTER — Telehealth: Payer: Self-pay | Admitting: Hematology and Oncology

## 2020-02-19 NOTE — Telephone Encounter (Signed)
Changed appt time on 2/10 per provider. Called and spoke with pt, confirmed 2/10 appt new time

## 2020-02-20 ENCOUNTER — Inpatient Hospital Stay: Payer: Medicare Other | Attending: Hematology and Oncology

## 2020-02-20 ENCOUNTER — Other Ambulatory Visit: Payer: Medicare Other

## 2020-02-20 ENCOUNTER — Ambulatory Visit (HOSPITAL_COMMUNITY)
Admission: RE | Admit: 2020-02-20 | Discharge: 2020-02-20 | Disposition: A | Discharge status: Home / Self Care | Payer: Medicare Other | Source: Ambulatory Visit | Attending: Hematology and Oncology | Admitting: Hematology and Oncology

## 2020-02-20 ENCOUNTER — Encounter (HOSPITAL_COMMUNITY): Payer: Self-pay

## 2020-02-20 ENCOUNTER — Other Ambulatory Visit: Payer: Self-pay

## 2020-02-20 DIAGNOSIS — C778 Secondary and unspecified malignant neoplasm of lymph nodes of multiple regions: Secondary | ICD-10-CM | POA: Insufficient documentation

## 2020-02-20 DIAGNOSIS — Z9013 Acquired absence of bilateral breasts and nipples: Secondary | ICD-10-CM | POA: Diagnosis not present

## 2020-02-20 DIAGNOSIS — Z79811 Long term (current) use of aromatase inhibitors: Secondary | ICD-10-CM | POA: Diagnosis not present

## 2020-02-20 DIAGNOSIS — R911 Solitary pulmonary nodule: Secondary | ICD-10-CM | POA: Diagnosis not present

## 2020-02-20 DIAGNOSIS — Z170 Estrogen receptor positive status [ER+]: Secondary | ICD-10-CM | POA: Insufficient documentation

## 2020-02-20 DIAGNOSIS — Z79899 Other long term (current) drug therapy: Secondary | ICD-10-CM | POA: Diagnosis not present

## 2020-02-20 DIAGNOSIS — C50411 Malignant neoplasm of upper-outer quadrant of right female breast: Secondary | ICD-10-CM

## 2020-02-20 LAB — CBC WITH DIFFERENTIAL (CANCER CENTER ONLY)
Abs Immature Granulocytes: 0.06 10*3/uL (ref 0.00–0.07)
Basophils Absolute: 0.1 10*3/uL (ref 0.0–0.1)
Basophils Relative: 1 %
Eosinophils Absolute: 0.1 10*3/uL (ref 0.0–0.5)
Eosinophils Relative: 1 %
HCT: 38 % (ref 36.0–46.0)
Hemoglobin: 12.6 g/dL (ref 12.0–15.0)
Immature Granulocytes: 1 %
Lymphocytes Relative: 30 %
Lymphs Abs: 2.9 10*3/uL (ref 0.7–4.0)
MCH: 28.6 pg (ref 26.0–34.0)
MCHC: 33.2 g/dL (ref 30.0–36.0)
MCV: 86.4 fL (ref 80.0–100.0)
Monocytes Absolute: 0.6 10*3/uL (ref 0.1–1.0)
Monocytes Relative: 6 %
Neutro Abs: 5.9 10*3/uL (ref 1.7–7.7)
Neutrophils Relative %: 61 %
Platelet Count: 373 10*3/uL (ref 150–400)
RBC: 4.4 MIL/uL (ref 3.87–5.11)
RDW: 14.1 % (ref 11.5–15.5)
WBC Count: 9.6 10*3/uL (ref 4.0–10.5)
nRBC: 0 % (ref 0.0–0.2)

## 2020-02-20 LAB — CMP (CANCER CENTER ONLY)
ALT: 11 U/L (ref 0–44)
AST: 13 U/L — ABNORMAL LOW (ref 15–41)
Albumin: 4 g/dL (ref 3.5–5.0)
Alkaline Phosphatase: 90 U/L (ref 38–126)
Anion gap: 9 (ref 5–15)
BUN: 20 mg/dL (ref 8–23)
CO2: 24 mmol/L (ref 22–32)
Calcium: 9.7 mg/dL (ref 8.9–10.3)
Chloride: 108 mmol/L (ref 98–111)
Creatinine: 1.26 mg/dL — ABNORMAL HIGH (ref 0.44–1.00)
GFR, Estimated: 44 mL/min — ABNORMAL LOW (ref >60)
Glucose, Bld: 84 mg/dL (ref 70–99)
Potassium: 4.1 mmol/L (ref 3.5–5.1)
Sodium: 141 mmol/L (ref 135–145)
Total Bilirubin: 0.4 mg/dL (ref 0.3–1.2)
Total Protein: 7.5 g/dL (ref 6.5–8.1)

## 2020-02-20 MED ORDER — IOHEXOL 300 MG/ML  SOLN
60.0000 mL | Freq: Once | INTRAMUSCULAR | Status: AC | PRN
  Administered 2020-02-20: 60 mL via INTRAVENOUS

## 2020-02-20 NOTE — Progress Notes (Signed)
HEMATOLOGY-ONCOLOGY PROGRESS NOTE CHIEF COMPLIANT: Follow-up of recurrent right breast cancer  INTERVAL HISTORY: Audrey [name] is a 80 y.o. female with above-mentioned history of recurrent right breast cancer with right axillary masses currently on anastrozole. She presents over MyChart today to review her scan.  Oncology History  Malignant neoplasm of upper-outer quadrant of right breast in female, estrogen receptor positive ([place])  07/13/2019 Initial Diagnosis   History of right breast cancer on 2017. Chest CT on 06/06/19 showed a mass in the right axilla and a mass in the right lower lung lobe. PET scan on 07/03/19 showed a right lower lobe pulmonary nodule concerning for malignancy, the right axillary mass consistent with local breast cancer recurrence, and small hypermetabolic right axillary and neck nodes consistent with metastatic adenopathy. Biopsy of the right axilla on 07/13/19 showed carcinoma consistent with breast carcinoma, HER-2 negative (1+), ER+ 95%, PR+ 75%, Ki67 25%.     Observations/Objective:  There were no vitals filed for this visit. There is no height or weight on file to calculate BMI.  I have reviewed the data as listed CMP Latest Ref Rng & Units 02/20/2020 07/13/2019 02/26/2014  Glucose 70 - 99 mg/dL 84 83 104(H)  BUN 8 - 23 mg/dL 20 13 10   Creatinine 0.44 - 1.00 mg/dL 1.26(H) 0.90 0.77  Sodium 135 - 145 mmol/L 141 138 134(L)  Potassium 3.5 - 5.1 mmol/L 4.1 4.1 3.9  Chloride 98 - 111 mmol/L 108 108 103  CO2 22 - 32 mmol/L 24 21(L) 19  Calcium 8.9 - 10.3 mg/dL 9.7 8.8(L) 8.4  Total Protein 6.5 - 8.1 g/dL 7.5 - -  Total Bilirubin 0.3 - 1.2 mg/dL 0.4 - -  Alkaline Phos 38 - 126 U/L 90 - -  AST 15 - 41 U/L 13(L) - -  ALT 0 - 44 U/L 11 - -    Lab Results  Component Value Date   WBC 9.6 02/20/2020   HGB 12.6 02/20/2020   HCT 38.0 02/20/2020   MCV 86.4 02/20/2020   PLT 373 02/20/2020   NEUTROABS 5.9 02/20/2020      Assessment Plan:  Malignant neoplasm of  upper-outer quadrant of right breast in female, estrogen receptor positive ([place]) H/Oright breast cancer on 2017and 1999: Lumpectomy.(Bilateral mastectomies in 2017: Seen by Dr. Lavera Guise, could not tolerate antiestrogen therapy) Chest CT on 06/06/19 showed a mass in the right axilla and a mass in the right lower lung lobe.  PET scan on 07/03/19 showed a right lower lobe pulmonary nodule concerning for malignancy, the right axillary mass consistent with local breast cancer recurrence, and small hypermetabolic right axillary and neck nodes consistent with metastatic adenopathy.  Biopsy of the right axilla on 07/13/19 showed carcinoma consistent with breast carcinoma, HER-2 negative (1+), ER+ 95%, PR+ 75%, Ki67 25%. ---------------------------------------------------------------------------------------------------------------------------------------------- Lung lesion: originally felt to be benign and not significantly changed from 2017. Current treatment: Anastrozole 1 mg daily started 08/14/2019  10/31/2019: Ultrasound axilla: 2 adjacent masses 2.7 cm and 1.8 cm right axilla. Severe pain: Marked improvement  Because of the lung nodule highly suspicious for metastatic cancer and difficulty with performing the axillary surgery, we decided to treat her with palliative intent antiestrogen therapy.  She is moved in with her daughter. CT Chest 02/20/20: RLL lung mass is similar in size (3.4 cm), Multiple non specific new lung nodules 5 mm or less, T2 sclerotic lesion same  Return to clinic in 3 months with a CT scan and follow-up.  If necessary we can add  Ibrance to her regimen.  I discussed the assessment and treatment plan with the patient. The patient was provided an opportunity to ask questions and all were answered. The patient agreed with the plan and demonstrated an understanding of the instructions. The patient was advised to call back or seek an in-person evaluation if the symptoms worsen  or if the condition fails to improve as anticipated.   Total time spent: 20 minutes including face-to-face MyChart video visit time and time spent for planning, charting and coordination of care  Audrey Eisenmenger, MD 02/21/2020   I, Cloyde Reams Dorshimer, am acting as scribe for Audrey Lose, MD.  I have reviewed the above documentation for accuracy and completeness, and I agree with the above.

## 2020-02-21 ENCOUNTER — Ambulatory Visit: Payer: Medicare Other | Admitting: Hematology and Oncology

## 2020-02-21 ENCOUNTER — Inpatient Hospital Stay (HOSPITAL_BASED_OUTPATIENT_CLINIC_OR_DEPARTMENT_OTHER): Payer: Medicare Other | Admitting: Hematology and Oncology

## 2020-02-21 VITALS — BP 170/65 | HR 50 | Temp 97.9°F | Resp 17 | Ht 62.25 in | Wt 138.3 lb

## 2020-02-21 DIAGNOSIS — C50919 Malignant neoplasm of unspecified site of unspecified female breast: Secondary | ICD-10-CM

## 2020-02-21 DIAGNOSIS — C50411 Malignant neoplasm of upper-outer quadrant of right female breast: Secondary | ICD-10-CM

## 2020-02-21 DIAGNOSIS — Z170 Estrogen receptor positive status [ER+]: Secondary | ICD-10-CM | POA: Diagnosis not present

## 2020-02-21 NOTE — Assessment & Plan Note (Signed)
H/Oright breast cancer on 2017and 1999: Lumpectomy.(Bilateral mastectomies in 2017: Seen by Dr. Lavera Guise, could not tolerate antiestrogen therapy) Chest CT on 06/06/19 showed a mass in the right axilla and a mass in the right lower lung lobe.  PET scan on 07/03/19 showed a right lower lobe pulmonary nodule concerning for malignancy, the right axillary mass consistent with local breast cancer recurrence, and small hypermetabolic right axillary and neck nodes consistent with metastatic adenopathy.  Biopsy of the right axilla on 07/13/19 showed carcinoma consistent with breast carcinoma, HER-2 negative (1+), ER+ 95%, PR+ 75%, Ki67 25%. ---------------------------------------------------------------------------------------------------------------------------------------------- Lung lesion: originally felt to be benign and not significantly changed from 2017. Current treatment: Anastrozole 1 mg daily started 08/14/2019  10/31/2019: Ultrasound axilla: 2 adjacent masses 2.7 cm and 1.8 cm right axilla. Severe pain: Marked improvement  Because of the lung nodule highly suspicious for metastatic cancer and difficulty with performing the axillary surgery, we decided to treat her with palliative intent antiestrogen therapy.  She is moved in with her daughter. CT Chest 02/20/20:  Return to clinic in 3 months with a CT scan and follow-up.  If necessary we can add Ibrance to her regimen.

## 2020-06-20 ENCOUNTER — Ambulatory Visit (HOSPITAL_COMMUNITY)
Admission: RE | Admit: 2020-06-20 | Discharge: 2020-06-20 | Disposition: A | Discharge status: Home / Self Care | Payer: Medicare HMO | Source: Ambulatory Visit | Attending: Hematology and Oncology | Admitting: Hematology and Oncology

## 2020-06-20 ENCOUNTER — Inpatient Hospital Stay: Payer: Medicare HMO | Attending: Hematology and Oncology

## 2020-06-20 ENCOUNTER — Other Ambulatory Visit: Payer: Self-pay

## 2020-06-20 DIAGNOSIS — Z885 Allergy status to narcotic agent status: Secondary | ICD-10-CM | POA: Insufficient documentation

## 2020-06-20 DIAGNOSIS — Z79899 Other long term (current) drug therapy: Secondary | ICD-10-CM | POA: Insufficient documentation

## 2020-06-20 DIAGNOSIS — Z170 Estrogen receptor positive status [ER+]: Secondary | ICD-10-CM | POA: Insufficient documentation

## 2020-06-20 DIAGNOSIS — Z79811 Long term (current) use of aromatase inhibitors: Secondary | ICD-10-CM | POA: Insufficient documentation

## 2020-06-20 DIAGNOSIS — Z886 Allergy status to analgesic agent status: Secondary | ICD-10-CM | POA: Insufficient documentation

## 2020-06-20 DIAGNOSIS — C50411 Malignant neoplasm of upper-outer quadrant of right female breast: Secondary | ICD-10-CM | POA: Insufficient documentation

## 2020-06-20 DIAGNOSIS — Z880 Allergy status to penicillin: Secondary | ICD-10-CM | POA: Insufficient documentation

## 2020-06-20 DIAGNOSIS — R200 Anesthesia of skin: Secondary | ICD-10-CM | POA: Insufficient documentation

## 2020-06-20 DIAGNOSIS — C50919 Malignant neoplasm of unspecified site of unspecified female breast: Secondary | ICD-10-CM

## 2020-06-20 DIAGNOSIS — Z9013 Acquired absence of bilateral breasts and nipples: Secondary | ICD-10-CM | POA: Diagnosis not present

## 2020-06-20 DIAGNOSIS — R590 Localized enlarged lymph nodes: Secondary | ICD-10-CM | POA: Diagnosis not present

## 2020-06-20 DIAGNOSIS — C773 Secondary and unspecified malignant neoplasm of axilla and upper limb lymph nodes: Secondary | ICD-10-CM | POA: Diagnosis not present

## 2020-06-20 LAB — CBC WITH DIFFERENTIAL (CANCER CENTER ONLY)
Abs Immature Granulocytes: 0.03 10*3/uL (ref 0.00–0.07)
Basophils Absolute: 0.1 10*3/uL (ref 0.0–0.1)
Basophils Relative: 1 %
Eosinophils Absolute: 0.2 10*3/uL (ref 0.0–0.5)
Eosinophils Relative: 3 %
HCT: 39.6 % (ref 36.0–46.0)
Hemoglobin: 13.4 g/dL (ref 12.0–15.0)
Immature Granulocytes: 0 %
Lymphocytes Relative: 39 %
Lymphs Abs: 3.8 10*3/uL (ref 0.7–4.0)
MCH: 28.7 pg (ref 26.0–34.0)
MCHC: 33.8 g/dL (ref 30.0–36.0)
MCV: 84.8 fL (ref 80.0–100.0)
Monocytes Absolute: 0.5 10*3/uL (ref 0.1–1.0)
Monocytes Relative: 5 %
Neutro Abs: 5.1 10*3/uL (ref 1.7–7.7)
Neutrophils Relative %: 52 %
Platelet Count: 314 10*3/uL (ref 150–400)
RBC: 4.67 MIL/uL (ref 3.87–5.11)
RDW: 13.8 % (ref 11.5–15.5)
WBC Count: 9.7 10*3/uL (ref 4.0–10.5)
nRBC: 0 % (ref 0.0–0.2)

## 2020-06-20 LAB — CMP (CANCER CENTER ONLY)
ALT: 11 U/L (ref 0–44)
AST: 14 U/L — ABNORMAL LOW (ref 15–41)
Albumin: 4 g/dL (ref 3.5–5.0)
Alkaline Phosphatase: 100 U/L (ref 38–126)
Anion gap: 11 (ref 5–15)
BUN: 17 mg/dL (ref 8–23)
CO2: 23 mmol/L (ref 22–32)
Calcium: 10.2 mg/dL (ref 8.9–10.3)
Chloride: 108 mmol/L (ref 98–111)
Creatinine: 1.09 mg/dL — ABNORMAL HIGH (ref 0.44–1.00)
GFR, Estimated: 52 mL/min — ABNORMAL LOW (ref >60)
Glucose, Bld: 88 mg/dL (ref 70–99)
Potassium: 4.4 mmol/L (ref 3.5–5.1)
Sodium: 142 mmol/L (ref 135–145)
Total Bilirubin: 0.5 mg/dL (ref 0.3–1.2)
Total Protein: 7.3 g/dL (ref 6.5–8.1)

## 2020-06-21 NOTE — Progress Notes (Signed)
Patient Care Team: [name], MD as PCP - General (Family Medicine)  DIAGNOSIS:    ICD-10-CM   1. Malignant neoplasm of upper-outer quadrant of right breast in female, estrogen receptor positive ([place])  C50.411 CT Chest Wo Contrast   Z17.0     2. Metastatic breast cancer ([place])  C50.919 CT Chest Wo Contrast      SUMMARY OF ONCOLOGIC HISTORY: Oncology History  Malignant neoplasm of upper-outer quadrant of right breast in female, estrogen receptor positive ([place])  07/13/2019 Initial Diagnosis   History of right breast cancer on 2017. Chest CT on 06/06/19 showed a mass in the right axilla and a mass in the right lower lung lobe. PET scan on 07/03/19 showed a right lower lobe pulmonary nodule concerning for malignancy, the right axillary mass consistent with local breast cancer recurrence, and small hypermetabolic right axillary and neck nodes consistent with metastatic adenopathy. Biopsy of the right axilla on 07/13/19 showed carcinoma consistent with breast carcinoma, HER-2 negative (1+), ER+ 95%, PR+ 75%, Ki67 25%.     CHIEF COMPLIANT: Follow-up recurrent right breast cancer  INTERVAL HISTORY: Audrey [name] is a 80 y.o. with above-mentioned history of recurrent right breast cancer with right axillary masses currently on anastrozole. She reports to clinic today to review her CT CAP.  Her major complaint is numbness of the right arm.  Her pain is not severe.  ALLERGIES:  is allergic to ativan [lorazepam], aspirin, codeine, and penicillins.  MEDICATIONS:  Current Outpatient Medications  Medication Sig Dispense Refill   gabapentin (NEURONTIN) 300 MG capsule Take 1 capsule (300 mg total) by mouth at bedtime. 90 capsule 3   amLODipine (NORVASC) 2.5 MG tablet Take 2.5 mg by mouth daily.     anastrozole (ARIMIDEX) 1 MG tablet Take 1 tablet (1 mg total) by mouth daily. 90 tablet 3   ketotifen (ZADITOR) 0.025 % ophthalmic solution Place 1 drop into both eyes 2 (two) times daily as needed  (dry eyes).     lansoprazole (PREVACID) 15 MG capsule Take 15 mg by mouth daily at 12 noon.     No current facility-administered medications for this visit.    PHYSICAL EXAMINATION: ECOG PERFORMANCE STATUS: 1 - Symptomatic but completely ambulatory  Vitals:   06/23/20 0851  BP: (!) 163/64  Pulse: 91  Resp: 18  Temp: 97.7 F (36.5 C)  SpO2: 97%   Filed Weights   06/23/20 0851  Weight: 133 lb 12.8 oz (60.7 kg)    BREAST: No palpable masses or nodules in either right or left breasts. No palpable axillary supraclavicular or infraclavicular adenopathy no breast tenderness or nipple discharge. (exam performed in the presence of a chaperone)  LABORATORY DATA:  I have reviewed the data as listed CMP Latest Ref Rng & Units 06/20/2020 02/20/2020 07/13/2019  Glucose 70 - 99 mg/dL 88 84 83  BUN 8 - 23 mg/dL 17 20 13   Creatinine 0.44 - 1.00 mg/dL 1.09(H) 1.26(H) 0.90  Sodium 135 - 145 mmol/L 142 141 138  Potassium 3.5 - 5.1 mmol/L 4.4 4.1 4.1  Chloride 98 - 111 mmol/L 108 108 108  CO2 22 - 32 mmol/L 23 24 21(L)  Calcium 8.9 - 10.3 mg/dL 10.2 9.7 8.8(L)  Total Protein 6.5 - 8.1 g/dL 7.3 7.5 -  Total Bilirubin 0.3 - 1.2 mg/dL 0.5 0.4 -  Alkaline Phos 38 - 126 U/L 100 90 -  AST 15 - 41 U/L 14(L) 13(L) -  ALT 0 - 44 U/L 11 11 -  Lab Results  Component Value Date   WBC 9.7 06/20/2020   HGB 13.4 06/20/2020   HCT 39.6 06/20/2020   MCV 84.8 06/20/2020   PLT 314 06/20/2020   NEUTROABS 5.1 06/20/2020    ASSESSMENT & PLAN:  Malignant neoplasm of upper-outer quadrant of right breast in female, estrogen receptor positive ([place]) H/O right breast cancer on 2017 and 1999: Lumpectomy.  (Bilateral mastectomies in 2017: Seen by Dr. Lavera Guise, could not tolerate antiestrogen therapy) Chest CT on 06/06/19 showed a mass in the right axilla and a mass in the right lower lung lobe. PET scan on 07/03/19 showed a right lower lobe pulmonary nodule concerning for malignancy, the right axillary mass  consistent with local breast cancer recurrence, and small hypermetabolic right axillary and neck nodes consistent with metastatic adenopathy. Biopsy of the right axilla on 07/13/19 showed carcinoma consistent with breast carcinoma, HER-2 negative (1+), ER+ 95%, PR+ 75%, Ki67 25%. ---------------------------------------------------------------------------------------------------------------------------------------------- Lung lesion: originally felt to be benign and not significantly changed from 2017. Current treatment: Anastrozole 1 mg daily started 08/14/2019   10/31/2019: Ultrasound axilla: 2 adjacent masses 2.7 cm and 1.8 cm right axilla. Severe pain: Marked improvement   Because of the lung nodule highly suspicious for metastatic cancer and difficulty with performing the axillary surgery, we decided to treat her with palliative intent antiestrogen therapy.   She is moved in with her daughter. CT Chest 02/20/20: RLL lung mass is similar in size (3.4 cm), Multiple non specific new lung nodules 5 mm or less, T2 sclerotic lesion same CT chest abdomen and pelvis 06/21/2020: Persistent right axillary lymphadenopathy stable.  Spiculated right lower lobe lung mass, slow growth.  Minimal interval enlargement of small right lower lobe lung nodule  Radiology review: Overall unchanged appearance of the CT scan with the exception of the lung nodule which has increased in size from 3.1 cm to 3.4 cm.  Lung nodule: I discussed with her about 3 options.  First option is to biopsy second would be to refer her to cardiothoracic and third option would be to watch and monitor.  She prefer to watch and monitor.  Continue current treatment and follow-up in 4 months with another CT scan of the chest alone.    Orders Placed This Encounter  Procedures   CT Chest Wo Contrast    Standing Status:   Future    Standing Expiration Date:   06/23/2021    Order Specific Question:   Preferred imaging location?    Answer:    [facility]    Order Specific Question:   Release to patient    Answer:   Immediate    The patient has a good understanding of the overall plan. she agrees with it. she will call with any problems that may develop before the next visit here.  Total time spent: 20 mins including face to face time and time spent for planning, charting and coordination of care  Rulon Eisenmenger, MD, MPH 06/23/2020  I, Thana Ates, am acting as scribe for Dr. Nicholas Lose.  I have reviewed the above documentation for accuracy and completeness, and I agree with the above.

## 2020-06-23 ENCOUNTER — Inpatient Hospital Stay (HOSPITAL_BASED_OUTPATIENT_CLINIC_OR_DEPARTMENT_OTHER): Payer: Medicare HMO | Admitting: Hematology and Oncology

## 2020-06-23 ENCOUNTER — Other Ambulatory Visit: Payer: Self-pay

## 2020-06-23 ENCOUNTER — Telehealth: Payer: Self-pay | Admitting: Hematology and Oncology

## 2020-06-23 VITALS — BP 163/64 | HR 91 | Temp 97.7°F | Resp 18 | Ht 62.0 in | Wt 133.8 lb

## 2020-06-23 DIAGNOSIS — Z170 Estrogen receptor positive status [ER+]: Secondary | ICD-10-CM | POA: Diagnosis not present

## 2020-06-23 DIAGNOSIS — C50411 Malignant neoplasm of upper-outer quadrant of right female breast: Secondary | ICD-10-CM

## 2020-06-23 DIAGNOSIS — C50919 Malignant neoplasm of unspecified site of unspecified female breast: Secondary | ICD-10-CM | POA: Diagnosis not present

## 2020-06-23 MED ORDER — ANASTROZOLE 1 MG PO TABS: 1.0000 mg | ORAL_TABLET | Freq: Every day | ORAL | 3 refills | Status: DC

## 2020-06-23 MED ORDER — GABAPENTIN 300 MG PO CAPS: 300.0000 mg | ORAL_CAPSULE | Freq: Every day | ORAL | 3 refills | Status: DC

## 2020-06-23 NOTE — Assessment & Plan Note (Signed)
H/Oright breast cancer on 2017and 1999: Lumpectomy.(Bilateral mastectomies in 2017: Seen by Dr. Lavera Guise, could not tolerate antiestrogen therapy) Chest CT on 06/06/19 showed a mass in the right axilla and a mass in the right lower lung lobe. PET scan on 07/03/19 showed a right lower lobe pulmonary nodule concerning for malignancy, the right axillary mass consistent with local breast cancer recurrence, and small hypermetabolic right axillary and neck nodes consistent with metastatic adenopathy. Biopsy of the right axilla on 07/13/19 showed carcinoma consistent with breast carcinoma, HER-2 negative (1+), ER+ 95%, PR+ 75%, Ki67 25%. ---------------------------------------------------------------------------------------------------------------------------------------------- Lung lesion: originally felt to be benign and not significantly changed from 2017. Current treatment: Anastrozole 1 mg daily started 08/14/2019  10/31/2019:Ultrasound axilla: 2 adjacent masses 2.7 cm and 1.8 cm right axilla. Severe pain: Marked improvement  Because of the lung nodule highly suspicious for metastatic cancer and difficulty with performing the axillary surgery, we decided to treat her with palliative intent antiestrogen therapy.  She is moved in with her daughter. CT Chest 02/20/20: RLL lung mass is similar in size (3.4 cm), Multiple non specific new lung nodules 5 mm or less, T2 sclerotic lesion same CT chest abdomen and pelvis 06/21/2020: Persistent right axillary lymphadenopathy stable.  Spiculated right lower lobe lung mass, slow growth.  Minimal interval enlargement of small right lower lobe lung nodule  Radiology review: Overall unchanged appearance of the CT scan.  Continue current treatment and follow-up in 6 months with another CT scan

## 2020-06-23 NOTE — Telephone Encounter (Signed)
Scheduled appointment per 06/13 los. Patient is aware.

## 2020-08-15 ENCOUNTER — Other Ambulatory Visit: Payer: Self-pay | Admitting: *Deleted

## 2020-08-15 MED ORDER — ANASTROZOLE 1 MG PO TABS: 1.0000 mg | ORAL_TABLET | Freq: Every day | ORAL | 3 refills | Status: DC

## 2020-10-20 ENCOUNTER — Other Ambulatory Visit: Payer: Self-pay

## 2020-10-20 ENCOUNTER — Ambulatory Visit (HOSPITAL_BASED_OUTPATIENT_CLINIC_OR_DEPARTMENT_OTHER)
Admission: RE | Admit: 2020-10-20 | Discharge: 2020-10-20 | Disposition: A | Discharge status: Home / Self Care | Payer: Medicare HMO | Source: Ambulatory Visit | Attending: Hematology and Oncology | Admitting: Hematology and Oncology

## 2020-10-20 DIAGNOSIS — Z170 Estrogen receptor positive status [ER+]: Secondary | ICD-10-CM | POA: Insufficient documentation

## 2020-10-20 DIAGNOSIS — C50411 Malignant neoplasm of upper-outer quadrant of right female breast: Secondary | ICD-10-CM | POA: Diagnosis not present

## 2020-10-20 DIAGNOSIS — C50919 Malignant neoplasm of unspecified site of unspecified female breast: Secondary | ICD-10-CM | POA: Diagnosis present

## 2020-10-21 NOTE — Progress Notes (Signed)
Patient Care Team: [name], MD as PCP - General (Family Medicine)  DIAGNOSIS:    ICD-10-CM   1. Malignant neoplasm of upper-outer quadrant of right breast in female, estrogen receptor positive ([place])  C50.411    Z17.0       SUMMARY OF ONCOLOGIC HISTORY: Oncology History  Malignant neoplasm of upper-outer quadrant of right breast in female, estrogen receptor positive ([place])  07/13/2019 Initial Diagnosis   History of right breast cancer on 2017. Chest CT on 06/06/19 showed a mass in the right axilla and a mass in the right lower lung lobe. PET scan on 07/03/19 showed a right lower lobe pulmonary nodule concerning for malignancy, the right axillary mass consistent with local breast cancer recurrence, and small hypermetabolic right axillary and neck nodes consistent with metastatic adenopathy. Biopsy of the right axilla on 07/13/19 showed carcinoma consistent with breast carcinoma, HER-2 negative (1+), ER+ 95%, PR+ 75%, Ki67 25%.     CHIEF COMPLIANT: Follow-up recurrent right breast cancer  INTERVAL HISTORY: Audrey [name] is a 80 y.o. with above-mentioned history of recurrent right breast cancer with right axillary masses currently on anastrozole. CT Chest on 10/21/2020 showed interval enlargement of a spiculated subpleural mass of the dependent right lower lobe measuring 3.6 x 2.2 cm, previously 3.1 x 1.7 cm, bulky right axillary lymph nodes containing biopsy clip measuring up to 4.0 x 2.8 cm, previously 2.5 x 1.7 cm. She reports to clinic today for follow-up.   ALLERGIES:  is allergic to ativan [lorazepam], aspirin, codeine, and penicillins.  MEDICATIONS:  Current Outpatient Medications  Medication Sig Dispense Refill   amLODipine (NORVASC) 2.5 MG tablet Take 2.5 mg by mouth daily.     anastrozole (ARIMIDEX) 1 MG tablet Take 1 tablet (1 mg total) by mouth daily. 90 tablet 3   gabapentin (NEURONTIN) 300 MG capsule Take 1 capsule (300 mg total) by mouth at bedtime. 90 capsule 3    ketotifen (ZADITOR) 0.025 % ophthalmic solution Place 1 drop into both eyes 2 (two) times daily as needed (dry eyes).     lansoprazole (PREVACID) 15 MG capsule Take 15 mg by mouth daily at 12 noon.     No current facility-administered medications for this visit.    PHYSICAL EXAMINATION: ECOG PERFORMANCE STATUS: 1 - Symptomatic but completely ambulatory  There were no vitals filed for this visit. There were no vitals filed for this visit.  BREAST: No palpable masses or nodules in either right or left breasts. No palpable axillary supraclavicular or infraclavicular adenopathy no breast tenderness or nipple discharge. (exam performed in the presence of a chaperone)  LABORATORY DATA:  I have reviewed the data as listed CMP Latest Ref Rng & Units 06/20/2020 02/20/2020 07/13/2019  Glucose 70 - 99 mg/dL 88 84 83  BUN 8 - 23 mg/dL $Remove'17 20 13  'HHOLkDk$ Creatinine 0.44 - 1.00 mg/dL 1.09(H) 1.26(H) 0.90  Sodium 135 - 145 mmol/L 142 141 138  Potassium 3.5 - 5.1 mmol/L 4.4 4.1 4.1  Chloride 98 - 111 mmol/L 108 108 108  CO2 22 - 32 mmol/L 23 24 21(L)  Calcium 8.9 - 10.3 mg/dL 10.2 9.7 8.8(L)  Total Protein 6.5 - 8.1 g/dL 7.3 7.5 -  Total Bilirubin 0.3 - 1.2 mg/dL 0.5 0.4 -  Alkaline Phos 38 - 126 U/L 100 90 -  AST 15 - 41 U/L 14(L) 13(L) -  ALT 0 - 44 U/L 11 11 -    Lab Results  Component Value Date   WBC 9.7 06/20/2020  HGB 13.4 06/20/2020   HCT 39.6 06/20/2020   MCV 84.8 06/20/2020   PLT 314 06/20/2020   NEUTROABS 5.1 06/20/2020    ASSESSMENT & PLAN:  Malignant neoplasm of upper-outer quadrant of right breast in female, estrogen receptor positive ([place]) H/O right breast cancer on 2017 and 1999: Lumpectomy.  (Bilateral mastectomies in 2017: Seen by Dr. Lavera Guise, could not tolerate antiestrogen therapy) Chest CT on 06/06/19 showed a mass in the right axilla and a mass in the right lower lung lobe. PET scan on 07/03/19 showed a right lower lobe pulmonary nodule concerning for malignancy, the right  axillary mass consistent with local breast cancer recurrence, and small hypermetabolic right axillary and neck nodes consistent with metastatic adenopathy. Biopsy of the right axilla on 07/13/19 showed carcinoma consistent with breast carcinoma, HER-2 negative (1+), ER+ 95%, PR+ 75%, Ki67 25%. ---------------------------------------------------------------------------------------------------------------------------------------------- Current treatment: Anastrozole 1 mg daily started 08/14/2019  Severe pain: Marked improvement   Because of the lung nodule highly suspicious for metastatic cancer and difficulty with performing the axillary surgery, we decided to treat her with palliative intent antiestrogen therapy.   She is moved in with her daughter. CT Chest 02/20/20: RLL lung mass is similar in size (3.4 cm), Multiple non specific new lung nodules 5 mm or less, T2 sclerotic lesion same CT chest abdomen and pelvis 06/21/2020: Persistent right axillary lymphadenopathy stable.  Spiculated right lower lobe lung mass, slow growth.  Minimal interval enlargement of small right lower lobe lung nodule CT chest 10/21/2020: Interval enlargement of the spiculated subpleural mass from 3.1 cm to 3.6 cm (?  Lung primary), bulky right axillary lymph nodes measuring 4 cm (previously 2.5 cm)   Radiology review: There is clear-cut progression of disease both in the lung mass as well as the axillary lymph nodes.   Lung nodule: We will obtain a PET CT scan and then present her in the thoracic tumor board.  We would like to get a biopsy of this nodule to see if it is metastatic breast cancer or a primary lung cancer.  If there is primary lung cancer, there could be a potential for resection or stereotactic radiation.  If it is metastatic breast cancer then we will continue with her systemic therapy.   Recommendation: 1.  PET/CT scan and thoracic tumor board presentation to determine the best way we can biopsy the lung  mass 2. Change treatment to Verzenio with anastrozole (I am starting her on 100 mg of Verzenio twice a day to make sure she tolerates the treatment well)  Abemaciclib counseling: I discussed at length the risks and benefits of Abemaciclib in combination with letrozole. Adverse effects of Abemaciclib include decreasing neutrophil count, pneumonia, blood clots in lungs as well as nausea and GI symptoms. Side effects of letrozole include hot flashes, muscle aches and pains, uterine bleeding/spotting/cancer, osteoporosis, risk of blood clots.   Follow-up in 3 weeks with her pharmacy provider Jenny Reichmann. Labs will be done today to get a baseline.    No orders of the defined types were placed in this encounter.  The patient has a good understanding of the overall plan. she agrees with it. she will call with any problems that may develop before the next visit here.  Total time spent: 45 mins including face to face time and time spent for planning, charting and coordination of care  Rulon Eisenmenger, MD, MPH 10/22/2020  I, Thana Ates, am acting as scribe for Dr. Nicholas Lose.  I have reviewed the above  documentation for accuracy and completeness, and I agree with the above.

## 2020-10-22 ENCOUNTER — Inpatient Hospital Stay: Payer: Medicare HMO

## 2020-10-22 ENCOUNTER — Telehealth: Payer: Self-pay

## 2020-10-22 ENCOUNTER — Other Ambulatory Visit (HOSPITAL_COMMUNITY): Payer: Self-pay

## 2020-10-22 ENCOUNTER — Inpatient Hospital Stay: Payer: Medicare HMO | Attending: Hematology and Oncology | Admitting: Hematology and Oncology

## 2020-10-22 ENCOUNTER — Other Ambulatory Visit: Payer: Self-pay

## 2020-10-22 VITALS — BP 165/51 | HR 91 | Temp 97.7°F | Resp 18 | Ht 62.0 in | Wt 136.3 lb

## 2020-10-22 DIAGNOSIS — Z9013 Acquired absence of bilateral breasts and nipples: Secondary | ICD-10-CM | POA: Diagnosis not present

## 2020-10-22 DIAGNOSIS — Z880 Allergy status to penicillin: Secondary | ICD-10-CM | POA: Diagnosis not present

## 2020-10-22 DIAGNOSIS — R590 Localized enlarged lymph nodes: Secondary | ICD-10-CM | POA: Insufficient documentation

## 2020-10-22 DIAGNOSIS — Z885 Allergy status to narcotic agent status: Secondary | ICD-10-CM | POA: Insufficient documentation

## 2020-10-22 DIAGNOSIS — R911 Solitary pulmonary nodule: Secondary | ICD-10-CM | POA: Insufficient documentation

## 2020-10-22 DIAGNOSIS — C50411 Malignant neoplasm of upper-outer quadrant of right female breast: Secondary | ICD-10-CM

## 2020-10-22 DIAGNOSIS — Z886 Allergy status to analgesic agent status: Secondary | ICD-10-CM | POA: Diagnosis not present

## 2020-10-22 DIAGNOSIS — Z170 Estrogen receptor positive status [ER+]: Secondary | ICD-10-CM | POA: Diagnosis not present

## 2020-10-22 DIAGNOSIS — Z79811 Long term (current) use of aromatase inhibitors: Secondary | ICD-10-CM | POA: Diagnosis not present

## 2020-10-22 LAB — CBC WITH DIFFERENTIAL (CANCER CENTER ONLY)
Abs Immature Granulocytes: 0.1 10*3/uL — ABNORMAL HIGH (ref 0.00–0.07)
Basophils Absolute: 0.1 10*3/uL (ref 0.0–0.1)
Basophils Relative: 1 %
Eosinophils Absolute: 0.1 10*3/uL (ref 0.0–0.5)
Eosinophils Relative: 1 %
HCT: 38.2 % (ref 36.0–46.0)
Hemoglobin: 12.7 g/dL (ref 12.0–15.0)
Immature Granulocytes: 1 %
Lymphocytes Relative: 36 %
Lymphs Abs: 5 10*3/uL — ABNORMAL HIGH (ref 0.7–4.0)
MCH: 28.3 pg (ref 26.0–34.0)
MCHC: 33.2 g/dL (ref 30.0–36.0)
MCV: 85.1 fL (ref 80.0–100.0)
Monocytes Absolute: 0.9 10*3/uL (ref 0.1–1.0)
Monocytes Relative: 6 %
Neutro Abs: 7.7 10*3/uL (ref 1.7–7.7)
Neutrophils Relative %: 55 %
Platelet Count: 367 10*3/uL (ref 150–400)
RBC: 4.49 MIL/uL (ref 3.87–5.11)
RDW: 14 % (ref 11.5–15.5)
WBC Count: 13.9 10*3/uL — ABNORMAL HIGH (ref 4.0–10.5)
nRBC: 0 % (ref 0.0–0.2)

## 2020-10-22 LAB — CMP (CANCER CENTER ONLY)
ALT: 13 U/L (ref 0–44)
AST: 16 U/L (ref 15–41)
Albumin: 4.3 g/dL (ref 3.5–5.0)
Alkaline Phosphatase: 83 U/L (ref 38–126)
Anion gap: 10 (ref 5–15)
BUN: 25 mg/dL — ABNORMAL HIGH (ref 8–23)
CO2: 25 mmol/L (ref 22–32)
Calcium: 10.4 mg/dL — ABNORMAL HIGH (ref 8.9–10.3)
Chloride: 108 mmol/L (ref 98–111)
Creatinine: 1.13 mg/dL — ABNORMAL HIGH (ref 0.44–1.00)
GFR, Estimated: 50 mL/min — ABNORMAL LOW (ref >60)
Glucose, Bld: 77 mg/dL (ref 70–99)
Potassium: 4 mmol/L (ref 3.5–5.1)
Sodium: 143 mmol/L (ref 135–145)
Total Bilirubin: 0.3 mg/dL (ref 0.3–1.2)
Total Protein: 7.7 g/dL (ref 6.5–8.1)

## 2020-10-22 MED ORDER — ABEMACICLIB 100 MG PO TABS
100.0000 mg | ORAL_TABLET | Freq: Two times a day (BID) | ORAL | 3 refills | Status: DC
  Filled 2020-10-22: qty 60, 30d supply, fill #0

## 2020-10-22 NOTE — Telephone Encounter (Signed)
Oral Oncology Pharmacist Encounter  Received new prescription for abemaciclib (Verzenio) for the treatment of HR+, HER2- breast cancer suspicious for metastatic cancer in conjunction with letrozole, planned duration until disease progression or unacceptable toxicity.  Prescription dose and frequency assessed, no interventions needed. Dose decreased by MD to make sure the patient tolerates the medication.   Labs from 10/22/2020 assessed, no interventions needed.  Current medication list in Epic reviewed, DDIs with verzenio identified: none  Evaluated chart and no patient barriers to medication adherence noted.   Patient agreement for treatment documented in MD note on 10/22/2020.  Prescription has been e-scribed to the [facility] for benefits analysis and approval.  Oral Oncology Clinic will continue to follow for insurance authorization, copayment issues, initial counseling and start date.  Drema Halon, PharmD Hematology/Oncology Clinical Pharmacist [facility] [phone] 10/22/2020 10:28 AM

## 2020-10-22 NOTE — Telephone Encounter (Signed)
Oral Oncology Patient Advocate Encounter   Received notification from [facility] that prior authorization for Verzenio is required.   PA submitted on CoverMyMeds Key [id number] Status is pending   Oral Oncology Clinic will continue to follow.  [place] Patient [place] Phone [phone] Fax [phone] 10/22/2020 10:40 AM

## 2020-10-22 NOTE — Assessment & Plan Note (Addendum)
H/Oright breast cancer on 2017and 1999: Lumpectomy.(Bilateral mastectomies in 2017: Seen by Dr. Lavera Guise, could not tolerate antiestrogen therapy) Chest CT on 06/06/19 showed a mass in the right axilla and a mass in the right lower lung lobe. PET scan on 07/03/19 showed a right lower lobe pulmonary nodule concerning for malignancy, the right axillary mass consistent with local breast cancer recurrence, and small hypermetabolic right axillary and neck nodes consistent with metastatic adenopathy. Biopsy of the right axilla on 07/13/19 showed carcinoma consistent with breast carcinoma, HER-2 negative (1+), ER+ 95%, PR+ 75%, Ki67 25%. ---------------------------------------------------------------------------------------------------------------------------------------------- Current treatment: Anastrozole 1 mg daily started 08/14/2019  Severe pain: Marked improvement  Because of the lung nodule highly suspicious for metastatic cancer and difficulty with performing the axillary surgery, we decided to treat her with palliative intent antiestrogen therapy.  She is movedin with her daughter. CT Chest 02/20/20:RLL lung mass is similar in size (3.4 cm), Multiple non specific new lung nodules 5 mm or less, T2 sclerotic lesion same CT chest abdomen and pelvis 06/21/2020: Persistent right axillary lymphadenopathy stable.  Spiculated right lower lobe lung mass, slow growth.  Minimal interval enlargement of small right lower lobe lung nodule CT chest 10/21/2020: Interval enlargement of the spiculated subpleural mass from 3.1 cm to 3.6 cm (?  Lung primary), bulky right axillary lymph nodes measuring 4 cm (previously 2.5 cm)  Radiology review: There is clear-cut progression of disease both in the lung mass as well as the axillary lymph nodes.  Lung nodule: I discussed with her about 3 options.  First option is to biopsy second would be to refer her to cardiothoracic and third option would be to watch and  monitor.  She prefer to watch and monitor.  Recommendation: 1.  CT-guided biopsy of the subpleural lung nodule 2. Change treatment to Verzenio with letrozole  Follow-up in 3 months with scans

## 2020-10-23 NOTE — Telephone Encounter (Signed)
Oral Oncology Patient Advocate Encounter  Prior Authorization for Audrey [name] has been approved.    PA# [id number] Effective dates: 10/22/20 through 04/20/21  Patients co-pay is $2998  Oral Oncology Clinic will continue to follow.   [place] Patient [place] Phone [phone] Fax [phone] 10/23/2020 10:49 AM

## 2020-10-24 ENCOUNTER — Telehealth: Payer: Self-pay

## 2020-10-24 NOTE — Telephone Encounter (Signed)
Oral Oncology Patient Advocate Encounter  Met patient in [place] to complete an application for [employer] Patient Assistance Program in an effort to reduce the patient's out of pocket expense for Verzenio to $0.    Application completed and faxed to [phone].   [employer] patient assistance phone number for follow up is [phone].   This encounter will be updated until final determination.     [place] [name] Phone [phone] Fax [phone] 10/24/2020 12:20 PM

## 2020-10-27 MED ORDER — ABEMACICLIB 100 MG PO TABS: 100.0000 mg | ORAL_TABLET | Freq: Two times a day (BID) | ORAL | 3 refills | Status: DC

## 2020-10-27 NOTE — Telephone Encounter (Signed)
Patient is approved for Verzenio at no cost from [employer] 10/26/20-01/10/21  [facility] uses [place] Patient [place] Phone [phone] Fax [phone] 10/27/2020 10:55 AM

## 2020-10-29 NOTE — Telephone Encounter (Signed)
Spoke with patient to let her know she has to call [employer] to set up shipment at  [phone].  [place] Patient [place] Phone [phone] Fax [phone] 10/29/2020 12:50 PM

## 2020-10-30 NOTE — Telephone Encounter (Signed)
Oral Chemotherapy Pharmacist Encounter  I spoke with patient for overview of: Verzenio for the treatment of metastatic, hormone-receptor positive breast cancer, in combination with letrozole, planned duration until disease progression or unacceptable toxicity.   Counseled patient on administration, dosing, side effects, monitoring, drug-food interactions, safe handling, storage, and disposal.  Patient will take Verzenio 100mg  tablets, 1 tablet by mouth twice daily without regard to food.  Patient knows to avoid grapefruit and grapefruit juice.  Verzenio start date: Patient will start once she receives the medication Patient will call today, 10/30/20, to set up shipment.   Adverse effects include but are not limited to: diarrhea, fatigue, nausea, abdominal pain, hair loss, decreased blood counts, and increased liver function tests, and joint pains. Severe, life-threatening, and/or fatal interstitial lung disease (ILD) and/or pneumonitis may occur with CDK 4/6 inhibitors.  Patient has anti-emetic on hand and knows to take it if nausea develops.   Patient will obtain anti diarrheal and alert the office of 4 or more loose stools above baseline.  Reviewed with patient importance of keeping a medication schedule and plan for any missed doses. No barriers to medication adherence identified.  Medication reconciliation performed and medication/allergy list updated.  Insurance authorization for [employer] has been obtained.  Patient informed the pharmacy will reach out 5-7 days prior to needing next fill of Verzenio to coordinate continued medication acquisition to prevent break in therapy.  All questions answered.  Mrs. Broadus voiced understanding and appreciation.   Medication education handout placed in mail for patient. Patient knows to call the office with questions or concerns. Oral Chemotherapy Clinic phone number provided to patient.   Drema Halon, PharmD Hematology/Oncology  Clinical Pharmacist [facility] [place] [facility] [phone]  10/30/2020   9:16 AM

## 2020-11-05 ENCOUNTER — Ambulatory Visit (HOSPITAL_COMMUNITY)
Admission: RE | Admit: 2020-11-05 | Discharge: 2020-11-05 | Disposition: A | Discharge status: Home / Self Care | Payer: Medicare HMO | Source: Ambulatory Visit | Attending: Hematology and Oncology | Admitting: Hematology and Oncology

## 2020-11-05 ENCOUNTER — Other Ambulatory Visit: Payer: Self-pay

## 2020-11-05 DIAGNOSIS — Z08 Encounter for follow-up examination after completed treatment for malignant neoplasm: Secondary | ICD-10-CM | POA: Insufficient documentation

## 2020-11-05 DIAGNOSIS — R911 Solitary pulmonary nodule: Secondary | ICD-10-CM | POA: Diagnosis present

## 2020-11-05 DIAGNOSIS — Z853 Personal history of malignant neoplasm of breast: Secondary | ICD-10-CM | POA: Diagnosis not present

## 2020-11-05 LAB — GLUCOSE, CAPILLARY: Glucose-Capillary: 96 mg/dL (ref 70–99)

## 2020-11-05 MED ORDER — FLUDEOXYGLUCOSE F - 18 (FDG) INJECTION
6.8000 | Freq: Once | INTRAVENOUS | Status: AC | PRN
  Administered 2020-11-05: 09:00:00 6.79 via INTRAVENOUS

## 2020-11-12 ENCOUNTER — Other Ambulatory Visit: Payer: Self-pay | Admitting: *Deleted

## 2020-11-12 DIAGNOSIS — Z170 Estrogen receptor positive status [ER+]: Secondary | ICD-10-CM

## 2020-11-12 DIAGNOSIS — C50411 Malignant neoplasm of upper-outer quadrant of right female breast: Secondary | ICD-10-CM

## 2020-11-13 ENCOUNTER — Inpatient Hospital Stay: Payer: Medicare HMO | Attending: Hematology and Oncology

## 2020-11-13 ENCOUNTER — Encounter: Payer: Self-pay | Admitting: *Deleted

## 2020-11-13 ENCOUNTER — Inpatient Hospital Stay: Payer: Medicare HMO | Admitting: Pharmacist

## 2020-11-13 ENCOUNTER — Telehealth: Payer: Self-pay | Admitting: *Deleted

## 2020-11-13 ENCOUNTER — Telehealth: Payer: Self-pay | Admitting: Acute Care

## 2020-11-13 ENCOUNTER — Other Ambulatory Visit: Payer: Self-pay

## 2020-11-13 VITALS — BP 146/51 | HR 90 | Temp 97.8°F | Resp 18 | Ht 62.0 in | Wt 135.6 lb

## 2020-11-13 DIAGNOSIS — R918 Other nonspecific abnormal finding of lung field: Secondary | ICD-10-CM | POA: Diagnosis not present

## 2020-11-13 DIAGNOSIS — C773 Secondary and unspecified malignant neoplasm of axilla and upper limb lymph nodes: Secondary | ICD-10-CM | POA: Insufficient documentation

## 2020-11-13 DIAGNOSIS — Z79811 Long term (current) use of aromatase inhibitors: Secondary | ICD-10-CM | POA: Diagnosis not present

## 2020-11-13 DIAGNOSIS — C50411 Malignant neoplasm of upper-outer quadrant of right female breast: Secondary | ICD-10-CM | POA: Insufficient documentation

## 2020-11-13 DIAGNOSIS — Z885 Allergy status to narcotic agent status: Secondary | ICD-10-CM | POA: Diagnosis not present

## 2020-11-13 DIAGNOSIS — Z886 Allergy status to analgesic agent status: Secondary | ICD-10-CM | POA: Insufficient documentation

## 2020-11-13 DIAGNOSIS — R590 Localized enlarged lymph nodes: Secondary | ICD-10-CM | POA: Insufficient documentation

## 2020-11-13 DIAGNOSIS — Z170 Estrogen receptor positive status [ER+]: Secondary | ICD-10-CM | POA: Insufficient documentation

## 2020-11-13 DIAGNOSIS — Z79899 Other long term (current) drug therapy: Secondary | ICD-10-CM | POA: Insufficient documentation

## 2020-11-13 DIAGNOSIS — R197 Diarrhea, unspecified: Secondary | ICD-10-CM | POA: Insufficient documentation

## 2020-11-13 DIAGNOSIS — Z9013 Acquired absence of bilateral breasts and nipples: Secondary | ICD-10-CM | POA: Insufficient documentation

## 2020-11-13 DIAGNOSIS — Z880 Allergy status to penicillin: Secondary | ICD-10-CM | POA: Insufficient documentation

## 2020-11-13 LAB — CMP (CANCER CENTER ONLY)
ALT: 8 U/L (ref 0–44)
AST: 13 U/L — ABNORMAL LOW (ref 15–41)
Albumin: 3.9 g/dL (ref 3.5–5.0)
Alkaline Phosphatase: 91 U/L (ref 38–126)
Anion gap: 10 (ref 5–15)
BUN: 19 mg/dL (ref 8–23)
CO2: 23 mmol/L (ref 22–32)
Calcium: 9.7 mg/dL (ref 8.9–10.3)
Chloride: 106 mmol/L (ref 98–111)
Creatinine: 1.27 mg/dL — ABNORMAL HIGH (ref 0.44–1.00)
GFR, Estimated: 43 mL/min — ABNORMAL LOW (ref >60)
Glucose, Bld: 89 mg/dL (ref 70–99)
Potassium: 4.3 mmol/L (ref 3.5–5.1)
Sodium: 139 mmol/L (ref 135–145)
Total Bilirubin: 0.4 mg/dL (ref 0.3–1.2)
Total Protein: 6.9 g/dL (ref 6.5–8.1)

## 2020-11-13 LAB — CBC WITH DIFFERENTIAL (CANCER CENTER ONLY)
Abs Immature Granulocytes: 0.03 10*3/uL (ref 0.00–0.07)
Basophils Absolute: 0.1 10*3/uL (ref 0.0–0.1)
Basophils Relative: 1 %
Eosinophils Absolute: 0.1 10*3/uL (ref 0.0–0.5)
Eosinophils Relative: 2 %
HCT: 36.2 % (ref 36.0–46.0)
Hemoglobin: 12.1 g/dL (ref 12.0–15.0)
Immature Granulocytes: 0 %
Lymphocytes Relative: 36 %
Lymphs Abs: 3 10*3/uL (ref 0.7–4.0)
MCH: 29 pg (ref 26.0–34.0)
MCHC: 33.4 g/dL (ref 30.0–36.0)
MCV: 86.8 fL (ref 80.0–100.0)
Monocytes Absolute: 0.2 10*3/uL (ref 0.1–1.0)
Monocytes Relative: 2 %
Neutro Abs: 4.9 10*3/uL (ref 1.7–7.7)
Neutrophils Relative %: 59 %
Platelet Count: 282 10*3/uL (ref 150–400)
RBC: 4.17 MIL/uL (ref 3.87–5.11)
RDW: 14 % (ref 11.5–15.5)
WBC Count: 8.3 10*3/uL (ref 4.0–10.5)
nRBC: 0 % (ref 0.0–0.2)

## 2020-11-13 NOTE — Telephone Encounter (Signed)
This patient was reviewed in the [facility] conference this morning. Recommendation of the group was for biopsy of the lung and Mediastinal lymph node to diagnose and stage with one procedure. ( Suspect this is a new lung primary).   Dr. Lamonte Sakai called the patient to get her in to see him tomorrow, but she told him she had too much going on and did not accept the appointment.   I have called [place] office. There was a VM recording. I left them a message  letting them know  Dr. Lamonte Sakai reached out to the patient  based on [place] recommendations this morning. I explained that the patient was not wanting to come in to be seen to plan biopsy ( Per recommendations [place]). I have asked them to reach out to her , and for them to contact us  when she is ready to be scheduled, as we are happy to see her and  get procedure scheduled.

## 2020-11-13 NOTE — Telephone Encounter (Signed)
Received call from Barbaraann Barthel, NP stating pt declined office visit with Pulmonary for tomorrow.  RN placed call to pt to confirm.  Pt states she is experiencing a bout of diarrhea related to diverticulitis and is fatigued and would prefer to be seen next week.  RN alerted Pulmonary office of pt wishes and receptionist states she will pass the message to Barbaraann Barthel, NP.

## 2020-11-13 NOTE — Progress Notes (Signed)
[name]       Telephone: [phone]?Fax: [phone]   Oncology Clinical Pharmacist Practitioner Initial Assessment  Audrey [name] is a 80 y.o. female with a diagnosis of metastatic breast cancer.  Indication/Regimen Abemaciclib (Verzenio) is being used appropriately for treatment of metastatic breast cancer by Dr. Nicholas Lose.    Wt Readings from Last 1 Encounters:  11/13/20 135 lb 9.6 oz (61.5 kg)    Estimated body surface area is 1.64 meters squared as calculated from the following:   Height as of this encounter: 5\' 2"  (1.575 m).   Weight as of this encounter: 135 lb 9.6 oz (61.5 kg).  The dosing regimen is 100 mg by mouth every 12 hours on days 1 to 28 of a 28-day cycle. It is planned to continue until disease progression or unacceptable toxicity. Audrey [name] was seen by clinical pharmacy today to establish care after referred by Dr. Lindi Adie. She is a new start abemaciclib, having started on 11/03/20. She last saw Dr. Lindi Adie on 10/22/20 and currently receives her abemaciclib through free drug [employer]. Audrey [name] was accompanied by her daughter Levada Dy today.  Audrey [name] states she is tolerating the abemaciclib poorly.  She is having multiple episodes of diarrhea a day, sometimes in the middle of the night.  The diarrhea started about 2 days after starting abemaciclib. She took loperamide once (2 mg) but states this made her constipated for 3 days.  She has a history of diverticulitis prior to starting abemaciclib.  She feels her latest episode of diverticulitis was brought by the constipation and diarrhea.  For her diverticulitis, she states she has been on doxycycline in the past and a pain medication that she did not recall the name of. She will contact the clinic with the name of this medication when she gets home or bring in the name at her next visit.  She had not been taking the pain medication for the diverticulitis because her PCP said there may  be an interaction with the abemaciclib.  Can analyze this possible interaction once we know the name of the agent.   Her current medications were reviewed today and no significant interactions were found.  Audrey [name] states she is also on vitamin c, vitamin D, and zinc but did not know the doses/frequency of these agents.  She will notify the clinic of these agents so they can be entered into her medical record.  Reviewed food option recommendations while on abemaciclib to hopefully reduce diarrhea (BRAT diet, eating smaller meals, avoiding high fat/grease foods, etc)  After discussion with patient, it was decided to hold abemaciclib at this time for 2 weeks until she sees Dr. Lindi Adie again with labs.  She will also have her magnesium checked at this time.  If her symptoms resolve, she may benefit from taking the abemaciclib 100 mg daily until she finishes up her current prescription and then go to 50 mg every 12 hours.  She does states that she was not taking the abemaciclib every 12 hours but rather taking it at 11 am and 6 pm.  Explained that this also may have contributed to the diarrhea.  Reeducated that abemaciclib should be taken every 12 hours with or without food.  She knows to avoid grapefruit products. Audrey [name] asked about the PET scan done on 11/05/20 and reviewed the impression with her and her daughter from the radiologist.  They asked if a biopsy may be warranted and explained that this  may be better discussed with Dr. Lindi Adie. This communication will be forwarded to him.   Dose Modifications 100 mg PO BID initially. Due to diarrhea will likely dose reduce to 100 mg daily until current prescription runs out. Restart in 2 weeks if symptoms have resolved or are at an acceptable level.  Then 50 mg PO BID as tolerated.   Access Assessment Audrey [name] will be receiving abemaciclib through  [employer] free drug program Insurance Concerns: None Start date if known:  11/03/20  Allergies Allergies  Allergen Reactions   Ativan [Lorazepam] Itching   Aspirin Other (See Comments)    Stomach pain   Codeine Nausea And Vomiting   Penicillins Rash    Laboratory Data CBC EXTENDED Latest Ref Rng & Units 11/13/2020 10/22/2020 06/20/2020  WBC 4.0 - 10.5 K/uL 8.3 13.9(H) 9.7  RBC 3.87 - 5.11 MIL/uL 4.17 4.49 4.67  HGB 12.0 - 15.0 g/dL 12.1 12.7 13.4  HCT 36.0 - 46.0 % 36.2 38.2 39.6  PLT 150 - 400 K/uL 282 367 314  NEUTROABS 1.7 - 7.7 K/uL 4.9 7.7 5.1  LYMPHSABS 0.7 - 4.0 K/uL 3.0 5.0(H) 3.8    CMP Latest Ref Rng & Units 11/13/2020 10/22/2020 06/20/2020  Glucose 70 - 99 mg/dL 89 77 88  BUN 8 - 23 mg/dL 19 25(H) 17  Creatinine 0.44 - 1.00 mg/dL 1.27(H) 1.13(H) 1.09(H)  Sodium 135 - 145 mmol/L 139 143 142  Potassium 3.5 - 5.1 mmol/L 4.3 4.0 4.4  Chloride 98 - 111 mmol/L 106 108 108  CO2 22 - 32 mmol/L 23 25 23   Calcium 8.9 - 10.3 mg/dL 9.7 10.4(H) 10.2  Total Protein 6.5 - 8.1 g/dL 6.9 7.7 7.3  Total Bilirubin 0.3 - 1.2 mg/dL 0.4 0.3 0.5  Alkaline Phos 38 - 126 U/L 91 83 100  AST 15 - 41 U/L 13(L) 16 14(L)  ALT 0 - 44 U/L 8 13 11     Contraindications Contraindications were reviewed? Yes Contraindications to therapy were identified? No   Safety Precautions The following safety precautions for the use of abemaciclib were reviewed:  Diarrhea: we reviewed that diarrhea is common with abemaciclib and confirmed that she does have loperamide (Imodium) at home.  We reviewed how to take this medication PRN and gave her information on abemaciclib. She may benefit from another agent if she continues to not tolerate loperamide once she resumes her abemaciclib. Neutropenia: we discussed the importance of having a thermometer and what the Centers for Disease Control and Prevention (CDC) considers a fever which is 100.42F (38C) or higher.  Gave patient 24/7 triage line to call if any fevers or symptoms ILD/Pneumonitis: we reviewed potential symptoms including cough,  shortness, and fatigue. Hepatotoxicity: reviewed to contact clinic for RUQ pain that will not subside, yellowing of eyes/skin Venous thromboembolism (VTE): reviewed signs of deep vein thrombosis (DVT) such as leg swelling, redness, pain, or tenderness and signs of pulmonary embolism (PE) such as shortness of breath, rapid or irregular heartbeat, cough, chest pain, or lightheadedness Reviewed to take the medication every 12 hours (with food sometimes can be easier on the stomach) and to take it at the same time every day. Discussed proper storage and handling of abemaciclib  Medication Reconciliation Current Outpatient Medications  Medication Sig Dispense Refill   abemaciclib (VERZENIO) 100 MG tablet Take 1 tablet (100 mg total) by mouth 2 (two) times daily. Swallow tablets whole. Do not chew, crush, or split tablets before swallowing. 60 tablet 3   amLODipine (NORVASC)  2.5 MG tablet Take 2.5 mg by mouth daily.     anastrozole (ARIMIDEX) 1 MG tablet Take 1 tablet (1 mg total) by mouth daily. 90 tablet 3   ketotifen (ZADITOR) 0.025 % ophthalmic solution Place 1 drop into both eyes 2 (two) times daily as needed (dry eyes).     lansoprazole (PREVACID) 15 MG capsule Take 15 mg by mouth daily at 12 noon.     gabapentin (NEURONTIN) 300 MG capsule Take 1 capsule (300 mg total) by mouth at bedtime. (Patient not taking: Reported on 11/13/2020) 90 capsule 3   No current facility-administered medications for this visit.    Medication reconciliation is based on the patient's most recent medication list in the electronic medical record (EMR) including herbal products and OTC medications.   The patient's medication list was reviewed today with the patient? Yes   Drug-drug interactions (DDIs) DDIs were evaluated? Yes DDIs identified? No   Drug-Food Interactions Drug-food interactions were evaluated? Yes Drug-food interactions identified?  Avoid grapefruit products  Follow-up Plan  HOLD abemaciclib for  now, magnesium standing order lab added for future appointments F/U in 2 weeks with labs / visit with Dr. Lindi Adie. At that time may discuss PET scan results in detail and biopsy if applicable Labs / visit with pharmacy clinic in 4 weeks (12/11/20) Audrey [name] and daughter will contact clinic with pain medication and vitamin/zinc doses/frequencies  Audrey [name] participated in the discussion, expressed understanding, and voiced agreement with the above plan. All questions were answered to her satisfaction. The patient was advised to contact the clinic at [phone] with any questions or concerns prior to her return visit.   I spent 60 minutes assessing the patient.  Raina Mina, RPH-CPP, 11/13/2020 10:47 AM

## 2020-11-13 NOTE — Progress Notes (Signed)
Per Dr. Lindi Adie, referral to pulmonary completed.

## 2020-11-13 NOTE — Telephone Encounter (Signed)
Will work on getting her in a nodule slot next week

## 2020-11-14 ENCOUNTER — Telehealth: Payer: Self-pay | Admitting: *Deleted

## 2020-11-14 ENCOUNTER — Telehealth: Payer: Self-pay | Admitting: Pharmacist

## 2020-11-14 NOTE — Telephone Encounter (Signed)
Received call from pt with complaint of diarrhea x8 episodes yesterday. Pt states she has a hx of diverticulitis and requesting abx be sent to pharmacy. MD is not in the office today and RN offered pt appt with PA in [facility].  Pt politely refused appt and states she will follow up with her PCP who has tx her in the past for diverticulitis.  RN encouraged pt to increase fluid intake as well as OTC imodium.  RN also educated pt to call the office if she would like to be seen in [facility] today for further evaluation.  Pt verbalized understanding.

## 2020-11-14 NOTE — Telephone Encounter (Signed)
Scheduled appointment per 11/04 los. [facility] is aware.

## 2020-11-17 ENCOUNTER — Telehealth: Payer: Self-pay | Admitting: *Deleted

## 2020-11-17 NOTE — Telephone Encounter (Signed)
Received call from pt stating she does not wish to f/u with pulmonologist this week.  Pt states she is experiencing a flare up of her diverticulitis and is working with her PCP on management.  RN encouraged pt to call Pulmonologist office to reschedule appt.  Pt verbalized understanding.

## 2020-11-20 ENCOUNTER — Ambulatory Visit: Payer: Medicare HMO | Admitting: Emergency Medicine

## 2020-11-26 ENCOUNTER — Other Ambulatory Visit: Payer: Self-pay | Admitting: *Deleted

## 2020-11-26 DIAGNOSIS — Z170 Estrogen receptor positive status [ER+]: Secondary | ICD-10-CM

## 2020-11-26 NOTE — Progress Notes (Signed)
Patient Care Team: [name], MD as PCP - General (Family Medicine)  DIAGNOSIS:    ICD-10-CM   1. Malignant neoplasm of upper-outer quadrant of right breast in female, estrogen receptor positive ([place])  C50.411    Z17.0       SUMMARY OF ONCOLOGIC HISTORY: Oncology History  Malignant neoplasm of upper-outer quadrant of right breast in female, estrogen receptor positive ([place])  07/13/2019 Initial Diagnosis   History of right breast cancer on 2017. Chest CT on 06/06/19 showed a mass in the right axilla and a mass in the right lower lung lobe. PET scan on 07/03/19 showed a right lower lobe pulmonary nodule concerning for malignancy, the right axillary mass consistent with local breast cancer recurrence, and small hypermetabolic right axillary and neck nodes consistent with metastatic adenopathy. Biopsy of the right axilla on 07/13/19 showed carcinoma consistent with breast carcinoma, HER-2 negative (1+), ER+ 95%, PR+ 75%, Ki67 25%.     CHIEF COMPLIANT: Follow-up recurrent right breast cancer  INTERVAL HISTORY: Audrey [name] is a 80 y.o. with above-mentioned history of recurrent right breast cancer with right axillary masses currently on anastrozole. She reports to clinic today for follow-up. PET on 11/05/2020 showed progression of hypermetabolic right axillary nodal metastasis, new hypermetabolic mediastinal and right hilar nodal metastasis, and interval increase in size of hypermetabolic right lower lobe nodular mass. She presents to the clinic today for follow-up.   ALLERGIES:  is allergic to ativan [lorazepam], aspirin, codeine, and penicillins.  MEDICATIONS:  Current Outpatient Medications  Medication Sig Dispense Refill   abemaciclib (VERZENIO) 100 MG tablet Take 1 tablet (100 mg total) by mouth 2 (two) times daily. Swallow tablets whole. Do not chew, crush, or split tablets before swallowing. 60 tablet 3   amLODipine (NORVASC) 2.5 MG tablet Take 2.5 mg by mouth daily.      anastrozole (ARIMIDEX) 1 MG tablet Take 1 tablet (1 mg total) by mouth daily. 90 tablet 3   gabapentin (NEURONTIN) 300 MG capsule Take 1 capsule (300 mg total) by mouth at bedtime. (Patient not taking: Reported on 11/13/2020) 90 capsule 3   ketotifen (ZADITOR) 0.025 % ophthalmic solution Place 1 drop into both eyes 2 (two) times daily as needed (dry eyes).     lansoprazole (PREVACID) 15 MG capsule Take 15 mg by mouth daily at 12 noon.     No current facility-administered medications for this visit.    PHYSICAL EXAMINATION: ECOG PERFORMANCE STATUS: 1 - Symptomatic but completely ambulatory  Vitals:   11/27/20 1131  BP: (!) 156/62  Pulse: 90  Resp: 18  Temp: (!) 97.5 F (36.4 C)  SpO2: 99%   Filed Weights   11/27/20 1131  Weight: 136 lb 11.2 oz (62 kg)    BREAST: No palpable masses or nodules in either right or left breasts. No palpable axillary supraclavicular or infraclavicular adenopathy no breast tenderness or nipple discharge. (exam performed in the presence of a chaperone)  LABORATORY DATA:  I have reviewed the data as listed CMP Latest Ref Rng & Units 11/13/2020 10/22/2020 06/20/2020  Glucose 70 - 99 mg/dL 89 77 88  BUN 8 - 23 mg/dL 19 25(H) 17  Creatinine 0.44 - 1.00 mg/dL 1.27(H) 1.13(H) 1.09(H)  Sodium 135 - 145 mmol/L 139 143 142  Potassium 3.5 - 5.1 mmol/L 4.3 4.0 4.4  Chloride 98 - 111 mmol/L 106 108 108  CO2 22 - 32 mmol/L 23 25 23   Calcium 8.9 - 10.3 mg/dL 9.7 10.4(H) 10.2  Total Protein 6.5 -  8.1 g/dL 6.9 7.7 7.3  Total Bilirubin 0.3 - 1.2 mg/dL 0.4 0.3 0.5  Alkaline Phos 38 - 126 U/L 91 83 100  AST 15 - 41 U/L 13(L) 16 14(L)  ALT 0 - 44 U/L 8 13 11     Lab Results  Component Value Date   WBC 8.3 11/27/2020   HGB 11.8 (L) 11/27/2020   HCT 36.6 11/27/2020   MCV 89.1 11/27/2020   PLT 263 11/27/2020   NEUTROABS 3.9 11/27/2020    ASSESSMENT & PLAN:  Malignant neoplasm of upper-outer quadrant of right breast in female, estrogen receptor positive ([place]) H/O  right breast cancer on 2017 and 80: Lumpectomy.  (Bilateral mastectomies in 2017: Seen by Dr. Lavera Guise, could not tolerate antiestrogen therapy) Chest CT on 06/06/19 showed a mass in the right axilla and a mass in the right lower lung lobe. PET scan on 07/03/19 showed a right lower lobe pulmonary nodule concerning for malignancy, the right axillary mass consistent with local breast cancer recurrence, and small hypermetabolic right axillary and neck nodes consistent with metastatic adenopathy. Biopsy of the right axilla on 07/13/19 showed carcinoma consistent with breast carcinoma, HER-2 negative (1+), ER+ 95%, PR+ 75%, Ki67 25%. ---------------------------------------------------------------------------------------------------------------------------------------------- Current treatment: Anastrozole 1 mg daily started 08/14/2019 She is moved in with her daughter. CT Chest 02/20/20: RLL lung mass is similar in size (3.4 cm), Multiple non specific new lung nodules 5 mm or less, T2 sclerotic lesion same CT chest abdomen and pelvis 06/21/2020: Persistent right axillary lymphadenopathy stable.  Spiculated right lower lobe lung mass, slow growth.  Minimal interval enlargement of small right lower lobe lung nodule CT chest 10/21/2020: Interval enlargement of the spiculated subpleural mass from 3.1 cm to 3.6 cm (?  Lung primary), bulky right axillary lymph nodes measuring 4 cm (previously 2.5 cm) PET CT scan 11/07/2020: Progression of right axillary nodal metastases.  New hypermetabolic mediastinal and right hilar metastases compared to before.  Interval increase in the size of the hypermetabolic right lower lobe nodular mass  Radiology review: There is clear-cut progression of disease both in the lung mass as well as the axillary lymph nodes.   Lung nodule: We decided to treat her with CDK 4 and 6 inhibitors for another month and then obtain scan in January.  If the scans look like response then we will just  attribute the lung nodule in the lymphadenopathy to breast cancer.  If not then we will have her see pulmonary for biopsy.  We referred her to pulmonary but she did not see them because she had profound diarrhea.  Current treatment: Ibrance with anastrozole Verzenio toxicities: 1.  Severe diarrhea:  history of diverticulitis Because of severe diarrhea we decided to discontinue Verzenio.  We will switch her to [place]. We will start Ibrance at 100 mg daily 21 days on 7 days off.  Return to clinic in December to follow-up on adverse effects of Ibrance.   No orders of the defined types were placed in this encounter.  The patient has a good understanding of the overall plan. she agrees with it. she will call with any problems that may develop before the next visit here.  Total time spent: 20 mins including face to face time and time spent for planning, charting and coordination of care  Rulon Eisenmenger, MD, MPH 11/27/2020  I, Thana Ates, am acting as scribe for Dr. Nicholas Lose.  I have reviewed the above documentation for accuracy and completeness, and I agree with the above.

## 2020-11-27 ENCOUNTER — Telehealth: Payer: Self-pay

## 2020-11-27 ENCOUNTER — Other Ambulatory Visit: Payer: Self-pay

## 2020-11-27 ENCOUNTER — Inpatient Hospital Stay: Payer: Medicare HMO | Admitting: Hematology and Oncology

## 2020-11-27 ENCOUNTER — Other Ambulatory Visit (HOSPITAL_COMMUNITY): Payer: Self-pay

## 2020-11-27 ENCOUNTER — Inpatient Hospital Stay: Payer: Medicare HMO

## 2020-11-27 ENCOUNTER — Telehealth: Payer: Self-pay | Admitting: Pharmacy Technician

## 2020-11-27 VITALS — BP 156/62 | HR 90 | Temp 97.5°F | Resp 18 | Ht 62.0 in | Wt 136.7 lb

## 2020-11-27 DIAGNOSIS — Z170 Estrogen receptor positive status [ER+]: Secondary | ICD-10-CM

## 2020-11-27 DIAGNOSIS — R918 Other nonspecific abnormal finding of lung field: Secondary | ICD-10-CM

## 2020-11-27 DIAGNOSIS — C50411 Malignant neoplasm of upper-outer quadrant of right female breast: Secondary | ICD-10-CM

## 2020-11-27 LAB — CMP (CANCER CENTER ONLY)
ALT: 13 U/L (ref 0–44)
AST: 14 U/L — ABNORMAL LOW (ref 15–41)
Albumin: 4 g/dL (ref 3.5–5.0)
Alkaline Phosphatase: 104 U/L (ref 38–126)
Anion gap: 9 (ref 5–15)
BUN: 17 mg/dL (ref 8–23)
CO2: 26 mmol/L (ref 22–32)
Calcium: 9.3 mg/dL (ref 8.9–10.3)
Chloride: 106 mmol/L (ref 98–111)
Creatinine: 1.15 mg/dL — ABNORMAL HIGH (ref 0.44–1.00)
GFR, Estimated: 49 mL/min — ABNORMAL LOW (ref >60)
Glucose, Bld: 106 mg/dL — ABNORMAL HIGH (ref 70–99)
Potassium: 4.4 mmol/L (ref 3.5–5.1)
Sodium: 141 mmol/L (ref 135–145)
Total Bilirubin: <0.2 mg/dL — ABNORMAL LOW (ref 0.3–1.2)
Total Protein: 7.1 g/dL (ref 6.5–8.1)

## 2020-11-27 LAB — CBC WITH DIFFERENTIAL (CANCER CENTER ONLY)
Abs Immature Granulocytes: 0.03 10*3/uL (ref 0.00–0.07)
Basophils Absolute: 0.1 10*3/uL (ref 0.0–0.1)
Basophils Relative: 1 %
Eosinophils Absolute: 0.1 10*3/uL (ref 0.0–0.5)
Eosinophils Relative: 1 %
HCT: 36.6 % (ref 36.0–46.0)
Hemoglobin: 11.8 g/dL — ABNORMAL LOW (ref 12.0–15.0)
Immature Granulocytes: 0 %
Lymphocytes Relative: 45 %
Lymphs Abs: 3.8 10*3/uL (ref 0.7–4.0)
MCH: 28.7 pg (ref 26.0–34.0)
MCHC: 32.2 g/dL (ref 30.0–36.0)
MCV: 89.1 fL (ref 80.0–100.0)
Monocytes Absolute: 0.5 10*3/uL (ref 0.1–1.0)
Monocytes Relative: 6 %
Neutro Abs: 3.9 10*3/uL (ref 1.7–7.7)
Neutrophils Relative %: 47 %
Platelet Count: 263 10*3/uL (ref 150–400)
RBC: 4.11 MIL/uL (ref 3.87–5.11)
RDW: 14.9 % (ref 11.5–15.5)
WBC Count: 8.3 10*3/uL (ref 4.0–10.5)
nRBC: 0 % (ref 0.0–0.2)

## 2020-11-27 LAB — MAGNESIUM: Magnesium: 2 mg/dL (ref 1.7–2.4)

## 2020-11-27 MED ORDER — PALBOCICLIB 100 MG PO CAPS
100.0000 mg | ORAL_CAPSULE | Freq: Every day | ORAL | 3 refills | Status: DC
  Filled 2020-11-27: qty 21, 21d supply, fill #0

## 2020-11-27 NOTE — Telephone Encounter (Signed)
Oral Oncology Patient Advocate Encounter  Prior Authorization for Audrey [name] has been approved.    PA# [id number] Effective dates: 11/27/20 through 05/26/21  Patients co-pay is $3005.92.    Oral Oncology Clinic will continue to follow.   [place] Patient [place] Phone [phone] Fax [phone] 11/27/2020 2:32 PM

## 2020-11-27 NOTE — Telephone Encounter (Signed)
Oral Oncology Pharmacist Encounter  Received new prescription for palbociclib [facility]) for the treatment of hormone receptor positive, HER2 negative breast cancer in conjunction with anastrozole, planned duration until disease progression or unacceptable toxicity. Medication dose and frequency assessed, no interventions needed. Medication dose decreased by MD for tolerability.   Labs from 11/27/20 assessed, no interventions needed. Creatinine is slightly elevated at 1.15 although there are no dose modifications that are needed.  Current medication list in Epic reviewed, DDIs with Ibrance identified: Patient is on a PPI (lansoprazole) which can decrease the effects of Ibrance although this is seen to a larger extent with the Ibrance capsules compared to the tablets.   Evaluated chart and no patient barriers to medication adherence noted.   Patient agreement for treatment documented in MD note on 11/27/20.  Prescription has been e-scribed to the [facility] for benefits analysis and approval.  Oral Oncology Clinic will continue to follow for insurance authorization, copayment issues, initial counseling and start date.  Drema Halon, PharmD Hematology/Oncology Clinical Pharmacist [facility] [phone] 11/27/2020 1:50 PM

## 2020-11-27 NOTE — Assessment & Plan Note (Signed)
H/Oright breast cancer on 2017and 1999: Lumpectomy.(Bilateral mastectomies in 2017: Seen by Dr. Lavera Guise, could not tolerate antiestrogen therapy) Chest CT on 06/06/19 showed a mass in the right axilla and a mass in the right lower lung lobe. PET scan on 07/03/19 showed a right lower lobe pulmonary nodule concerning for malignancy, the right axillary mass consistent with local breast cancer recurrence, and small hypermetabolic right axillary and neck nodes consistent with metastatic adenopathy. Biopsy of the right axilla on 07/13/19 showed carcinoma consistent with breast carcinoma, HER-2 negative (1+), ER+ 95%, PR+ 75%, Ki67 25%. ---------------------------------------------------------------------------------------------------------------------------------------------- Current treatment: Anastrozole 1 mg daily started 08/14/2019  Severe pain: Marked improvement  Because of the lung nodule highly suspicious for metastatic cancer and difficulty with performing the axillary surgery, we decided to treat her with palliative intent antiestrogen therapy.  She is movedin with her daughter. CT Chest 02/20/20:RLL lung mass is similar in size (3.4 cm), Multiple non specific new lung nodules 5 mm or less, T2 sclerotic lesion same CT chest abdomen and pelvis 06/21/2020: Persistent right axillary lymphadenopathy stable. Spiculated right lower lobe lung mass, slow growth. Minimal interval enlargement of small right lower lobe lung nodule CT chest 10/21/2020: Interval enlargement of the spiculated subpleural mass from 3.1 cm to 3.6 cm (?  Lung primary), bulky right axillary lymph nodes measuring 4 cm (previously 2.5 cm)  Radiology review: There is clear-cut progression of disease both in the lung mass as well as the axillary lymph nodes.  Lung nodule: Being evaluated by pulmonary  Current treatment: Verzenio with anastrozole Verzenio toxicities: 1.  Severe diarrhea: Loperamide caused her  constipation, history of diverticulitis Recommendation reducing the dosage to 50 twice a day   PET CT scan 11/07/2020: Progression of right axillary nodal metastases.  New hypermetabolic mediastinal and right hilar metastases compared to before.  Interval increase in the size of the hypermetabolic right lower lobe nodular mass

## 2020-11-27 NOTE — Telephone Encounter (Signed)
Oral Oncology Patient Advocate Encounter   Received notification from [facility] that prior authorization for Audrey [name] is required.   PA submitted on CoverMyMeds Key [id number]  Status is pending   Oral Oncology Clinic will continue to follow.  [place] Patient [place] Phone [phone] Fax [phone] 11/27/2020 2:02 PM

## 2020-12-01 ENCOUNTER — Other Ambulatory Visit (HOSPITAL_COMMUNITY): Payer: Self-pay

## 2020-12-01 NOTE — Telephone Encounter (Signed)
Placed MD portion in mailbox.

## 2020-12-01 NOTE — Telephone Encounter (Signed)
Pharmacist spoke to patient, she will come by office on 11/22 to complete Ibrance PAP application.  Printed and filled out, placed on [employer].

## 2020-12-01 NOTE — Telephone Encounter (Signed)
Will route to providers at [facility].

## 2020-12-01 NOTE — Telephone Encounter (Signed)
Call made to patient, confirms DOB. Appt made.   Nothing further needed at this time.

## 2020-12-02 NOTE — Telephone Encounter (Signed)
Oral Chemotherapy Pharmacist Encounter  Left voicemail for Mrs. Barrett to discuss new medication on 12/05/20.   Drema Halon, PharmD Hematology/Oncology Clinical Pharmacist [facility] [phone] 12/02/2020   12:33 PM

## 2020-12-05 NOTE — Telephone Encounter (Addendum)
Oral Chemotherapy Pharmacist Encounter  I spoke with patient for overview of: Ibrance for the treatment of metastatic, hormone-receptor positive, HER2 receptor negative breast cancer, in combination with anastrozole, planned duration until disease progression or unacceptable toxicity.   Counseled patient on administration, dosing, side effects, monitoring, drug-food interactions, safe handling, storage, and disposal.  Patient will take Ibrance 112m tablets, 1 tablet by mouth once daily, with or without food, taken for 3 weeks on, 1 week off, and repeated.   Patient currently has Ibrance 1069mcapsules and will take with food. Next cycle will be the tablets. Patient also states that she does not take the Prevacid very often and agrees to not take while on this medication.  Patient knows to avoid grapefruit and grapefruit juice while on treatment with Ibrance.  Ibrance start date: 12/08/20  Adverse effects include but are not limited to: fatigue, hair loss, GI upset, nausea, decreased blood counts, and increased upper respiratory infections. Severe, life-threatening, and/or fatal interstitial lung disease (ILD) and/or pneumonitis may occur with CDK 4/6 inhibitors.  Patient will obtain anti diarrheal and alert the office of 4 or more loose stools above baseline.  Reviewed with patient importance of keeping a medication schedule and plan for any missed doses. No barriers to medication adherence identified.  Medication reconciliation performed and medication/allergy list updated.  Insurance authorization for IbLeslee Homeas been obtained. Met with patient in the clinic and was able to fill out patient assistance program paperwork.   Patient informed the pharmacy will reach out 5-7 days prior to needing next fill of Ibrance to coordinate continued medication acquisition to prevent break in therapy.  All questions answered.  Mrs. Fritchman voiced understanding and appreciation.   Medication  education handout placed in mail for patient. Patient knows to call the office with questions or concerns. Oral Chemotherapy Clinic phone number provided to patient.   KaDrema HalonPharmD Hematology/Oncology Clinical Pharmacist [phone]

## 2020-12-10 ENCOUNTER — Other Ambulatory Visit: Payer: Self-pay

## 2020-12-10 DIAGNOSIS — C50411 Malignant neoplasm of upper-outer quadrant of right female breast: Secondary | ICD-10-CM

## 2020-12-10 DIAGNOSIS — Z170 Estrogen receptor positive status [ER+]: Secondary | ICD-10-CM

## 2020-12-11 ENCOUNTER — Other Ambulatory Visit: Payer: Self-pay

## 2020-12-11 ENCOUNTER — Inpatient Hospital Stay: Payer: Medicare HMO | Attending: Hematology and Oncology

## 2020-12-11 ENCOUNTER — Inpatient Hospital Stay: Payer: Medicare HMO | Admitting: Pharmacist

## 2020-12-11 VITALS — BP 151/61 | HR 82 | Temp 98.7°F | Resp 18 | Wt 138.1 lb

## 2020-12-11 DIAGNOSIS — C50411 Malignant neoplasm of upper-outer quadrant of right female breast: Secondary | ICD-10-CM

## 2020-12-11 DIAGNOSIS — Z880 Allergy status to penicillin: Secondary | ICD-10-CM | POA: Diagnosis not present

## 2020-12-11 DIAGNOSIS — Z885 Allergy status to narcotic agent status: Secondary | ICD-10-CM | POA: Diagnosis not present

## 2020-12-11 DIAGNOSIS — Z170 Estrogen receptor positive status [ER+]: Secondary | ICD-10-CM | POA: Diagnosis not present

## 2020-12-11 DIAGNOSIS — C773 Secondary and unspecified malignant neoplasm of axilla and upper limb lymph nodes: Secondary | ICD-10-CM | POA: Diagnosis not present

## 2020-12-11 DIAGNOSIS — Z79899 Other long term (current) drug therapy: Secondary | ICD-10-CM | POA: Diagnosis not present

## 2020-12-11 DIAGNOSIS — Z79811 Long term (current) use of aromatase inhibitors: Secondary | ICD-10-CM | POA: Insufficient documentation

## 2020-12-11 DIAGNOSIS — R590 Localized enlarged lymph nodes: Secondary | ICD-10-CM | POA: Insufficient documentation

## 2020-12-11 DIAGNOSIS — Z9013 Acquired absence of bilateral breasts and nipples: Secondary | ICD-10-CM | POA: Insufficient documentation

## 2020-12-11 DIAGNOSIS — R918 Other nonspecific abnormal finding of lung field: Secondary | ICD-10-CM | POA: Diagnosis not present

## 2020-12-11 DIAGNOSIS — Z886 Allergy status to analgesic agent status: Secondary | ICD-10-CM | POA: Insufficient documentation

## 2020-12-11 LAB — CBC WITH DIFFERENTIAL (CANCER CENTER ONLY)
Abs Immature Granulocytes: 0.03 10*3/uL (ref 0.00–0.07)
Basophils Absolute: 0.1 10*3/uL (ref 0.0–0.1)
Basophils Relative: 1 %
Eosinophils Absolute: 0.1 10*3/uL (ref 0.0–0.5)
Eosinophils Relative: 1 %
HCT: 36.9 % (ref 36.0–46.0)
Hemoglobin: 12.7 g/dL (ref 12.0–15.0)
Immature Granulocytes: 0 %
Lymphocytes Relative: 40 %
Lymphs Abs: 3.8 10*3/uL (ref 0.7–4.0)
MCH: 29.7 pg (ref 26.0–34.0)
MCHC: 34.4 g/dL (ref 30.0–36.0)
MCV: 86.2 fL (ref 80.0–100.0)
Monocytes Absolute: 0.5 10*3/uL (ref 0.1–1.0)
Monocytes Relative: 5 %
Neutro Abs: 5 10*3/uL (ref 1.7–7.7)
Neutrophils Relative %: 53 %
Platelet Count: 376 10*3/uL (ref 150–400)
RBC: 4.28 MIL/uL (ref 3.87–5.11)
RDW: 15 % (ref 11.5–15.5)
WBC Count: 9.5 10*3/uL (ref 4.0–10.5)
nRBC: 0 % (ref 0.0–0.2)

## 2020-12-11 LAB — CMP (CANCER CENTER ONLY)
ALT: 13 U/L (ref 0–44)
AST: 12 U/L — ABNORMAL LOW (ref 15–41)
Albumin: 4.2 g/dL (ref 3.5–5.0)
Alkaline Phosphatase: 105 U/L (ref 38–126)
Anion gap: 11 (ref 5–15)
BUN: 24 mg/dL — ABNORMAL HIGH (ref 8–23)
CO2: 23 mmol/L (ref 22–32)
Calcium: 9.2 mg/dL (ref 8.9–10.3)
Chloride: 105 mmol/L (ref 98–111)
Creatinine: 1.37 mg/dL — ABNORMAL HIGH (ref 0.44–1.00)
GFR, Estimated: 40 mL/min — ABNORMAL LOW (ref >60)
Glucose, Bld: 91 mg/dL (ref 70–99)
Potassium: 4.4 mmol/L (ref 3.5–5.1)
Sodium: 139 mmol/L (ref 135–145)
Total Bilirubin: 0.3 mg/dL (ref 0.3–1.2)
Total Protein: 7.4 g/dL (ref 6.5–8.1)

## 2020-12-11 LAB — MAGNESIUM: Magnesium: 2 mg/dL (ref 1.7–2.4)

## 2020-12-11 NOTE — Progress Notes (Signed)
[place]       Telephone: [phone]?Fax: [phone]   Oncology Clinical Pharmacist Practitioner Initial Assessment  Audrey [name] is a 80 y.o. female with a diagnosis of metastatic breast cancer.  Indication/Regimen Palbociclib Audrey [name]) is being used appropriately for treatment of metastatic breast cancer by Dr. Nicholas Lose.    Wt Readings from Last 1 Encounters:  12/11/20 138 lb 1.6 oz (62.6 kg)    Estimated body surface area is 1.65 meters squared as calculated from the following:   Height as of 11/27/20: 5\' 2"  (1.575 m).   Weight as of this encounter: 138 lb 1.6 oz (62.6 kg).  The dosing regimen is 100 mg by mouth daily on days 1 to 21 of a 28-day cycle. It is planned to continue until disease progression or unacceptable toxicity. Clinical pharmacy Audrey [name] on 11/13/20 when she was on abemaciclib but due to her diverticulitis and diarrhea, she was switched to palbociclib on 12/08/20 by Dr. Lindi Adie.  She is not experiencing any side effects from palbociclib at this time.  She will receive labs every 2 weeks for 2 months, the monthly for 2 months, then as clinically indicated.  She is also on anastrozole daily.   Audrey [name] states her free drug application has been approved and they should be contacting her for shipment soon.  We will remove the magnesium lab orders as this was mainly when she was having diarrhea which has subsided since now on palbociclib.  We reviewed the possible adverse effects of palbociclib which include but are not limited to myelosuppression, especially neutropenia and with that risk of infection, fatigue, nausea, diarrhea, decreased appetite as well as alopecia and thrombocytopenia. She knows to continue to avoid grapefruit produccts.  Dose Modifications Reduced dose to 100 mg per Dr. Lindi Adie.  May increase to full dose, 125 mg at some point in the future   Access Assessment Audrey [name] will be receiving palbociclib through   manufacturer assistance Insurance Concerns: none Start date if known: 12/08/20  Allergies Allergies  Allergen Reactions   Ativan [Lorazepam] Itching   Aspirin Other (See Comments)    Stomach pain   Codeine Nausea And Vomiting   Penicillins Rash    Vitals Vitals with BMI 12/11/2020 11/27/2020 11/13/2020  Height - 5\' 2"  5\' 2"   Weight 138 lbs 2 oz 136 lbs 11 oz 135 lbs 10 oz  BMI 31.54 25 00.8  Systolic 676 195 093  Diastolic 61 62 51  Pulse 82 90 90     Laboratory Data CBC EXTENDED Latest Ref Rng & Units 12/11/2020 11/27/2020 11/13/2020  WBC 4.0 - 10.5 K/uL 9.5 8.3 8.3  RBC 3.87 - 5.11 MIL/uL 4.28 4.11 4.17  HGB 12.0 - 15.0 g/dL 12.7 11.8(L) 12.1  HCT 36.0 - 46.0 % 36.9 36.6 36.2  PLT 150 - 400 K/uL 376 263 282  NEUTROABS 1.7 - 7.7 K/uL 5.0 3.9 4.9  LYMPHSABS 0.7 - 4.0 K/uL 3.8 3.8 3.0    CMP Latest Ref Rng & Units 12/11/2020 11/27/2020 11/13/2020  Glucose 70 - 99 mg/dL 91 106(H) 89  BUN 8 - 23 mg/dL 24(H) 17 19  Creatinine 0.44 - 1.00 mg/dL 1.37(H) 1.15(H) 1.27(H)  Sodium 135 - 145 mmol/L 139 141 139  Potassium 3.5 - 5.1 mmol/L 4.4 4.4 4.3  Chloride 98 - 111 mmol/L 105 106 106  CO2 22 - 32 mmol/L 23 26 23   Calcium 8.9 - 10.3 mg/dL 9.2 9.3 9.7  Total Protein 6.5 -  8.1 g/dL 7.4 7.1 6.9  Total Bilirubin 0.3 - 1.2 mg/dL 0.3 <0.2(L) 0.4  Alkaline Phos 38 - 126 U/L 105 104 91  AST 15 - 41 U/L 12(L) 14(L) 13(L)  ALT 0 - 44 U/L 13 13 8    Lab Results  Component Value Date   MG 2.0 12/11/2020   MG 2.0 11/27/2020     Contraindications Contraindications were reviewed? Yes Contraindications to therapy were identified? No   Safety Precautions The following safety precautions for the use of palbociclib were reviewed:  Fever: reviewed the importance of having a thermometer and the Centers for Disease Control and Prevention (CDC) definition of fever which is 100.31F (38C) or higher. Patient should call 24/7 triage at [phone] if experiencing a fever or any other  symptoms Myelosuppression ILD / Pneumonitis Fatigue Alopecia N/V/D  Medication Reconciliation Current Outpatient Medications  Medication Sig Dispense Refill   ALPRAZolam (XANAX) 0.25 MG tablet Take 0.25 mg by mouth at bedtime as needed for sleep.     amLODipine (NORVASC) 2.5 MG tablet Take 2.5 mg by mouth daily.     anastrozole (ARIMIDEX) 1 MG tablet Take 1 tablet (1 mg total) by mouth daily. 90 tablet 3   Cholecalciferol (VITAMIN D3) 1.25 MG (50000 UT) CAPS Take by mouth.     co-enzyme Q-10 30 MG capsule Take 30 mg by mouth daily.     dicyclomine (BENTYL) 20 MG tablet Take 20 mg by mouth every 6 (six) hours. 1/2 tablet twice daily as needed     hyoscyamine (LEVBID) 0.375 MG 12 hr tablet Take 0.375 mg by mouth 2 (two) times daily.     ketotifen (ZADITOR) 0.025 % ophthalmic solution Place 1 drop into both eyes 2 (two) times daily as needed (dry eyes).     omeprazole (PRILOSEC) 20 MG capsule Take 20 mg by mouth daily as needed.     palbociclib (IBRANCE) 100 MG capsule Take 1 capsule (100 mg total) by mouth daily with breakfast. Take whole with food. Take for 21 days on, 7 days off, repeat every 28 days. 21 capsule 3   polyethylene glycol (MIRALAX / GLYCOLAX) 17 g packet Take 17 g by mouth daily. Every other day as needed     No current facility-administered medications for this visit.    Medication reconciliation is based on the patient's most recent medication list in the electronic medical record (EMR) including herbal products and OTC medications.   The patient's medication list was reviewed today with the patient? Yes   Drug-drug interactions (DDIs) DDIs were evaluated? Yes DDIs identified? No   Drug-Food Interactions Drug-food interactions were evaluated? Yes Drug-food interactions identified?  Avoid grapefruit products  Follow-up Plan  Removing magnesium level standing orders Labs / Dr. Lindi Adie visit on 12/22/20 and 02/05/21 (review scans as well) Labs / Pharmacy clinic  visit on 01/08/21 and 01/22/21  Audrey [name] participated in the discussion, expressed understanding, and voiced agreement with the above plan. All questions were answered to her satisfaction. The patient was advised to contact the clinic at [phone] with any questions or concerns prior to her return visit.   I spent 30 minutes assessing the patient.  Raina Mina, RPH-CPP, 12/11/2020 12:20 PM

## 2020-12-17 ENCOUNTER — Telehealth: Payer: Self-pay

## 2020-12-17 DIAGNOSIS — Z170 Estrogen receptor positive status [ER+]: Secondary | ICD-10-CM

## 2020-12-17 DIAGNOSIS — C50411 Malignant neoplasm of upper-outer quadrant of right female breast: Secondary | ICD-10-CM

## 2020-12-17 MED ORDER — PALBOCICLIB 100 MG PO CAPS: 100.0000 mg | ORAL_CAPSULE | Freq: Every day | ORAL | 3 refills | Status: DC

## 2020-12-17 NOTE — Telephone Encounter (Signed)
Oral Oncology Pharmacist Encounter  Prescription refill for Ibrance sent to [facility] in error. Patient enrolled in manufacturer assistance and receives medication through [employer]. Prescription redirected to [facility] pharmacy.  Drema Halon, PharmD Hematology/Oncology Clinical Pharmacist [facility] [phone] 12/17/2020 1:59 PM

## 2020-12-17 NOTE — Telephone Encounter (Signed)
Patient is approved for Ibrance at no cost from [employer] 12/10/20-01/10/21.  [place] Patient [place] Phone [phone] Fax [phone] 12/17/2020 11:01 AM

## 2020-12-18 ENCOUNTER — Encounter: Payer: Self-pay | Admitting: Emergency Medicine

## 2020-12-18 ENCOUNTER — Ambulatory Visit: Payer: Medicare HMO | Admitting: Emergency Medicine

## 2020-12-18 ENCOUNTER — Other Ambulatory Visit: Payer: Self-pay

## 2020-12-18 VITALS — BP 118/68 | HR 86 | Temp 98.5°F | Ht 64.0 in | Wt 140.6 lb

## 2020-12-18 DIAGNOSIS — R918 Other nonspecific abnormal finding of lung field: Secondary | ICD-10-CM | POA: Diagnosis not present

## 2020-12-18 DIAGNOSIS — R9389 Abnormal findings on diagnostic imaging of other specified body structures: Secondary | ICD-10-CM | POA: Diagnosis not present

## 2020-12-18 NOTE — Assessment & Plan Note (Signed)
Reviewed CT chest and PET scan with her today.  Also discussed the rationale for working on a tissue diagnosis to evaluate her right upper lobe mass, mediastinal adenopathy.  In particular discussed the need to differentiate between possible lung cancer and her known breast cancer.  I recommended navigational bronchoscopy with EBUS to obtain biopsies.  She agrees, would like to try to schedule this for 12/19.  I will start working on getting this arranged.  We will work on arranging bronchoscopy to evaluate your right upper lobe nodule and enlarged right-sided lymph nodes.  We will try to get this done on 12/29/2020.  This will be done as an outpatient at [facility] endoscopy under general anesthesia.  You will need a designated driver. Follow with Dr Lamonte Sakai in 1 month

## 2020-12-18 NOTE — Patient Instructions (Signed)
We will work on arranging bronchoscopy to evaluate your right upper lobe nodule and enlarged right-sided lymph nodes.  We will try to get this done on 12/29/2020.  This will be done as an outpatient at [facility] endoscopy under general anesthesia.  You will need a designated driver. Follow with Dr Lamonte Sakai in 1 month

## 2020-12-18 NOTE — Progress Notes (Signed)
Calm  Subjective:    Patient ID: Audrey [name], female    DOB: 03-24-1940, 80 y.o.   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  Followed by Dr. Lindi Adie.  Also with pre-diabetes, hypercholesterolemia, GERD with a hiatal hernia, hypertension.  She has been seen here by Dr. Ander Slade. Here to discuss pulmonary nodules and recent PET  Surveillance PET scan done on 11/05/2020 reviewed by me, showed progression of right axillary hypermetabolic nodal metastases, new hypermetabolic mediastinal and right hilar nodal metastases and an interval increase in size of a right lower lobe hypermetabolic nodular mass.   Review of Systems As per HPI  Past Medical History:  Diagnosis Date   Arthritis    "hands" (02/25/2014)   Borderline type 2 diabetes mellitus    Breast cancer, right breast ([place]) 1999   recurrence 08/2017   GERD (gastroesophageal reflux disease)    High cholesterol    "can't take the RX; makes my bones ache" (02/25/2014)   History of hiatal hernia    History of stomach ulcers    Hypertension    Migraines    hx   PONV (postoperative nausea and vomiting)    Shortness of breath dyspnea    w/ exertion      No family history on file.   Social History   Socioeconomic History   Marital status: Widowed    Spouse name: Not on file   Number of children: Not on file   Years of education: Not on file   Highest education level: Not on file  Occupational History   Not on file  Tobacco Use   Smoking status: Some Days    Packs/day: 0.75    Years: 10.00    Pack years: 7.50    Types: Cigarettes    Start date: 11/05/2002    Last attempt to quit: 02/12/2011    Years since quitting: 9.8   Smokeless tobacco: Never   Tobacco comments:    Smokes 7 - 10 cigarettes a day ARJ 12/18/20  Substance and Sexual Activity   Alcohol use: No   Drug use: No   Sexual activity: Never  Other Topics  Concern   Not on file  Social History Narrative   Not on file   Social Determinants of Health   Financial Resource Strain: Not on file  Food Insecurity: Not on file  Transportation Needs: Not on file  Physical Activity: Not on file  Stress: Not on file  Social Connections: Not on file  Intimate Partner Violence: Not on file     Allergies  Allergen Reactions   Ativan [Lorazepam] Itching   Aspirin Other (See Comments)    Stomach pain   Codeine Nausea And Vomiting   Penicillins Rash     Outpatient Medications Prior to Visit  Medication Sig Dispense Refill   ALPRAZolam (XANAX) 0.25 MG tablet Take 0.25 mg by mouth at bedtime as needed for sleep.     amLODipine (NORVASC) 2.5 MG tablet Take 2.5 mg by mouth daily.     anastrozole (ARIMIDEX) 1 MG tablet Take 1 tablet (1 mg total) by mouth daily. 90 tablet 3   Cholecalciferol (VITAMIN D3) 1.25 MG (50000 UT) CAPS Take by mouth.     co-enzyme Q-10 30 MG capsule Take 30 mg by mouth daily.     dicyclomine (BENTYL) 20 MG tablet Take 20 mg by mouth every 6 (six) hours. 1/2 tablet  twice daily as needed     hyoscyamine (LEVBID) 0.375 MG 12 hr tablet Take 0.375 mg by mouth 2 (two) times daily.     ketotifen (ZADITOR) 0.025 % ophthalmic solution Place 1 drop into both eyes 2 (two) times daily as needed (dry eyes).     omeprazole (PRILOSEC) 20 MG capsule Take 20 mg by mouth daily as needed. (Patient not taking: Reported on 12/18/2020)     palbociclib (IBRANCE) 100 MG capsule Take 1 capsule (100 mg total) by mouth daily with breakfast. Take whole with food. Take for 21 days on, 7 days off, repeat every 28 days. 21 capsule 3   polyethylene glycol (MIRALAX / GLYCOLAX) 17 g packet Take 17 g by mouth daily. Every other day as needed     No facility-administered medications prior to visit.         Objective:   Physical Exam Vitals:   12/18/20 1419  BP: 118/68  Pulse: 86  Temp: 98.5 F (36.9 C)  TempSrc: Oral  SpO2: 100%  Weight: 140 lb 9.6  oz (63.8 kg)  Height: 5\' 4"  (1.626 m)   Gen: Pleasant, overwt woman, in no distress,  normal affect  ENT: No lesions,  mouth clear,  oropharynx clear, no postnasal drip  Neck: No JVD, no stridor  Lungs: No use of accessory muscles, no crackles or wheezing on normal respiration, no wheeze on forced expiration  Cardiovascular: RRR, heart sounds normal, no murmur or gallops, no peripheral edema  Musculoskeletal: No deformities, no cyanosis or clubbing  Neuro: alert, awake, non focal  Skin: Warm, no lesions or rash     Assessment & Plan:  Abnormal CT of the chest Reviewed CT chest and PET scan with her today.  Also discussed the rationale for working on a tissue diagnosis to evaluate her right upper lobe mass, mediastinal adenopathy.  In particular discussed the need to differentiate between possible lung cancer and her known breast cancer.  I recommended navigational bronchoscopy with EBUS to obtain biopsies.  She agrees, would like to try to schedule this for 12/19.  I will start working on getting this arranged.  We will work on arranging bronchoscopy to evaluate your right upper lobe nodule and enlarged right-sided lymph nodes.  We will try to get this done on 12/29/2020.  This will be done as an outpatient at [facility] endoscopy under general anesthesia.  You will need a designated driver. Follow with Dr Lamonte Sakai in 1 month   Baltazar Apo, MD, PhD 12/18/2020, 5:12 PM [facility] [phone] or if no answer before 7:00PM call [phone] For any issues after 7:00PM please call eLink [phone]

## 2020-12-18 NOTE — H&P (View-Only) (Signed)
Calm  Subjective:    Patient ID: Audrey [name], female    DOB: 1940/09/14, 80 y.o.   MRN: [id number]  HPI 80 year old woman smoker (10 pack years) with a history of right-sided breast cancer (2019) with axillary nodal metastasis.  Followed by Dr. Lindi Adie.  Also with pre-diabetes, hypercholesterolemia, GERD with a hiatal hernia, hypertension.  She has been seen here by Dr. Ander Slade. Here to discuss pulmonary nodules and recent PET  Surveillance PET scan done on 11/05/2020 reviewed by me, showed progression of right axillary hypermetabolic nodal metastases, new hypermetabolic mediastinal and right hilar nodal metastases and an interval increase in size of a right lower lobe hypermetabolic nodular mass.   Review of Systems As per HPI  Past Medical History:  Diagnosis Date   Arthritis    "hands" (02/25/2014)   Borderline type 2 diabetes mellitus    Breast cancer, right breast ([place]) 1999   recurrence 08/2017   GERD (gastroesophageal reflux disease)    High cholesterol    "can't take the RX; makes my bones ache" (02/25/2014)   History of hiatal hernia    History of stomach ulcers    Hypertension    Migraines    hx   PONV (postoperative nausea and vomiting)    Shortness of breath dyspnea    w/ exertion      No family history on file.   Social History   Socioeconomic History   Marital status: Widowed    Spouse name: Not on file   Number of children: Not on file   Years of education: Not on file   Highest education level: Not on file  Occupational History   Not on file  Tobacco Use   Smoking status: Some Days    Packs/day: 0.75    Years: 10.00    Pack years: 7.50    Types: Cigarettes    Start date: 11/05/2002    Last attempt to quit: 02/12/2011    Years since quitting: 9.8   Smokeless tobacco: Never   Tobacco comments:    Smokes 7 - 10 cigarettes a day ARJ 12/18/20  Substance and Sexual Activity   Alcohol use: No   Drug use: No   Sexual activity: Never  Other Topics  Concern   Not on file  Social History Narrative   Not on file   Social Determinants of Health   Financial Resource Strain: Not on file  Food Insecurity: Not on file  Transportation Needs: Not on file  Physical Activity: Not on file  Stress: Not on file  Social Connections: Not on file  Intimate Partner Violence: Not on file     Allergies  Allergen Reactions   Ativan [Lorazepam] Itching   Aspirin Other (See Comments)    Stomach pain   Codeine Nausea And Vomiting   Penicillins Rash     Outpatient Medications Prior to Visit  Medication Sig Dispense Refill   ALPRAZolam (XANAX) 0.25 MG tablet Take 0.25 mg by mouth at bedtime as needed for sleep.     amLODipine (NORVASC) 2.5 MG tablet Take 2.5 mg by mouth daily.     anastrozole (ARIMIDEX) 1 MG tablet Take 1 tablet (1 mg total) by mouth daily. 90 tablet 3   Cholecalciferol (VITAMIN D3) 1.25 MG (50000 UT) CAPS Take by mouth.     co-enzyme Q-10 30 MG capsule Take 30 mg by mouth daily.     dicyclomine (BENTYL) 20 MG tablet Take 20 mg by mouth every 6 (six) hours. 1/2 tablet  twice daily as needed     hyoscyamine (LEVBID) 0.375 MG 12 hr tablet Take 0.375 mg by mouth 2 (two) times daily.     ketotifen (ZADITOR) 0.025 % ophthalmic solution Place 1 drop into both eyes 2 (two) times daily as needed (dry eyes).     omeprazole (PRILOSEC) 20 MG capsule Take 20 mg by mouth daily as needed. (Patient not taking: Reported on 12/18/2020)     palbociclib (IBRANCE) 100 MG capsule Take 1 capsule (100 mg total) by mouth daily with breakfast. Take whole with food. Take for 21 days on, 7 days off, repeat every 28 days. 21 capsule 3   polyethylene glycol (MIRALAX / GLYCOLAX) 17 g packet Take 17 g by mouth daily. Every other day as needed     No facility-administered medications prior to visit.         Objective:   Physical Exam Vitals:   12/18/20 1419  BP: 118/68  Pulse: 86  Temp: 98.5 F (36.9 C)  TempSrc: Oral  SpO2: 100%  Weight: 140 lb 9.6  oz (63.8 kg)  Height: 5\' 4"  (1.626 m)   Gen: Pleasant, overwt woman, in no distress,  normal affect  ENT: No lesions,  mouth clear,  oropharynx clear, no postnasal drip  Neck: No JVD, no stridor  Lungs: No use of accessory muscles, no crackles or wheezing on normal respiration, no wheeze on forced expiration  Cardiovascular: RRR, heart sounds normal, no murmur or gallops, no peripheral edema  Musculoskeletal: No deformities, no cyanosis or clubbing  Neuro: alert, awake, non focal  Skin: Warm, no lesions or rash     Assessment & Plan:  Abnormal CT of the chest Reviewed CT chest and PET scan with her today.  Also discussed the rationale for working on a tissue diagnosis to evaluate her right upper lobe mass, mediastinal adenopathy.  In particular discussed the need to differentiate between possible lung cancer and her known breast cancer.  I recommended navigational bronchoscopy with EBUS to obtain biopsies.  She agrees, would like to try to schedule this for 12/19.  I will start working on getting this arranged.  We will work on arranging bronchoscopy to evaluate your right upper lobe nodule and enlarged right-sided lymph nodes.  We will try to get this done on 12/29/2020.  This will be done as an outpatient at [facility] endoscopy under general anesthesia.  You will need a designated driver. Follow with Dr Lamonte Sakai in 1 month   Baltazar Apo, MD, PhD 12/18/2020, 5:12 PM [place] [facility] [phone] or if no answer before 7:00PM call [phone] For any issues after 7:00PM please call eLink [phone]

## 2020-12-19 ENCOUNTER — Telehealth: Payer: Self-pay | Admitting: Emergency Medicine

## 2020-12-19 ENCOUNTER — Other Ambulatory Visit: Payer: Self-pay

## 2020-12-19 DIAGNOSIS — Z170 Estrogen receptor positive status [ER+]: Secondary | ICD-10-CM

## 2020-12-19 DIAGNOSIS — C50411 Malignant neoplasm of upper-outer quadrant of right female breast: Secondary | ICD-10-CM

## 2020-12-19 NOTE — Telephone Encounter (Signed)
I scheduled pt for 12/19 at 9:15 at [facility].  Pt will go for covid test on 12/16.  I spoke to [place] at [place] and she will send disc to [facility].  Spoke to pt & gave her appt info.

## 2020-12-21 NOTE — Assessment & Plan Note (Signed)
H/Oright breast cancer on 2017and 1999: Lumpectomy.(Bilateral mastectomies in 2017: Seen by Dr. Lavera Guise, could not tolerate antiestrogen therapy) Chest CT on 06/06/19 showed a mass in the right axilla and a mass in the right lower lung lobe. PET scan on 07/03/19 showed a right lower lobe pulmonary nodule concerning for malignancy, the right axillary mass consistent with local breast cancer recurrence, and small hypermetabolic right axillary and neck nodes consistent with metastatic adenopathy. Biopsy of the right axilla on 07/13/19 showed carcinoma consistent with breast carcinoma, HER-2 negative (1+), ER+ 95%, PR+ 75%, Ki67 25%. ---------------------------------------------------------------------------------------------------------------------------------------------- Current treatment: Anastrozole 1 mg daily started 08/14/2019 She is movedin with her daughter. CT Chest 02/20/20:RLL lung mass is similar in size (3.4 cm), Multiple non specific new lung nodules 5 mm or less, T2 sclerotic lesion same CT chest abdomen and pelvis 06/21/2020: Persistent right axillary lymphadenopathy stable. Spiculated right lower lobe lung mass, slow growth. Minimal interval enlargement of small right lower lobe lung nodule CT chest 10/21/2020: Interval enlargement of the spiculated subpleural mass from 3.1 cm to 3.6 cm (? Lung primary), bulky right axillary lymph nodes measuring 4 cm (previously 2.5 cm) PET CT scan 11/07/2020: Progression of right axillary nodal metastases.  New hypermetabolic mediastinal and right hilar metastases compared to before.  Interval increase in the size of the hypermetabolic right lower lobe nodular mass Radiology review:There is clear-cut progression of disease both in the lung mass as well as the axillary lymph nodes.  Lung nodule: We decided to treat her with CDK 4 and 6 inhibitors for another month and then obtain scan in January.  If the scans look like response then we will  just attribute the lung nodule in the lymphadenopathy to breast cancer.  If not then we will have her see pulmonary for biopsy.  We referred her to pulmonary but she did not see them because she had profound diarrhea.  Current treatment: Ibrance with anastrozole  Verzenio toxicities: 1.  Severe diarrhea:  history of diverticulitis Because of severe diarrhea we decided to discontinue Verzenio.  We will switch her to [place]. We will start Ibrance at 100 mg daily 21 days on 7 days off.  Return to clinic in December to follow-up on adverse effects of Ibrance.

## 2020-12-21 NOTE — Progress Notes (Signed)
Patient Care Team: [name], MD as PCP - General (Family Medicine)  DIAGNOSIS:    ICD-10-CM   1. Malignant neoplasm of upper-outer quadrant of right breast in female, estrogen receptor positive ([place])  C50.411    Z17.0       SUMMARY OF ONCOLOGIC HISTORY: Oncology History  Malignant neoplasm of upper-outer quadrant of right breast in female, estrogen receptor positive ([place])  07/13/2019 Initial Diagnosis   History of right breast cancer on 2017. Chest CT on 06/06/19 showed a mass in the right axilla and a mass in the right lower lung lobe. PET scan on 07/03/19 showed a right lower lobe pulmonary nodule concerning for malignancy, the right axillary mass consistent with local breast cancer recurrence, and small hypermetabolic right axillary and neck nodes consistent with metastatic adenopathy. Biopsy of the right axilla on 07/13/19 showed carcinoma consistent with breast carcinoma, HER-2 negative (1+), ER+ 95%, PR+ 75%, Ki67 25%.   08/14/2019 -  Anti-estrogen oral therapy   Initially anastrozole alone,   Verzenio October 2022 (Verzenio discontinued for diarrhea), switched to [facility] 12/08/2020     CHIEF COMPLIANT: Follow-up recurrent right breast cancer  INTERVAL HISTORY: Audrey [name] is a 80 y.o. with above-mentioned history of recurrent right breast cancer with right axillary masses currently on anastrozole. She reports to clinic today for follow-up.   ALLERGIES:  is allergic to ativan [lorazepam], aspirin, codeine, and penicillins.  MEDICATIONS:  Current Outpatient Medications  Medication Sig Dispense Refill   ALPRAZolam (XANAX) 0.25 MG tablet Take 0.25 mg by mouth at bedtime as needed for sleep.     amLODipine (NORVASC) 2.5 MG tablet Take 2.5 mg by mouth daily.     anastrozole (ARIMIDEX) 1 MG tablet Take 1 tablet (1 mg total) by mouth daily. 90 tablet 3   Cholecalciferol (VITAMIN D3) 1.25 MG (50000 UT) CAPS Take by mouth.     co-enzyme Q-10 30 MG capsule Take 30 mg by mouth  daily.     dicyclomine (BENTYL) 20 MG tablet Take 20 mg by mouth every 6 (six) hours. 1/2 tablet twice daily as needed     hyoscyamine (LEVBID) 0.375 MG 12 hr tablet Take 0.375 mg by mouth 2 (two) times daily.     ketotifen (ZADITOR) 0.025 % ophthalmic solution Place 1 drop into both eyes 2 (two) times daily as needed (dry eyes).     omeprazole (PRILOSEC) 20 MG capsule Take 20 mg by mouth daily as needed. (Patient not taking: Reported on 12/18/2020)     palbociclib (IBRANCE) 100 MG capsule Take 1 capsule (100 mg total) by mouth daily with breakfast. Take whole with food. Take for 21 days on, 7 days off, repeat every 28 days. 21 capsule 3   polyethylene glycol (MIRALAX / GLYCOLAX) 17 g packet Take 17 g by mouth daily. Every other day as needed     No current facility-administered medications for this visit.    PHYSICAL EXAMINATION: ECOG PERFORMANCE STATUS: 1 - Symptomatic but completely ambulatory  Vitals:   12/22/20 1230  BP: (!) 174/66  Pulse: 79  Resp: 18  Temp: 97.8 F (36.6 C)  SpO2: 100%   Filed Weights   12/22/20 1230  Weight: 139 lb 4.8 oz (63.2 kg)    BREAST: No palpable masses or nodules in either right or left breasts. No palpable axillary supraclavicular or infraclavicular adenopathy no breast tenderness or nipple discharge. (exam performed in the presence of a chaperone)  LABORATORY DATA:  I have reviewed the data as listed  CMP Latest Ref Rng & Units 12/22/2020 12/11/2020 11/27/2020  Glucose 70 - 99 mg/dL 91 91 106(H)  BUN 8 - 23 mg/dL 20 24(H) 17  Creatinine 0.44 - 1.00 mg/dL 1.33(H) 1.37(H) 1.15(H)  Sodium 135 - 145 mmol/L 139 139 141  Potassium 3.5 - 5.1 mmol/L 4.3 4.4 4.4  Chloride 98 - 111 mmol/L 106 105 106  CO2 22 - 32 mmol/L 24 23 26   Calcium 8.9 - 10.3 mg/dL 9.4 9.2 9.3  Total Protein 6.5 - 8.1 g/dL 6.9 7.4 7.1  Total Bilirubin 0.3 - 1.2 mg/dL 0.5 0.3 <0.2(L)  Alkaline Phos 38 - 126 U/L 75 105 104  AST 15 - 41 U/L 14(L) 12(L) 14(L)  ALT 0 - 44 U/L 12 13  13     Lab Results  Component Value Date   WBC 5.4 12/22/2020   HGB 11.2 (L) 12/22/2020   HCT 33.9 (L) 12/22/2020   MCV 89.0 12/22/2020   PLT 216 12/22/2020   NEUTROABS 2.2 12/22/2020    ASSESSMENT & PLAN:  Malignant neoplasm of upper-outer quadrant of right breast in female, estrogen receptor positive ([place]) H/O right breast cancer on 2017 and 1999: Lumpectomy.  (Bilateral mastectomies in 2017: Seen by Dr. Lavera Guise, could not tolerate antiestrogen therapy) Chest CT on 06/06/19 showed a mass in the right axilla and a mass in the right lower lung lobe. PET scan on 07/03/19 showed a right lower lobe pulmonary nodule concerning for malignancy, the right axillary mass consistent with local breast cancer recurrence, and small hypermetabolic right axillary and neck nodes consistent with metastatic adenopathy. Biopsy of the right axilla on 07/13/19 showed carcinoma consistent with breast carcinoma, HER-2 negative (1+), ER+ 95%, PR+ 75%, Ki67 25%. ---------------------------------------------------------------------------------------------------------------------------------------------- Current treatment: Anastrozole 1 mg daily started 08/14/2019 She is moved in with her daughter. CT Chest 02/20/20: RLL lung mass is similar in size (3.4 cm), Multiple non specific new lung nodules 5 mm or less, T2 sclerotic lesion same CT chest abdomen and pelvis 06/21/2020: Persistent right axillary lymphadenopathy stable.  Spiculated right lower lobe lung mass, slow growth.  Minimal interval enlargement of small right lower lobe lung nodule CT chest 10/21/2020: Interval enlargement of the spiculated subpleural mass from 3.1 cm to 3.6 cm (?  Lung primary), bulky right axillary lymph nodes measuring 4 cm (previously 2.5 cm) PET CT scan 11/07/2020: Progression of right axillary nodal metastases.  New hypermetabolic mediastinal and right hilar metastases compared to before.  Interval increase in the size of the  hypermetabolic right lower lobe nodular mass  Radiology review: There is clear-cut progression of disease both in the lung mass as well as the axillary lymph nodes.   Lung nodule: Patient is seeing Dr. Kyung Rudd.   Current treatment: [facility] Home with anastrozole (switched from [facility] because of diarrhea) Ibrance toxicities: Labs have been reviewed. Diarrhea has resolved.   Return to clinic in 3 months for follow-up   No orders of the defined types were placed in this encounter.  The patient has a good understanding of the overall plan. she agrees with it. she will call with any problems that may develop before the next visit here.  Total time spent: 30 mins including face to face time and time spent for planning, charting and coordination of care  Rulon Eisenmenger, MD, MPH 12/22/2020  I, Thana Ates, am acting as scribe for Dr. Nicholas Lose.  I have reviewed the above documentation for accuracy and completeness, and I agree with the above.

## 2020-12-22 ENCOUNTER — Other Ambulatory Visit: Payer: Self-pay

## 2020-12-22 ENCOUNTER — Inpatient Hospital Stay: Payer: Medicare HMO

## 2020-12-22 ENCOUNTER — Inpatient Hospital Stay: Payer: Medicare HMO | Admitting: Hematology and Oncology

## 2020-12-22 DIAGNOSIS — C50411 Malignant neoplasm of upper-outer quadrant of right female breast: Secondary | ICD-10-CM

## 2020-12-22 DIAGNOSIS — Z170 Estrogen receptor positive status [ER+]: Secondary | ICD-10-CM | POA: Diagnosis not present

## 2020-12-22 LAB — CMP (CANCER CENTER ONLY)
ALT: 12 U/L (ref 0–44)
AST: 14 U/L — ABNORMAL LOW (ref 15–41)
Albumin: 4.1 g/dL (ref 3.5–5.0)
Alkaline Phosphatase: 75 U/L (ref 38–126)
Anion gap: 9 (ref 5–15)
BUN: 20 mg/dL (ref 8–23)
CO2: 24 mmol/L (ref 22–32)
Calcium: 9.4 mg/dL (ref 8.9–10.3)
Chloride: 106 mmol/L (ref 98–111)
Creatinine: 1.33 mg/dL — ABNORMAL HIGH (ref 0.44–1.00)
GFR, Estimated: 41 mL/min — ABNORMAL LOW (ref >60)
Glucose, Bld: 91 mg/dL (ref 70–99)
Potassium: 4.3 mmol/L (ref 3.5–5.1)
Sodium: 139 mmol/L (ref 135–145)
Total Bilirubin: 0.5 mg/dL (ref 0.3–1.2)
Total Protein: 6.9 g/dL (ref 6.5–8.1)

## 2020-12-22 LAB — CBC WITH DIFFERENTIAL (CANCER CENTER ONLY)
Abs Immature Granulocytes: 0.01 10*3/uL (ref 0.00–0.07)
Basophils Absolute: 0.1 10*3/uL (ref 0.0–0.1)
Basophils Relative: 1 %
Eosinophils Absolute: 0.1 10*3/uL (ref 0.0–0.5)
Eosinophils Relative: 1 %
HCT: 33.9 % — ABNORMAL LOW (ref 36.0–46.0)
Hemoglobin: 11.2 g/dL — ABNORMAL LOW (ref 12.0–15.0)
Immature Granulocytes: 0 %
Lymphocytes Relative: 54 %
Lymphs Abs: 2.9 10*3/uL (ref 0.7–4.0)
MCH: 29.4 pg (ref 26.0–34.0)
MCHC: 33 g/dL (ref 30.0–36.0)
MCV: 89 fL (ref 80.0–100.0)
Monocytes Absolute: 0.2 10*3/uL (ref 0.1–1.0)
Monocytes Relative: 3 %
Neutro Abs: 2.2 10*3/uL (ref 1.7–7.7)
Neutrophils Relative %: 41 %
Platelet Count: 216 10*3/uL (ref 150–400)
RBC: 3.81 MIL/uL — ABNORMAL LOW (ref 3.87–5.11)
RDW: 14.7 % (ref 11.5–15.5)
Smear Review: NORMAL
WBC Count: 5.4 10*3/uL (ref 4.0–10.5)
nRBC: 0 % (ref 0.0–0.2)

## 2020-12-26 ENCOUNTER — Encounter (HOSPITAL_COMMUNITY): Payer: Self-pay | Admitting: Emergency Medicine

## 2020-12-26 ENCOUNTER — Other Ambulatory Visit: Payer: Self-pay

## 2020-12-26 LAB — SARS CORONAVIRUS 2 (TAT 6-24 HRS): SARS Coronavirus 2: NEGATIVE

## 2020-12-26 NOTE — Progress Notes (Signed)
Spoke with pt for pre-op call. Pt denies cardiac history, diabetes or pre-diabetes. Pt is treated for HTN and has hx of Breast cancer.   Covid test done today. Pt instructed to wear mask when she is out in public and at home if she has visitors.

## 2020-12-29 ENCOUNTER — Encounter (HOSPITAL_COMMUNITY): Payer: Self-pay | Admitting: Emergency Medicine

## 2020-12-29 ENCOUNTER — Ambulatory Visit (HOSPITAL_COMMUNITY): Payer: Medicare HMO

## 2020-12-29 ENCOUNTER — Other Ambulatory Visit: Payer: Self-pay

## 2020-12-29 ENCOUNTER — Ambulatory Visit (HOSPITAL_COMMUNITY)
Admission: RE | Admit: 2020-12-29 | Discharge: 2020-12-29 | Disposition: A | Discharge status: Home / Self Care | Payer: Medicare HMO | Source: Ambulatory Visit | Attending: Emergency Medicine | Admitting: Emergency Medicine

## 2020-12-29 ENCOUNTER — Ambulatory Visit (HOSPITAL_COMMUNITY): Payer: Medicare HMO | Admitting: Certified Registered Nurse Anesthetist

## 2020-12-29 ENCOUNTER — Encounter (HOSPITAL_COMMUNITY)
Admission: RE | Disposition: A | Discharge status: Home / Self Care | Payer: Self-pay | Source: Ambulatory Visit | Attending: Emergency Medicine

## 2020-12-29 DIAGNOSIS — I10 Essential (primary) hypertension: Secondary | ICD-10-CM | POA: Insufficient documentation

## 2020-12-29 DIAGNOSIS — K219 Gastro-esophageal reflux disease without esophagitis: Secondary | ICD-10-CM | POA: Diagnosis not present

## 2020-12-29 DIAGNOSIS — R9389 Abnormal findings on diagnostic imaging of other specified body structures: Secondary | ICD-10-CM | POA: Diagnosis not present

## 2020-12-29 DIAGNOSIS — R7303 Prediabetes: Secondary | ICD-10-CM | POA: Diagnosis not present

## 2020-12-29 DIAGNOSIS — E7800 Pure hypercholesterolemia, unspecified: Secondary | ICD-10-CM | POA: Insufficient documentation

## 2020-12-29 DIAGNOSIS — Z419 Encounter for procedure for purposes other than remedying health state, unspecified: Secondary | ICD-10-CM

## 2020-12-29 DIAGNOSIS — C3431 Malignant neoplasm of lower lobe, right bronchus or lung: Secondary | ICD-10-CM | POA: Insufficient documentation

## 2020-12-29 DIAGNOSIS — F17210 Nicotine dependence, cigarettes, uncomplicated: Secondary | ICD-10-CM | POA: Diagnosis not present

## 2020-12-29 DIAGNOSIS — K449 Diaphragmatic hernia without obstruction or gangrene: Secondary | ICD-10-CM | POA: Insufficient documentation

## 2020-12-29 DIAGNOSIS — Z853 Personal history of malignant neoplasm of breast: Secondary | ICD-10-CM | POA: Insufficient documentation

## 2020-12-29 DIAGNOSIS — Z9889 Other specified postprocedural states: Secondary | ICD-10-CM

## 2020-12-29 DIAGNOSIS — J984 Other disorders of lung: Secondary | ICD-10-CM | POA: Diagnosis present

## 2020-12-29 DIAGNOSIS — C771 Secondary and unspecified malignant neoplasm of intrathoracic lymph nodes: Secondary | ICD-10-CM | POA: Insufficient documentation

## 2020-12-29 HISTORY — PX: VIDEO BRONCHOSCOPY WITH RADIAL ENDOBRONCHIAL ULTRASOUND: SHX6849

## 2020-12-29 HISTORY — PX: BRONCHIAL WASHINGS: SHX5105

## 2020-12-29 HISTORY — DX: Anxiety disorder, unspecified: F41.9

## 2020-12-29 HISTORY — DX: Pneumonia, unspecified organism: J18.9

## 2020-12-29 HISTORY — PX: BRONCHIAL NEEDLE ASPIRATION BIOPSY: SHX5106

## 2020-12-29 HISTORY — DX: COVID-19: U07.1

## 2020-12-29 HISTORY — PX: FINE NEEDLE ASPIRATION: SHX5430

## 2020-12-29 HISTORY — PX: BRONCHIAL BIOPSY: SHX5109

## 2020-12-29 HISTORY — PX: BRONCHIAL BRUSHINGS: SHX5108

## 2020-12-29 HISTORY — PX: VIDEO BRONCHOSCOPY WITH ENDOBRONCHIAL ULTRASOUND: SHX6177

## 2020-12-29 LAB — GLUCOSE, CAPILLARY: Glucose-Capillary: 130 mg/dL — ABNORMAL HIGH (ref 70–99)

## 2020-12-29 SURGERY — BRONCHOSCOPY, WITH BIOPSY USING ELECTROMAGNETIC NAVIGATION
Anesthesia: General

## 2020-12-29 MED ORDER — OXYCODONE HCL 5 MG/5ML PO SOLN: 5.0000 mg | Freq: Once | ORAL | Status: DC | PRN

## 2020-12-29 MED ORDER — PROPOFOL 10 MG/ML IV BOLUS
INTRAVENOUS | Status: DC | PRN
  Administered 2020-12-29: 110 mg via INTRAVENOUS

## 2020-12-29 MED ORDER — FENTANYL CITRATE (PF) 100 MCG/2ML IJ SOLN: 25.0000 ug | INTRAMUSCULAR | Status: DC | PRN

## 2020-12-29 MED ORDER — SUGAMMADEX SODIUM 200 MG/2ML IV SOLN
INTRAVENOUS | Status: DC | PRN
  Administered 2020-12-29: 200 mg via INTRAVENOUS

## 2020-12-29 MED ORDER — OXYCODONE HCL 5 MG PO TABS: 5.0000 mg | ORAL_TABLET | Freq: Once | ORAL | Status: DC | PRN

## 2020-12-29 MED ORDER — CHLORHEXIDINE GLUCONATE 0.12 % MT SOLN
OROMUCOSAL | Status: AC
  Administered 2020-12-29: 08:00:00 15 mL via OROMUCOSAL
  Filled 2020-12-29: qty 15

## 2020-12-29 MED ORDER — DEXAMETHASONE SODIUM PHOSPHATE 10 MG/ML IJ SOLN
INTRAMUSCULAR | Status: DC | PRN
  Administered 2020-12-29: 10 mg via INTRAVENOUS

## 2020-12-29 MED ORDER — LIDOCAINE 2% (20 MG/ML) 5 ML SYRINGE
INTRAMUSCULAR | Status: DC | PRN
  Administered 2020-12-29: 60 mg via INTRAVENOUS

## 2020-12-29 MED ORDER — ONDANSETRON HCL 4 MG/2ML IJ SOLN: 4.0000 mg | Freq: Four times a day (QID) | INTRAMUSCULAR | Status: DC | PRN

## 2020-12-29 MED ORDER — LACTATED RINGERS IV SOLN: INTRAVENOUS | Status: DC

## 2020-12-29 MED ORDER — CHLORHEXIDINE GLUCONATE 0.12 % MT SOLN: 15.0000 mL | Freq: Once | OROMUCOSAL | Status: AC

## 2020-12-29 MED ORDER — ONDANSETRON HCL 4 MG/2ML IJ SOLN
INTRAMUSCULAR | Status: DC | PRN
  Administered 2020-12-29: 4 mg via INTRAVENOUS

## 2020-12-29 MED ORDER — ROCURONIUM BROMIDE 10 MG/ML (PF) SYRINGE
PREFILLED_SYRINGE | INTRAVENOUS | Status: DC | PRN
  Administered 2020-12-29: 50 mg via INTRAVENOUS

## 2020-12-29 MED ORDER — FENTANYL CITRATE (PF) 100 MCG/2ML IJ SOLN
INTRAMUSCULAR | Status: DC | PRN
  Administered 2020-12-29 (×2): 50 ug via INTRAVENOUS

## 2020-12-29 MED ORDER — PHENYLEPHRINE 40 MCG/ML (10ML) SYRINGE FOR IV PUSH (FOR BLOOD PRESSURE SUPPORT)
PREFILLED_SYRINGE | INTRAVENOUS | Status: DC | PRN
  Administered 2020-12-29 (×2): 80 ug via INTRAVENOUS
  Administered 2020-12-29: 40 ug via INTRAVENOUS

## 2020-12-29 MED ORDER — PHENYLEPHRINE HCL-NACL 20-0.9 MG/250ML-% IV SOLN
INTRAVENOUS | Status: DC | PRN
  Administered 2020-12-29: 40 ug/min via INTRAVENOUS

## 2020-12-29 NOTE — Interval H&P Note (Signed)
History and Physical Interval Note:  12/29/2020 9:21 AM  Audrey [name]  has presented today for surgery, with the diagnosis of RIGHT LOWER LOBE MASS.  The various methods of treatment have been discussed with the patient and family. After consideration of risks, benefits and other options for treatment, the patient has consented to  Procedure(s): ROBOTIC ASSISTED NAVIGATIONAL BRONCHOSCOPY (N/A) VIDEO BRONCHOSCOPY WITH ENDOBRONCHIAL ULTRASOUND (N/A) as a surgical intervention.  The patient's history has been reviewed, patient examined, no change in status, stable for surgery.  I have reviewed the patient's chart and labs.  Questions were answered to the patient's satisfaction.     Collene Gobble

## 2020-12-29 NOTE — Transfer of Care (Signed)
Immediate Anesthesia Transfer of Care Note  Patient: Audrey [name]  Procedure(s) Performed: ROBOTIC ASSISTED NAVIGATIONAL BRONCHOSCOPY VIDEO BRONCHOSCOPY WITH ENDOBRONCHIAL ULTRASOUND RADIAL ENDOBRONCHIAL ULTRASOUND BRONCHIAL BIOPSIES BRONCHIAL NEEDLE ASPIRATION BIOPSIES BRONCHIAL BRUSHINGS BRONCHIAL WASHINGS FINE NEEDLE ASPIRATION (FNA) LINEAR  Patient Location: PACU  Anesthesia Type:General  Level of Consciousness: awake, alert  and oriented  Airway & Oxygen Therapy: Patient Spontanous Breathing and Patient connected to face mask oxygen  Post-op Assessment: Report given to RN and Post -op Vital signs reviewed and stable  Post vital signs: Reviewed and stable  Last Vitals:  Vitals Value Taken Time  BP 138/52 12/29/20 1134  Temp    Pulse 85 12/29/20 1135  Resp 16 12/29/20 1135  SpO2 97 % 12/29/20 1135  Vitals shown include unvalidated device data.  Last Pain:  Vitals:   12/29/20 0732  TempSrc:   PainSc: 0-No pain         Complications: No notable events documented.

## 2020-12-29 NOTE — Anesthesia Preprocedure Evaluation (Signed)
Anesthesia Evaluation  Patient identified by MRN, date of birth, ID band Patient awake    Reviewed: Allergy & Precautions, H&P , NPO status , Patient's Chart, lab work & pertinent test results  History of Anesthesia Complications (+) PONV and history of anesthetic complications  Airway Mallampati: II   Neck ROM: full    Dental   Pulmonary shortness of breath, Current Smoker and Patient abstained from smoking.,  RUL nodule   breath sounds clear to auscultation       Cardiovascular hypertension,  Rhythm:regular Rate:Normal     Neuro/Psych  Headaches, PSYCHIATRIC DISORDERS Anxiety    GI/Hepatic hiatal hernia, GERD  ,  Endo/Other    Renal/GU      Musculoskeletal  (+) Arthritis ,   Abdominal   Peds  Hematology   Anesthesia Other Findings   Reproductive/Obstetrics                             Anesthesia Physical Anesthesia Plan  ASA: 3  Anesthesia Plan: General   Post-op Pain Management:    Induction: Intravenous  PONV Risk Score and Plan: 3 and Ondansetron, Dexamethasone and Treatment may vary due to age or medical condition  Airway Management Planned: Oral ETT  Additional Equipment:   Intra-op Plan:   Post-operative Plan: Extubation in OR  Informed Consent: I have reviewed the patients History and Physical, chart, labs and discussed the procedure including the risks, benefits and alternatives for the proposed anesthesia with the patient or authorized representative who has indicated his/her understanding and acceptance.     Dental advisory given  Plan Discussed with: CRNA, Anesthesiologist and Surgeon  Anesthesia Plan Comments:         Anesthesia Quick Evaluation

## 2020-12-29 NOTE — Anesthesia Procedure Notes (Signed)
Procedure Name: Intubation Date/Time: 12/29/2020 9:44 AM Performed by: Genelle Bal, CRNA Pre-anesthesia Checklist: Patient identified, Emergency Drugs available, Suction available and Patient being monitored Patient Re-evaluated:Patient Re-evaluated prior to induction Oxygen Delivery Method: Circle system utilized Preoxygenation: Pre-oxygenation with 100% oxygen Induction Type: IV induction Ventilation: Mask ventilation without difficulty Laryngoscope Size: Miller and 2 Grade View: Grade III Tube type: Oral Tube size: 8.5 mm Number of attempts: 1 Airway Equipment and Method: Stylet and Oral airway Placement Confirmation: ETT inserted through vocal cords under direct vision, positive ETCO2 and breath sounds checked- equal and bilateral Secured at: 21 cm Tube secured with: Tape Dental Injury: Teeth and Oropharynx as per pre-operative assessment  Comments: Anterior glottis

## 2020-12-29 NOTE — Op Note (Signed)
Video Bronchoscopy with Robotic Assisted Bronchoscopic Navigation and Endobronchial Ultrasound Procedure Note  Date of Operation: 12/29/2020   Pre-op Diagnosis: Right lower lobe mass, mediastinal adenopathy  Post-op Diagnosis: Same  Surgeon: Baltazar Apo  Assistants: None  Anesthesia: General endotracheal anesthesia  Operation: Flexible video fiberoptic bronchoscopy with robotic assistance and biopsies.  Estimated Blood Loss: Minimal  Complications: None  Indications and History: Audrey [name] is a 80 y.o. female with history of breast cancer, former tobacco.  She was found to have a hypermetabolic right lower lobe mass as well as mediastinal lymphadenopathy on PET scan.  Recommendation was made to achieve a tissue diagnosis via navigational bronchoscopy and endobronchial ultrasound with biopsies. The risks, benefits, complications, treatment options and expected outcomes were discussed with the patient.  The possibilities of pneumothorax, pneumonia, reaction to medication, pulmonary aspiration, perforation of a viscus, bleeding, failure to diagnose a condition and creating a complication requiring transfusion or operation were discussed with the patient who freely signed the consent.    Description of Procedure: The patient was seen in the Preoperative Area, was examined and was deemed appropriate to proceed.  The patient was taken to [facility] endoscopy room 3, identified as Audrey [name] and the procedure verified as Flexible Video Fiberoptic Bronchoscopy.  A Time Out was held and the above information confirmed.   Prior to the date of the procedure a high-resolution CT scan of the chest was performed. Utilizing ION software program a virtual tracheobronchial tree was generated to allow the creation of distinct navigation pathways to the patient's parenchymal abnormalities. After being taken to the operating room general anesthesia was initiated and the patient  was orally intubated. The  video fiberoptic bronchoscope was introduced via the endotracheal tube and a general inspection was performed which showed normal right and left lung anatomy, aspiration of the bilateral mainstems was completed to remove any remaining secretions. Robotic catheter inserted into patient's endotracheal tube.   Target #1 right lower lobe nodule: The distinct navigation pathways prepared prior to this procedure were then utilized to navigate to patient's lesion identified on CT scan. The robotic catheter was secured into place and the vision probe was withdrawn.  Lesion location was approximated using fluoroscopy and radial endobronchial ultrasound for peripheral targeting.  Local registration and targeting was performed using Cios three-dimensional imaging.  Under fluoroscopic guidance transbronchial needle brushings, transbronchial needle biopsies, and transbronchial forceps biopsies were performed to be sent for cytology and pathology. A bronchioalveolar lavage was performed in the right lower lobe and sent for cytology.  The robotic scope was then withdrawn and the endobronchial ultrasound was used to identify and characterize the peritracheal, hilar and bronchial lymph nodes. Inspection showed enlargement at station 4R and station 7. Using real-time ultrasound guidance Wang needle biopsies were take from Station 4R and 7 nodes and were sent for cytology.   At the end of the procedure a general airway inspection was performed and there was no evidence of active bleeding. The bronchoscope was removed.  The patient tolerated the procedure well. There was no significant blood loss and there were no obvious complications. A post-procedural chest x-ray is pending.  Samples Target #1: 1. Transbronchial needle brushings from right lower lobe mass 2. Transbronchial Wang needle biopsies from right lower lobe mass 3. Transbronchial forceps biopsies from right lower lobe mass 4. Bronchoalveolar lavage from right  lower lobe  EBUS samples: 1. Wang needle biopsies from 4R node 2. Wang needle biopsies from 7 node   Plans:  The patient will be discharged from the PACU to home when recovered from anesthesia and after chest x-ray is reviewed. We will review the cytology, pathology and microbiology results with the patient when they become available. Outpatient followup will be with Dr. Lamonte Sakai and Dr. Sonny Dandy.   Baltazar Apo, MD, PhD 12/29/2020, 11:26 AM [facility] [phone] or if no answer before 7:00PM call [phone] For any issues after 7:00PM please call eLink [phone]

## 2020-12-29 NOTE — Discharge Instructions (Signed)
Flexible Bronchoscopy, Care After This sheet gives you information about how to care for yourself after your test. Your doctor may also give you more specific instructions. If you have problems or questions, contact your doctor. Follow these instructions at home: Eating and drinking Do not eat or drink anything (not even water) for 2 hours after your test, or until your numbing medicine (local anesthetic) wears off. When your numbness is gone and your cough and gag reflexes have come back, you may: Eat only soft foods. Slowly drink liquids. The day after the test, go back to your normal diet. Driving Do not drive for 24 hours if you were given a medicine to help you relax (sedative). Do not drive or use heavy machinery while taking prescription pain medicine. General instructions  Take over-the-counter and prescription medicines only as told by your doctor. Return to your normal activities as told. Ask what activities are safe for you. Do not use any products that have nicotine or tobacco in them. This includes cigarettes and e-cigarettes. If you need help quitting, ask your doctor. Keep all follow-up visits as told by your doctor. This is important. It is very important if you had a tissue sample (biopsy) taken. Get help right away if: You have shortness of breath that gets worse. You get light-headed. You feel like you are going to pass out (faint). You have chest pain. You cough up: More than a little blood. More blood than before. Summary Do not eat or drink anything (not even water) for 2 hours after your test, or until your numbing medicine wears off. Do not use cigarettes. Do not use e-cigarettes. Get help right away if you have chest pain.  Please call our office for any questions or concerns.  [phone].  This information is not intended to replace advice given to you by your health care provider. Make sure you discuss any questions you have with your health care  provider. Document Released: 10/25/2008 Document Revised: 12/10/2016 Document Reviewed: 01/16/2016 Elsevier Patient Education  2020 [employer].

## 2020-12-30 LAB — CYTOLOGY - NON PAP

## 2020-12-31 NOTE — Anesthesia Postprocedure Evaluation (Signed)
Anesthesia Post Note  Patient: Audrey [name]  Procedure(s) Performed: ROBOTIC ASSISTED NAVIGATIONAL BRONCHOSCOPY VIDEO BRONCHOSCOPY WITH ENDOBRONCHIAL ULTRASOUND RADIAL ENDOBRONCHIAL ULTRASOUND BRONCHIAL BIOPSIES BRONCHIAL NEEDLE ASPIRATION BIOPSIES BRONCHIAL BRUSHINGS BRONCHIAL WASHINGS FINE NEEDLE ASPIRATION (FNA) LINEAR     Patient location during evaluation: PACU Anesthesia Type: General Level of consciousness: awake and alert Pain management: pain level controlled Vital Signs Assessment: post-procedure vital signs reviewed and stable Respiratory status: spontaneous breathing, nonlabored ventilation, respiratory function stable and patient connected to nasal cannula oxygen Cardiovascular status: blood pressure returned to baseline and stable Postop Assessment: no apparent nausea or vomiting Anesthetic complications: no   No notable events documented.  Last Vitals:  Vitals:   12/29/20 1205 12/29/20 1220  BP: (!) 136/51 (!) 137/55  Pulse: 82 80  Resp: 15 (!) 22  Temp:  36.8 C  SpO2: 95% 95%    Last Pain:  Vitals:   12/29/20 1220  TempSrc:   PainSc: 0-No pain   Pain Goal:                   Ionna Avis S

## 2021-01-01 ENCOUNTER — Encounter (HOSPITAL_COMMUNITY): Payer: Self-pay | Admitting: Emergency Medicine

## 2021-01-02 ENCOUNTER — Telehealth: Payer: Self-pay | Admitting: Emergency Medicine

## 2021-01-02 NOTE — Telephone Encounter (Signed)
Called to review bronchoscopy path results w the patient. 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

## 2021-01-06 NOTE — Telephone Encounter (Signed)
Attempted to call pt to review FOB results. No answer, left VM and will try back. I did review them w Dr Lindi Adie > adeno that favors lung primary, although IHC couldn't be done due to scant cell block. Hopefully this will be adequate, but may need to consider repeat bx to differentiate between breast and primary lung.

## 2021-01-07 ENCOUNTER — Other Ambulatory Visit: Payer: Self-pay

## 2021-01-07 DIAGNOSIS — Z170 Estrogen receptor positive status [ER+]: Secondary | ICD-10-CM

## 2021-01-08 ENCOUNTER — Inpatient Hospital Stay: Payer: Medicare HMO

## 2021-01-08 ENCOUNTER — Other Ambulatory Visit: Payer: Self-pay

## 2021-01-08 ENCOUNTER — Inpatient Hospital Stay: Payer: Medicare HMO | Admitting: Pharmacist

## 2021-01-08 VITALS — BP 136/43 | HR 88 | Temp 97.4°F | Resp 18 | Wt 139.3 lb

## 2021-01-08 DIAGNOSIS — Z170 Estrogen receptor positive status [ER+]: Secondary | ICD-10-CM

## 2021-01-08 DIAGNOSIS — C50411 Malignant neoplasm of upper-outer quadrant of right female breast: Secondary | ICD-10-CM | POA: Diagnosis not present

## 2021-01-08 LAB — CBC WITH DIFFERENTIAL (CANCER CENTER ONLY)
Abs Immature Granulocytes: 0.07 10*3/uL (ref 0.00–0.07)
Basophils Absolute: 0.1 10*3/uL (ref 0.0–0.1)
Basophils Relative: 1 %
Eosinophils Absolute: 0 10*3/uL (ref 0.0–0.5)
Eosinophils Relative: 0 %
HCT: 32.2 % — ABNORMAL LOW (ref 36.0–46.0)
Hemoglobin: 10.5 g/dL — ABNORMAL LOW (ref 12.0–15.0)
Immature Granulocytes: 1 %
Lymphocytes Relative: 44 %
Lymphs Abs: 4.4 10*3/uL — ABNORMAL HIGH (ref 0.7–4.0)
MCH: 29.4 pg (ref 26.0–34.0)
MCHC: 32.6 g/dL (ref 30.0–36.0)
MCV: 90.2 fL (ref 80.0–100.0)
Monocytes Absolute: 0.7 10*3/uL (ref 0.1–1.0)
Monocytes Relative: 7 %
Neutro Abs: 4.7 10*3/uL (ref 1.7–7.7)
Neutrophils Relative %: 47 %
Platelet Count: 379 10*3/uL (ref 150–400)
RBC: 3.57 MIL/uL — ABNORMAL LOW (ref 3.87–5.11)
RDW: 17.1 % — ABNORMAL HIGH (ref 11.5–15.5)
WBC Count: 10.1 10*3/uL (ref 4.0–10.5)
nRBC: 0 % (ref 0.0–0.2)

## 2021-01-08 LAB — MAGNESIUM: Magnesium: 1.8 mg/dL (ref 1.7–2.4)

## 2021-01-08 LAB — CMP (CANCER CENTER ONLY)
ALT: 10 U/L (ref 0–44)
AST: 11 U/L — ABNORMAL LOW (ref 15–41)
Albumin: 4.2 g/dL (ref 3.5–5.0)
Alkaline Phosphatase: 74 U/L (ref 38–126)
Anion gap: 9 (ref 5–15)
BUN: 25 mg/dL — ABNORMAL HIGH (ref 8–23)
CO2: 24 mmol/L (ref 22–32)
Calcium: 9.4 mg/dL (ref 8.9–10.3)
Chloride: 105 mmol/L (ref 98–111)
Creatinine: 1.19 mg/dL — ABNORMAL HIGH (ref 0.44–1.00)
GFR, Estimated: 47 mL/min — ABNORMAL LOW (ref >60)
Glucose, Bld: 113 mg/dL — ABNORMAL HIGH (ref 70–99)
Potassium: 3.8 mmol/L (ref 3.5–5.1)
Sodium: 138 mmol/L (ref 135–145)
Total Bilirubin: 0.3 mg/dL (ref 0.3–1.2)
Total Protein: 7 g/dL (ref 6.5–8.1)

## 2021-01-08 NOTE — Progress Notes (Signed)
Audrey [name]       Telephone: [phone]?Fax: [phone]   Oncology Clinical Pharmacist Practitioner Progress Note  Audrey [name] was contacted via in-person visit to discuss her chemotherapy regimen for palbociclib which they receive under the care of Dr. Nicholas Lose.   Current treatment regimen and start date Palbociclib (12/08/20) Anastrozole (08/14/19)  Interval History She continues on palbociclib 100 mg by mouth  daily on days 1 to 21 of a 28-day cycle. This is being given  in combination with anastrozole . Therapy is planned to continue until disease progression or unacceptable toxicity.   Response to Therapy Audrey [name] is doing well.  She was last seen by pharmacy clinic on December 1.  She last saw Dr. Lindi Adie on December 12.  She reports today that she was supposed to start her next cycle of palbociclib on December 26 (this past Monday).  However, she states she was never called by [facility] who supplies manufacturer assistance through [employer].  Our oral chemotherapy pharmacist was kind enough to reach out to [employer] while Audrey [name] was in pharmacy clinic today, and [facility] [facility] called Audrey [name] almost immediately to set up shipment for tomorrow.  We discussed with Audrey [name] that in the future, if she has not heard from [facility] by the time she starts her off week of palbociclib, that she should reach out to them at [phone].  This number was provided to Ms. Kelley today.  Because Audrey [name] takes her palbociclib in the morning, we have instructed her to start her next cycle on Saturday, December 31.  Audrey [name] did inquire about the biopsy that was done on December 19.  She said that Dr Agustina Caroli office had contacted her but she had not heard back from them as of today.  Clinical pharmacy explained that it would probably be best to go over the detailed results and next steps with Dr. Lamonte Sakai or with Dr. Lindi Adie.  I told Audrey [name] that I will forward my  visit notes from today to both physicians.  Dr. Lindi Adie will be back next week.  Ms. 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  We explained that further discussion on next steps would be best discussed with Dr. Lamonte Sakai or Dr. Lindi Adie.  Ms. Childers verbalized understanding.  For now, Audrey [name] will continue on palbociclib and anastrozole starting her new cycle of palbociclib this Saturday. Labs, vitals, treatment parameters, and manufacturer guidelines assessing toxicity were reviewed with Audrey Che today. Based on these values, patient is in agreement to continue therapy at this time.  Allergies Allergies  Allergen Reactions   Ativan [Lorazepam] Itching   Aspirin Other (See Comments)    Stomach pain   Codeine Nausea And Vomiting   Penicillins Rash    Vitals Vitals with BMI 01/08/2021 12/29/2020 12/29/2020  Height - - -  Weight 139 lbs 5 oz - -  BMI 57.84 - -  Systolic 696 295 284  Diastolic 43 55 51  Pulse 88 80 82     Laboratory Data CBC EXTENDED Latest Ref Rng & Units 01/08/2021 12/22/2020 12/11/2020  WBC 4.0 - 10.5 K/uL 10.1 5.4 9.5  RBC 3.87 - 5.11 MIL/uL 3.57(L) 3.81(L) 4.28  HGB 12.0 - 15.0 g/dL 10.5(L) 11.2(L) 12.7  HCT 36.0 - 46.0 % 32.2(L) 33.9(L) 36.9  PLT 150 - 400 K/uL 379 216 376  NEUTROABS 1.7 - 7.7 K/uL 4.7 2.2 5.0  LYMPHSABS 0.7 - 4.0 K/uL 4.4(H) 2.9 3.8    CMP Latest Ref Rng & Units 01/08/2021 12/22/2020 12/11/2020  Glucose 70 - 99 mg/dL 113(H) 91 91  BUN 8 - 23 mg/dL 25(H) 20 24(H)  Creatinine 0.44 - 1.00 mg/dL 1.19(H) 1.33(H) 1.37(H)  Sodium 135 - 145 mmol/L 138 139 139  Potassium 3.5 - 5.1 mmol/L 3.8 4.3 4.4  Chloride 98 - 111 mmol/L 105 106 105  CO2 22 - 32 mmol/L 24 24 23   Calcium 8.9 - 10.3 mg/dL 9.4 9.4 9.2  Total Protein 6.5 - 8.1 g/dL 7.0 6.9 7.4  Total Bilirubin 0.3 - 1.2 mg/dL 0.3 0.5 0.3  Alkaline Phos 38 - 126 U/L 74 75 105  AST 15 - 41 U/L 11(L) 14(L) 12(L)   ALT 0 - 44 U/L 10 12 13     Lab Results  Component Value Date   MG 1.8 01/08/2021   MG 2.0 12/11/2020   MG 2.0 11/27/2020     Adverse Effects Assessment No adverse effects noted during our visit Reviewed the CDC definition of fever which is 100.4 F or higher and to call the clinic should this or any new symptoms occur  Adherence Assessment LILIAS LORENSEN reports missing 0 doses over the past 3 weeks due to adherence. Reason for missed dose: [facility] did not ship palbociclib in time Patient was re-educated on importance of adherence.   Access Assessment [name] is currently receiving her palbociclib through  [facility] Gaffer assistance through [employer])   Insurance concerns:  none  Medication Reconciliation The patient's medication list was reviewed today with the patient? Yes New medications or herbal supplements have recently been started? No  Any medications have been discontinued? No  The medication list was updated and reconciled based on the patient's most recent medication list in the electronic medical record (EMR) including herbal products and OTC medications.   Medications Current Outpatient Medications  Medication Sig Dispense Refill   ALPRAZolam (XANAX) 0.25 MG tablet Take 0.25 mg by mouth at bedtime as needed for sleep.     amLODipine (NORVASC) 2.5 MG tablet Take 2.5 mg by mouth daily.     anastrozole (ARIMIDEX) 1 MG tablet Take 1 tablet (1 mg total) by mouth daily. 90 tablet 3   cholecalciferol (VITAMIN D3) 25 MCG (1000 UNIT) tablet Take 1,000 Units by mouth daily.     dicyclomine (BENTYL) 20 MG tablet Take 20 mg by mouth 3 (three) times daily as needed for spasms.     hyoscyamine (LEVBID) 0.375 MG 12 hr tablet Take 0.375 mg by mouth every 12 (twelve) hours as needed for cramping.     ketotifen (ZADITOR) 0.025 % ophthalmic solution Place 1 drop into both eyes 2 (two) times daily as needed (dry eyes).     omeprazole (PRILOSEC) 20 MG capsule Take 20 mg  by mouth daily as needed (acid reflux).     palbociclib (IBRANCE) 100 MG capsule Take 1 capsule (100 mg total) by mouth daily with breakfast. Take whole with food. Take for 21 days on, 7 days off, repeat every 28 days. 21 capsule 3   polyethylene glycol (MIRALAX / GLYCOLAX) 17 g packet Take 17 g by mouth daily as needed for moderate constipation.     No current facility-administered medications for this visit.    Drug-Drug Interactions (DDIs) DDIs were evaluated? Yes Significant DDIs? No , continue to avoid grapefruit products The patient was instructed to speak with their health care provider and/or the oral chemotherapy pharmacist  before starting any new drug, including prescription or over the counter, natural / herbal products, or vitamins.  Supportive Care Fever: reviewed the importance of having a thermometer and the Centers for Disease Control and Prevention (CDC) definition of fever which is 100.109F (38C) or higher. Patient should call 24/7 triage at [phone] if experiencing a fever or any other symptoms Myelosuppression ILD / Pneumonitis: reviewed warning signs Fatigue Alopecia N/V/D  Dosing Assessment Hepatic adjustments needed? No  Renal adjustments needed? No  Toxicity adjustments needed? No  The current dosing regimen is appropriate to continue at this time.  Follow-Up Plan Forwarding visit note from today to Dr. Lindi Adie and Dr. Lamonte Sakai so they are aware that Ms. Kerrick is inquiring about the biopsy results from 12/29/20 AudreyShaheen tentatively has CT CAP ordered but not scheduled as of yet. Last done on 10/21/20. If continuing palbociclib, labs in 2 weeks with pharmacy clinic visit If continuing palbociclib, labs in 4 weeks with Dr. Lindi Adie visit  Audrey Che participated in the discussion, expressed understanding, and voiced agreement with the above plan. All questions were answered to her satisfaction. The patient was advised to contact the clinic at [phone]  [phone] with any questions or concerns prior to her return visit.   I spent 30 minutes assessing and educating the patient.  Raina Mina, RPH-CPP, 01/08/2021  3:14 PM

## 2021-01-13 ENCOUNTER — Other Ambulatory Visit: Payer: Self-pay | Admitting: *Deleted

## 2021-01-13 NOTE — Telephone Encounter (Signed)
Called and spoke with pt's daughter Audrey [name] letting her know results of bronchoscopy per RB and she verbalized understanding. Stated to her that RB has also discussed this with Dr. Lindi Adie and she verbalized understanding. Nothing further needed.

## 2021-01-21 ENCOUNTER — Other Ambulatory Visit: Payer: Self-pay | Admitting: *Deleted

## 2021-01-21 DIAGNOSIS — Z170 Estrogen receptor positive status [ER+]: Secondary | ICD-10-CM

## 2021-01-21 NOTE — Assessment & Plan Note (Signed)
H/Oright breast cancer on 2017and 1999: Lumpectomy.(Bilateral mastectomies in 2017: Seen by Dr. Lavera Guise, could not tolerate antiestrogen therapy) Chest CT on 06/06/19 showed a mass in the right axilla and a mass in the right lower lung lobe. PET scan on 07/03/19 showed a right lower lobe pulmonary nodule concerning for malignancy, the right axillary mass consistent with local breast cancer recurrence, and small hypermetabolic right axillary and neck nodes consistent with metastatic adenopathy. Biopsy of the right axilla on 07/13/19 showed carcinoma consistent with breast carcinoma, HER-2 negative (1+), ER+ 95%, PR+ 75%, Ki67 25%. ---------------------------------------------------------------------------------------------------------------------------------------------- Current treatment: Anastrozole 1 mg daily started 08/14/2019 She is movedin with her daughter. CT Chest 02/20/20:RLL lung mass is similar in size (3.4 cm), Multiple non specific new lung nodules 5 mm or less, T2 sclerotic lesion same CT chest abdomen and pelvis 06/21/2020: Persistent right axillary lymphadenopathy stable. Spiculated right lower lobe lung mass, slow growth. Minimal interval enlargement of small right lower lobe lung nodule CT chest 10/21/2020: Interval enlargement of the spiculated subpleural mass from 3.1 cm to 3.6 cm (? Lung primary), bulky right axillary lymph nodes measuring 4 cm (previously 2.5 cm) PET CT scan 11/07/2020: Progression of right axillary nodal metastases. New hypermetabolic mediastinal and right hilar metastases compared to before. Interval increase in the size of the hypermetabolic right lower lobe nodular mass Radiology review:There is clear-cut progression of disease both in the lung mass as well as the axillary lymph nodes.  Lung nodule:Patient is seeing Dr. Kyung Rudd.  Current treatment:Ibrancewith anastrozole (switched from [facility] because of diarrhea) Ibrance toxicities: Labs have  been reviewed. Diarrhea has resolved.  Return to clinic in 3 months for follow-up

## 2021-01-22 ENCOUNTER — Inpatient Hospital Stay: Payer: Medicare HMO | Admitting: Hematology and Oncology

## 2021-01-22 ENCOUNTER — Inpatient Hospital Stay: Payer: Medicare HMO | Attending: Hematology and Oncology

## 2021-01-22 ENCOUNTER — Inpatient Hospital Stay: Payer: Medicare HMO | Admitting: Pharmacist

## 2021-01-22 ENCOUNTER — Telehealth: Payer: Self-pay

## 2021-01-22 ENCOUNTER — Other Ambulatory Visit: Payer: Self-pay

## 2021-01-22 ENCOUNTER — Other Ambulatory Visit (HOSPITAL_COMMUNITY): Payer: Self-pay

## 2021-01-22 DIAGNOSIS — C7800 Secondary malignant neoplasm of unspecified lung: Secondary | ICD-10-CM | POA: Insufficient documentation

## 2021-01-22 DIAGNOSIS — Z880 Allergy status to penicillin: Secondary | ICD-10-CM | POA: Insufficient documentation

## 2021-01-22 DIAGNOSIS — Z886 Allergy status to analgesic agent status: Secondary | ICD-10-CM | POA: Insufficient documentation

## 2021-01-22 DIAGNOSIS — R042 Hemoptysis: Secondary | ICD-10-CM | POA: Insufficient documentation

## 2021-01-22 DIAGNOSIS — Z170 Estrogen receptor positive status [ER+]: Secondary | ICD-10-CM

## 2021-01-22 DIAGNOSIS — C50411 Malignant neoplasm of upper-outer quadrant of right female breast: Secondary | ICD-10-CM | POA: Diagnosis present

## 2021-01-22 DIAGNOSIS — Z885 Allergy status to narcotic agent status: Secondary | ICD-10-CM | POA: Diagnosis not present

## 2021-01-22 DIAGNOSIS — R5383 Other fatigue: Secondary | ICD-10-CM | POA: Insufficient documentation

## 2021-01-22 DIAGNOSIS — Z79811 Long term (current) use of aromatase inhibitors: Secondary | ICD-10-CM | POA: Insufficient documentation

## 2021-01-22 DIAGNOSIS — Z9013 Acquired absence of bilateral breasts and nipples: Secondary | ICD-10-CM | POA: Diagnosis not present

## 2021-01-22 DIAGNOSIS — Z79899 Other long term (current) drug therapy: Secondary | ICD-10-CM | POA: Insufficient documentation

## 2021-01-22 LAB — CMP (CANCER CENTER ONLY)
ALT: 9 U/L (ref 0–44)
AST: 10 U/L — ABNORMAL LOW (ref 15–41)
Albumin: 4.3 g/dL (ref 3.5–5.0)
Alkaline Phosphatase: 74 U/L (ref 38–126)
Anion gap: 8 (ref 5–15)
BUN: 30 mg/dL — ABNORMAL HIGH (ref 8–23)
CO2: 26 mmol/L (ref 22–32)
Calcium: 9.8 mg/dL (ref 8.9–10.3)
Chloride: 106 mmol/L (ref 98–111)
Creatinine: 1.42 mg/dL — ABNORMAL HIGH (ref 0.44–1.00)
GFR, Estimated: 38 mL/min — ABNORMAL LOW (ref >60)
Glucose, Bld: 94 mg/dL (ref 70–99)
Potassium: 4.5 mmol/L (ref 3.5–5.1)
Sodium: 140 mmol/L (ref 135–145)
Total Bilirubin: 0.5 mg/dL (ref 0.3–1.2)
Total Protein: 7 g/dL (ref 6.5–8.1)

## 2021-01-22 LAB — CBC WITH DIFFERENTIAL (CANCER CENTER ONLY)
Abs Immature Granulocytes: 0 10*3/uL (ref 0.00–0.07)
Basophils Absolute: 0.3 10*3/uL — ABNORMAL HIGH (ref 0.0–0.1)
Basophils Relative: 4 %
Eosinophils Absolute: 0 10*3/uL (ref 0.0–0.5)
Eosinophils Relative: 0 %
HCT: 33.8 % — ABNORMAL LOW (ref 36.0–46.0)
Hemoglobin: 11.3 g/dL — ABNORMAL LOW (ref 12.0–15.0)
Lymphocytes Relative: 49 %
Lymphs Abs: 3.2 10*3/uL (ref 0.7–4.0)
MCH: 30.3 pg (ref 26.0–34.0)
MCHC: 33.4 g/dL (ref 30.0–36.0)
MCV: 90.6 fL (ref 80.0–100.0)
Monocytes Absolute: 0.1 10*3/uL (ref 0.1–1.0)
Monocytes Relative: 2 %
Neutro Abs: 2.9 10*3/uL (ref 1.7–7.7)
Neutrophils Relative %: 45 %
Platelet Count: 382 10*3/uL (ref 150–400)
RBC: 3.73 MIL/uL — ABNORMAL LOW (ref 3.87–5.11)
RDW: 17.1 % — ABNORMAL HIGH (ref 11.5–15.5)
Smear Review: NORMAL
WBC Count: 6.5 10*3/uL (ref 4.0–10.5)
nRBC: 0 % (ref 0.0–0.2)

## 2021-01-22 MED ORDER — PALBOCICLIB 75 MG PO CAPS: 75.0000 mg | ORAL_CAPSULE | Freq: Every day | ORAL | 3 refills | Status: DC

## 2021-01-22 MED ORDER — PALBOCICLIB 75 MG PO CAPS
75.0000 mg | ORAL_CAPSULE | Freq: Every day | ORAL | 3 refills | Status: DC
  Filled 2021-01-22: qty 21, 21d supply, fill #0
  Filled 2021-01-26: qty 21, 28d supply, fill #0
  Filled 2021-02-13: qty 21, 28d supply, fill #1

## 2021-01-22 NOTE — Telephone Encounter (Signed)
Oral Oncology Patient Advocate Encounter   Was successful in securing patient a $4000 grant from [employer] to provide copayment coverage for Ibrance.  This will keep the out of pocket expense at $0.      The billing information is as follows and has been shared with [place].   Member ID: [id number] Group ID: [id number] RxBin: [id number] PCN: [id number] Dates of Eligibility: 01/22/21 through 01/22/22  Fund name:  Breast.  [place] Patient [place] Phone [phone] Fax [phone] 01/22/2021 2:42 PM

## 2021-01-22 NOTE — Progress Notes (Signed)
Patient Care Team: [name], MD as PCP - General (Family Medicine)  DIAGNOSIS:  Encounter Diagnosis  Name Primary?   Malignant neoplasm of upper-outer quadrant of right breast in female, estrogen receptor positive ([place])     SUMMARY OF ONCOLOGIC HISTORY: Oncology History  Malignant neoplasm of upper-outer quadrant of right breast in female, estrogen receptor positive ([place])  07/13/2019 Initial Diagnosis   History of right breast cancer on 2017. Chest CT on 06/06/19 showed a mass in the right axilla and a mass in the right lower lung lobe. PET scan on 07/03/19 showed a right lower lobe pulmonary nodule concerning for malignancy, the right axillary mass consistent with local breast cancer recurrence, and small hypermetabolic right axillary and neck nodes consistent with metastatic adenopathy. Biopsy of the right axilla on 07/13/19 showed carcinoma consistent with breast carcinoma, HER-2 negative (1+), ER+ 95%, PR+ 75%, Ki67 25%.   08/14/2019 -  Anti-estrogen oral therapy   Initially anastrozole alone,   Verzenio October 2022 (Verzenio discontinued for diarrhea), switched to [facility] 12/08/2020     CHIEF COMPLIANT: Severe fatigue from Ibrance  INTERVAL HISTORY: Audrey [name] is a 81 year old above-mentioned history of metastatic breast cancer who had lung nodule which was biopsied and she is here today to discuss the pathology report.  The biopsy came back as adenocarcinoma and they could not tell whether it is breast or lung primary.  Post procedure she had slight amount of hemoptysis and then since then she has been feeling very weak and tired.  Restart Verzenio and switch her to [place] and she appears to be tolerating Ibrance significantly better however she still has significant fatigue and lack of interest in doing anything.  At the same time she is also having difficulty with sleep at night.   ALLERGIES:  is allergic to ativan [lorazepam], aspirin, codeine, and  penicillins.  MEDICATIONS:  Current Outpatient Medications  Medication Sig Dispense Refill   ALPRAZolam (XANAX) 0.25 MG tablet Take 0.25 mg by mouth at bedtime as needed for sleep.     amLODipine (NORVASC) 2.5 MG tablet Take 2.5 mg by mouth daily.     anastrozole (ARIMIDEX) 1 MG tablet Take 1 tablet (1 mg total) by mouth daily. 90 tablet 3   cholecalciferol (VITAMIN D3) 25 MCG (1000 UNIT) tablet Take 1,000 Units by mouth daily.     dicyclomine (BENTYL) 20 MG tablet Take 20 mg by mouth 3 (three) times daily as needed for spasms.     hyoscyamine (LEVBID) 0.375 MG 12 hr tablet Take 0.375 mg by mouth every 12 (twelve) hours as needed for cramping.     ketotifen (ZADITOR) 0.025 % ophthalmic solution Place 1 drop into both eyes 2 (two) times daily as needed (dry eyes).     omeprazole (PRILOSEC) 20 MG capsule Take 20 mg by mouth daily as needed (acid reflux).     palbociclib (IBRANCE) 100 MG capsule Take 1 capsule (100 mg total) by mouth daily with breakfast. Take whole with food. Take for 21 days on, 7 days off, repeat every 28 days. 21 capsule 3   polyethylene glycol (MIRALAX / GLYCOLAX) 17 g packet Take 17 g by mouth daily as needed for moderate constipation.     No current facility-administered medications for this visit.    PHYSICAL EXAMINATION: ECOG PERFORMANCE STATUS: 1 - Symptomatic but completely ambulatory  Vitals:   01/22/21 1005  BP: (!) 152/81  Pulse: 85  Resp: 18  Temp: 97.9 F (36.6 C)  SpO2: 96%  Filed Weights   01/22/21 1005  Weight: 137 lb 12.8 oz (62.5 kg)     LABORATORY DATA:  I have reviewed the data as listed CMP Latest Ref Rng & Units 01/22/2021 01/08/2021 12/22/2020  Glucose 70 - 99 mg/dL 94 113(H) 91  BUN 8 - 23 mg/dL 30(H) 25(H) 20  Creatinine 0.44 - 1.00 mg/dL 1.42(H) 1.19(H) 1.33(H)  Sodium 135 - 145 mmol/L 140 138 139  Potassium 3.5 - 5.1 mmol/L 4.5 3.8 4.3  Chloride 98 - 111 mmol/L 106 105 106  CO2 22 - 32 mmol/L 26 24 24   Calcium 8.9 - 10.3 mg/dL  9.8 9.4 9.4  Total Protein 6.5 - 8.1 g/dL 7.0 7.0 6.9  Total Bilirubin 0.3 - 1.2 mg/dL 0.5 0.3 0.5  Alkaline Phos 38 - 126 U/L 74 74 75  AST 15 - 41 U/L 10(L) 11(L) 14(L)  ALT 0 - 44 U/L 9 10 12     Lab Results  Component Value Date   WBC 6.5 01/22/2021   HGB 11.3 (L) 01/22/2021   HCT 33.8 (L) 01/22/2021   MCV 90.6 01/22/2021   PLT 382 01/22/2021   NEUTROABS 2.9 01/22/2021    ASSESSMENT & PLAN:  Malignant neoplasm of upper-outer quadrant of right breast in female, estrogen receptor positive ([place]) H/O right breast cancer on 2017 and 1999: Lumpectomy.  (Bilateral mastectomies in 2017: Seen by Dr. Lavera Guise, could not tolerate antiestrogen therapy) Chest CT on 06/06/19 showed a mass in the right axilla and a mass in the right lower lung lobe. PET scan on 07/03/19 showed a right lower lobe pulmonary nodule concerning for malignancy, the right axillary mass consistent with local breast cancer recurrence, and small hypermetabolic right axillary and neck nodes consistent with metastatic adenopathy. Biopsy of the right axilla on 07/13/19 showed carcinoma consistent with breast carcinoma, HER-2 negative (1+), ER+ 95%, PR+ 75%, Ki67 25%. ---------------------------------------------------------------------------------------------------------------------------------------------- Current treatment: Anastrozole 1 mg daily started 08/14/2019 She is moved in with her daughter. CT Chest 02/20/20: RLL lung mass is similar in size (3.4 cm), Multiple non specific new lung nodules 5 mm or less, T2 sclerotic lesion same CT chest abdomen and pelvis 06/21/2020: Persistent right axillary lymphadenopathy stable.  Spiculated right lower lobe lung mass, slow growth.  Minimal interval enlargement of small right lower lobe lung nodule CT chest 10/21/2020: Interval enlargement of the spiculated subpleural mass from 3.1 cm to 3.6 cm (?  Lung primary), bulky right axillary lymph nodes measuring 4 cm (previously 2.5 cm) PET  CT scan 11/07/2020: Progression of right axillary nodal metastases.  New hypermetabolic mediastinal and right hilar metastases compared to before.  Interval increase in the size of the hypermetabolic right lower lobe nodular mass  Radiology review: There is clear-cut progression of disease both in the lung mass as well as the axillary lymph nodes.   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 Melina Copa who will look at the slides once again and see if there is any possibility to do estrogen receptor staining.  Current treatment: Ibrance with anastrozole (switched from [place] because of diarrhea) Ibrance toxicities: Labs have been reviewed. We will decrease the dosage of Ibrance because she is having profound fatigue.  The labs however look excellent. Diarrhea has resolved.   Return to clinic in 1 month for follow-up.  No orders of the defined types were placed in this encounter.  The patient has a good  understanding of the overall plan. she agrees with it. she will call with any problems that may develop before the next visit here. Total time spent: 30 mins including face to face time and time spent for planning, charting and co-ordination of care   Harriette Ohara, MD 01/22/21

## 2021-01-22 NOTE — Telephone Encounter (Signed)
Oral Oncology Pharmacist Encounter  Prescription refill for Ibrance sent to [facility] in error. Patient now has grants that will cover her copay cost.  Prescription redirected to [facility].   Drema Halon, PharmD Hematology/Oncology Clinical Pharmacist [facility] [phone] 01/22/2021 2:17 PM

## 2021-01-22 NOTE — Telephone Encounter (Signed)
Oral Oncology Patient Advocate Encounter  Was successful in securing patient a $15000 grant from [employer] to provide copayment coverage for [place].  This will keep the out of pocket expense at $0.     [employer] ID: [id number]   The billing information is as follows and has been shared with [place]: [id number] PCN: [id number] Member ID: [id number] Group ID: [id number] Dates of Eligibility: 12/23/20 through 12/22/21  Fund:  [place] Patient [place] Phone [phone] Fax [phone] 01/22/2021 2:44 PM

## 2021-01-26 ENCOUNTER — Other Ambulatory Visit (HOSPITAL_COMMUNITY): Payer: Self-pay

## 2021-01-27 ENCOUNTER — Other Ambulatory Visit: Payer: Self-pay | Admitting: *Deleted

## 2021-02-02 ENCOUNTER — Ambulatory Visit (HOSPITAL_COMMUNITY)
Admission: RE | Admit: 2021-02-02 | Discharge: 2021-02-02 | Disposition: A | Discharge status: Home / Self Care | Payer: Medicare HMO | Source: Ambulatory Visit | Attending: Hematology and Oncology | Admitting: Hematology and Oncology

## 2021-02-02 ENCOUNTER — Other Ambulatory Visit: Payer: Self-pay

## 2021-02-02 DIAGNOSIS — C50411 Malignant neoplasm of upper-outer quadrant of right female breast: Secondary | ICD-10-CM | POA: Insufficient documentation

## 2021-02-02 DIAGNOSIS — R918 Other nonspecific abnormal finding of lung field: Secondary | ICD-10-CM | POA: Diagnosis present

## 2021-02-02 DIAGNOSIS — Z170 Estrogen receptor positive status [ER+]: Secondary | ICD-10-CM | POA: Insufficient documentation

## 2021-02-02 MED ORDER — IOHEXOL 300 MG/ML  SOLN
60.0000 mL | Freq: Once | INTRAMUSCULAR | Status: AC | PRN
  Administered 2021-02-02: 60 mL via INTRAVENOUS

## 2021-02-04 ENCOUNTER — Other Ambulatory Visit: Payer: Self-pay

## 2021-02-04 DIAGNOSIS — Z170 Estrogen receptor positive status [ER+]: Secondary | ICD-10-CM

## 2021-02-05 ENCOUNTER — Other Ambulatory Visit: Payer: Self-pay

## 2021-02-05 ENCOUNTER — Ambulatory Visit: Payer: Medicare HMO | Admitting: Hematology and Oncology

## 2021-02-05 ENCOUNTER — Inpatient Hospital Stay: Payer: Medicare HMO

## 2021-02-05 ENCOUNTER — Inpatient Hospital Stay: Payer: Medicare HMO | Admitting: Pharmacist

## 2021-02-05 VITALS — BP 162/57 | HR 79 | Temp 97.5°F | Resp 18 | Ht 62.5 in | Wt 141.1 lb

## 2021-02-05 DIAGNOSIS — C50411 Malignant neoplasm of upper-outer quadrant of right female breast: Secondary | ICD-10-CM

## 2021-02-05 DIAGNOSIS — Z170 Estrogen receptor positive status [ER+]: Secondary | ICD-10-CM

## 2021-02-05 LAB — CBC WITH DIFFERENTIAL (CANCER CENTER ONLY)
Abs Immature Granulocytes: 0.02 10*3/uL (ref 0.00–0.07)
Basophils Absolute: 0.1 10*3/uL (ref 0.0–0.1)
Basophils Relative: 2 %
Eosinophils Absolute: 0.1 10*3/uL (ref 0.0–0.5)
Eosinophils Relative: 1 %
HCT: 32 % — ABNORMAL LOW (ref 36.0–46.0)
Hemoglobin: 10.7 g/dL — ABNORMAL LOW (ref 12.0–15.0)
Immature Granulocytes: 0 %
Lymphocytes Relative: 51 %
Lymphs Abs: 3.2 10*3/uL (ref 0.7–4.0)
MCH: 31 pg (ref 26.0–34.0)
MCHC: 33.4 g/dL (ref 30.0–36.0)
MCV: 92.8 fL (ref 80.0–100.0)
Monocytes Absolute: 0.3 10*3/uL (ref 0.1–1.0)
Monocytes Relative: 4 %
Neutro Abs: 2.6 10*3/uL (ref 1.7–7.7)
Neutrophils Relative %: 42 %
Platelet Count: 309 10*3/uL (ref 150–400)
RBC: 3.45 MIL/uL — ABNORMAL LOW (ref 3.87–5.11)
RDW: 18.2 % — ABNORMAL HIGH (ref 11.5–15.5)
Smear Review: NORMAL
WBC Count: 6.2 10*3/uL (ref 4.0–10.5)
nRBC: 0 % (ref 0.0–0.2)

## 2021-02-05 LAB — CMP (CANCER CENTER ONLY)
ALT: 8 U/L (ref 0–44)
AST: 10 U/L — ABNORMAL LOW (ref 15–41)
Albumin: 4.4 g/dL (ref 3.5–5.0)
Alkaline Phosphatase: 72 U/L (ref 38–126)
Anion gap: 6 (ref 5–15)
BUN: 18 mg/dL (ref 8–23)
CO2: 27 mmol/L (ref 22–32)
Calcium: 9.7 mg/dL (ref 8.9–10.3)
Chloride: 106 mmol/L (ref 98–111)
Creatinine: 1.27 mg/dL — ABNORMAL HIGH (ref 0.44–1.00)
GFR, Estimated: 43 mL/min — ABNORMAL LOW (ref >60)
Glucose, Bld: 84 mg/dL (ref 70–99)
Potassium: 4.2 mmol/L (ref 3.5–5.1)
Sodium: 139 mmol/L (ref 135–145)
Total Bilirubin: 0.4 mg/dL (ref 0.3–1.2)
Total Protein: 7 g/dL (ref 6.5–8.1)

## 2021-02-05 LAB — MAGNESIUM: Magnesium: 1.9 mg/dL (ref 1.7–2.4)

## 2021-02-05 NOTE — Progress Notes (Signed)
[place]       Telephone: [phone]?Fax: [phone]   Oncology Clinical Pharmacist Practitioner Progress Note  Audrey [name] was contacted via in-person visit to discuss her chemotherapy regimen for palbociclib which they receive under the care of Dr. Nicholas Lose.   Current treatment regimen and start date Palbociclib (12/08/20) Anastrozole (08/14/19)  Interval History She continues on palbociclib 75 mg by mouth  daily on days 1 to 21 of a 28-day cycle. This is being given  in combination with anastrozole . Therapy is planned to continue until disease progression or unacceptable toxicity.   Response to Therapy Audrey [name] is doing well.  She last saw clinical pharmacy on December 29.  She last saw Dr. Lindi Adie on January 12 of this year.  At Dr. Geralyn Flash visit, her dose of palbociclib was reduced to 75 mg daily days 1 through 20 one of the 28-day cycle.  She states that she started this current cycle on January 14.  She is now receiving this medication through [employer] and was approved for a grant.  Clinical pharmacy did discuss with Dr. Lindi Adie Audrey [name] latest scans, and Dr. Lindi Adie felt that palbociclib was okay to continue at this time.  Audrey [name] and her daughter inquired about the scans and this information was given to them.  Audrey [name] does report some sinus pressure and she recently was prescribed azithromycin by her PCP.  She feels that she is continuing to have some symptoms and we discussed today that she could consider taking guaifenesin with dextromethorphan for congestion and she has been taking fexofenadine as well.  Written instructions were provided to the patient stating that if she continues to have symptoms to contact her PCP within 1 week or if symptoms worsen to contact her PCP sooner.  We again reviewed the definition that the CDC gives for fever and Audrey [name] knows to contact us should she develop fevers or any new or  worsening symptoms.  She has a follow-up visit scheduled with Dr. Lindi Adie on February 15 and she will see clinical pharmacy again on March 15. Labs, vitals, treatment parameters, and manufacturer guidelines assessing toxicity were reviewed with Audrey [name] today. Based on these values, patient is in agreement to continue therapy at this time.  Allergies Allergies  Allergen Reactions   Ativan [Lorazepam] Itching   Aspirin Other (See Comments)    Stomach pain   Codeine Nausea And Vomiting   Penicillins Rash    Vitals Vitals with BMI 02/05/2021 01/22/2021 01/08/2021  Height 5' 2.5" 5' 2.5" -  Weight 141 lbs 2 oz 137 lbs 13 oz 139 lbs 5 oz  BMI 25.38 44.03 47.42  Systolic 595 638 756  Diastolic 57 81 43  Pulse 79 85 88     Laboratory Data CBC EXTENDED Latest Ref Rng & Units 02/05/2021 01/22/2021 01/08/2021  WBC 4.0 - 10.5 K/uL 6.2 6.5 10.1  RBC 3.87 - 5.11 MIL/uL 3.45(L) 3.73(L) 3.57(L)  HGB 12.0 - 15.0 g/dL 10.7(L) 11.3(L) 10.5(L)  HCT 36.0 - 46.0 % 32.0(L) 33.8(L) 32.2(L)  PLT 150 - 400 K/uL 309 382 379  NEUTROABS 1.7 - 7.7 K/uL 2.6 2.9 4.7  LYMPHSABS 0.7 - 4.0 K/uL 3.2 3.2 4.4(H)    CMP Latest Ref Rng & Units 02/05/2021 01/22/2021 01/08/2021  Glucose 70 - 99 mg/dL 84 94 113(H)  BUN 8 - 23 mg/dL 18 30(H) 25(H)  Creatinine 0.44 - 1.00 mg/dL 1.27(H) 1.42(H) 1.19(H)  Sodium 135 -  145 mmol/L 139 140 138  Potassium 3.5 - 5.1 mmol/L 4.2 4.5 3.8  Chloride 98 - 111 mmol/L 106 106 105  CO2 22 - 32 mmol/L 27 26 24   Calcium 8.9 - 10.3 mg/dL 9.7 9.8 9.4  Total Protein 6.5 - 8.1 g/dL 7.0 7.0 7.0  Total Bilirubin 0.3 - 1.2 mg/dL 0.4 0.5 0.3  Alkaline Phos 38 - 126 U/L 72 74 74  AST 15 - 41 U/L 10(L) 10(L) 11(L)  ALT 0 - 44 U/L 8 9 10     Lab Results  Component Value Date   MG 1.9 02/05/2021   MG 1.8 01/08/2021   MG 2.0 12/11/2020     Adverse Effects Assessment Not reporting adverse effects from palbociclib at this time As above, discussed some OTC considerations for sinus  congestion  Adherence Assessment Audrey [name] reports missing 0 doses over the past 2 weeks.   Reason for missed dose: N/A Patient was re-educated on importance of adherence.   Access Assessment Audrey [name] is currently receiving her palbociclib through [facility] concerns:  none  Medication Reconciliation The patient's medication list was reviewed today with the patient? Yes New medications or herbal supplements have recently been started? No  Any medications have been discontinued? No  The medication list was updated and reconciled based on the patient's most recent medication list in the electronic medical record (EMR) including herbal products and OTC medications.   Medications Current Outpatient Medications  Medication Sig Dispense Refill   ALPRAZolam (XANAX) 0.25 MG tablet Take 0.25 mg by mouth at bedtime as needed for sleep.     amLODipine (NORVASC) 2.5 MG tablet Take 2.5 mg by mouth daily.     anastrozole (ARIMIDEX) 1 MG tablet Take 1 tablet (1 mg total) by mouth daily. 90 tablet 3   cholecalciferol (VITAMIN D3) 25 MCG (1000 UNIT) tablet Take 1,000 Units by mouth daily.     dicyclomine (BENTYL) 20 MG tablet Take 20 mg by mouth 3 (three) times daily as needed for spasms.     hyoscyamine (LEVBID) 0.375 MG 12 hr tablet Take 0.375 mg by mouth every 12 (twelve) hours as needed for cramping.     ketotifen (ZADITOR) 0.025 % ophthalmic solution Place 1 drop into both eyes 2 (two) times daily as needed (dry eyes).     omeprazole (PRILOSEC) 20 MG capsule Take 20 mg by mouth daily as needed (acid reflux).     palbociclib (IBRANCE) 75 MG capsule Take 1 capsule (75 mg total) by mouth daily with breakfast. Take whole with food. Take for 21 days on, 7 days off, repeat every 28 days. 21 capsule 3   polyethylene glycol (MIRALAX / GLYCOLAX) 17 g packet Take 17 g by mouth daily as needed for moderate constipation.     No current facility-administered  medications for this visit.    Drug-Drug Interactions (DDIs) DDIs were evaluated? Yes Significant DDIs? No  The patient was instructed to speak with their health care provider and/or the oral chemotherapy pharmacist before starting any new drug, including prescription or over the counter, natural / herbal products, or vitamins.  Supportive Care Fever: reviewed the importance of having a thermometer and the Centers for Disease Control and Prevention (CDC) definition of fever which is 100.24F (38C) or higher. Patient should call 24/7 triage at [phone] if experiencing a fever or any other symptoms Myelosuppression ILD / Pneumonitis Fatigue Alopecia N/V/D  Dosing Assessment Hepatic adjustments needed? No  Renal adjustments  needed? No  Toxicity adjustments needed? No  The current dosing regimen is appropriate to continue at this time.  Follow-Up Plan Labs / Dr. Lindi Adie visit on 02/25/21 Labs / pharmacy clinic visit on 03/25/21 Audrey [name] will monitor her sinus congestion and follow up with her PCP should symptoms worsen or continue > 7 days  Audrey [name] participated in the discussion, expressed understanding, and voiced agreement with the above plan. All questions were answered to her satisfaction. The patient was advised to contact the clinic at [phone] with any questions or concerns prior to her return visit.   I spent 30 minutes assessing and educating the patient.  Raina Mina, RPH-CPP, 02/05/2021  11:51 AM  **Disclaimer: This note was dictated with voice recognition software. Similar sounding words can inadvertently be transcribed and this note may contain transcription errors which may not have been corrected upon publication of note.**

## 2021-02-05 NOTE — Patient Instructions (Signed)
° °  For cough and congestion:  - Guaifenesin and dextromethorphan (Delsym DM or Tussin DM)  - Fexofenadine (Allegra)  - contact PCP if symptoms do not resolve in 1 week or sooner if symptoms get worse

## 2021-02-13 ENCOUNTER — Other Ambulatory Visit (HOSPITAL_COMMUNITY): Payer: Self-pay

## 2021-02-17 ENCOUNTER — Other Ambulatory Visit (HOSPITAL_COMMUNITY): Payer: Self-pay

## 2021-02-17 ENCOUNTER — Encounter (HOSPITAL_COMMUNITY): Payer: Self-pay

## 2021-02-24 NOTE — Assessment & Plan Note (Signed)
H/Oright breast cancer on 2017and 1999: Lumpectomy.(Bilateral mastectomies in 2017: Seen by Dr. Lavera Guise, could not tolerate antiestrogen therapy) Chest CT on 06/06/19 showed a mass in the right axilla and a mass in the right lower lung lobe. PET scan on 07/03/19 showed a right lower lobe pulmonary nodule concerning for malignancy, the right axillary mass consistent with local breast cancer recurrence, and small hypermetabolic right axillary and neck nodes consistent with metastatic adenopathy. Biopsy of the right axilla on 07/13/19 showed carcinoma consistent with breast carcinoma, HER-2 negative (1+), ER+ 95%, PR+ 75%, Ki67 25%. ---------------------------------------------------------------------------------------------------------------------------------------------- Current treatment: Anastrozole 1 mg daily started 08/14/2019 She is movedin with her daughter. CT Chest 02/20/20:RLL lung mass is similar in size (3.4 cm), Multiple non specific new lung nodules 5 mm or less, T2 sclerotic lesion same CT chest abdomen and pelvis 06/21/2020: Persistent right axillary lymphadenopathy stable. Spiculated right lower lobe lung mass, slow growth. Minimal interval enlargement of small right lower lobe lung nodule CT chest 10/21/2020: Interval enlargement of the spiculated subpleural mass from 3.1 cm to 3.6 cm (? Lung primary), bulky right axillary lymph nodes measuring 4 cm (previously 2.5 cm) PET CT scan 11/07/2020: Progression of right axillary nodal metastases. New hypermetabolic mediastinal and right hilar metastases compared to before. Interval increase in the size of the hypermetabolic right lower lobe nodular mass Radiology review:There is clear-cut progression of disease both in the lung mass as well as the axillary lymph nodes.  Lung nodule:Status post bronchoscopy and biopsy December 2022: Adenocarcinoma (did not have enough tissue to run immunohistochemistry) I called and discussed the  case with Dr. Melina Copa who will look at the slides once again and see if there is any possibility to do estrogen receptor staining.  Current treatment:Ibrancewith anastrozole(switched from Verzenio because of diarrhea)  Ibrance toxicities: Labs have been reviewed. We will decrease the dosage of Ibrance because she is having profound fatigue.  The labs however look excellent. Diarrhea has resolved.  Return to clinic in1 month for follow-up.

## 2021-02-24 NOTE — Progress Notes (Signed)
Patient Care Team: [name], MD as PCP - General (Family Medicine)  DIAGNOSIS:    ICD-10-CM   1. Malignant neoplasm of upper-outer quadrant of right breast in female, estrogen receptor positive ([place])  C50.411    Z17.0       SUMMARY OF ONCOLOGIC HISTORY: Oncology History  Malignant neoplasm of upper-outer quadrant of right breast in female, estrogen receptor positive ([place])  07/13/2019 Initial Diagnosis   History of right breast cancer on 2017. Chest CT on 06/06/19 showed a mass in the right axilla and a mass in the right lower lung lobe. PET scan on 07/03/19 showed a right lower lobe pulmonary nodule concerning for malignancy, the right axillary mass consistent with local breast cancer recurrence, and small hypermetabolic right axillary and neck nodes consistent with metastatic adenopathy. Biopsy of the right axilla on 07/13/19 showed carcinoma consistent with breast carcinoma, HER-2 negative (1+), ER+ 95%, PR+ 75%, Ki67 25%.   08/14/2019 -  Anti-estrogen oral therapy   Initially anastrozole alone,   Verzenio October 2022 (Verzenio discontinued for diarrhea), switched to [facility] 12/08/2020     CHIEF COMPLIANT: Follow-up metastatic breast cancer on Ibrance  INTERVAL HISTORY: Audrey [name] is a 81 y.o. with above-mentioned history of metastatic breast cancer with right axillary masses and mediastinal and hilar lymphadenopathy and lung mass along with bone metastases currently on anastrozole and Ibrance. She reports to clinic today for follow-up.  The dosage of Ibrance was reduced because she was complaining of profound and worsening fatigue.  In spite of lowering the dosage her fatigue did not improve.  ALLERGIES:  is allergic to ativan [lorazepam], aspirin, codeine, and penicillins.  MEDICATIONS:  Current Outpatient Medications  Medication Sig Dispense Refill   ALPRAZolam (XANAX) 0.25 MG tablet Take 0.25 mg by mouth at bedtime as needed for sleep.     amLODipine (NORVASC) 2.5 MG  tablet Take 2.5 mg by mouth daily.     anastrozole (ARIMIDEX) 1 MG tablet Take 1 tablet (1 mg total) by mouth daily. 90 tablet 3   cholecalciferol (VITAMIN D3) 25 MCG (1000 UNIT) tablet Take 1,000 Units by mouth daily.     dicyclomine (BENTYL) 20 MG tablet Take 20 mg by mouth 3 (three) times daily as needed for spasms.     hyoscyamine (LEVBID) 0.375 MG 12 hr tablet Take 0.375 mg by mouth every 12 (twelve) hours as needed for cramping.     ketotifen (ZADITOR) 0.025 % ophthalmic solution Place 1 drop into both eyes 2 (two) times daily as needed (dry eyes).     omeprazole (PRILOSEC) 20 MG capsule Take 20 mg by mouth daily as needed (acid reflux).     palbociclib (IBRANCE) 75 MG capsule Take 1 capsule (75 mg total) by mouth daily with breakfast. Take whole with food. Take for 21 days on, 7 days off, repeat every 28 days. 21 capsule 3   polyethylene glycol (MIRALAX / GLYCOLAX) 17 g packet Take 17 g by mouth daily as needed for moderate constipation.     No current facility-administered medications for this visit.    PHYSICAL EXAMINATION: ECOG PERFORMANCE STATUS: 1 - Symptomatic but completely ambulatory  Vitals:   02/25/21 1135  BP: (!) 156/56  Pulse: 83  Resp: 18  Temp: 97.8 F (36.6 C)  SpO2: 100%   Filed Weights   02/25/21 1135  Weight: 143 lb 6.4 oz (65 kg)      LABORATORY DATA:  I have reviewed the data as listed CMP Latest Ref Rng &  Units 02/05/2021 01/22/2021 01/08/2021  Glucose 70 - 99 mg/dL 84 94 113(H)  BUN 8 - 23 mg/dL 18 30(H) 25(H)  Creatinine 0.44 - 1.00 mg/dL 1.27(H) 1.42(H) 1.19(H)  Sodium 135 - 145 mmol/L 139 140 138  Potassium 3.5 - 5.1 mmol/L 4.2 4.5 3.8  Chloride 98 - 111 mmol/L 106 106 105  CO2 22 - 32 mmol/L _0 Calcium 8.9 - 10.3 mg/dL 9.7 9.8 9.4  Total Protein 6.5 - 8.1 g/dL 7.0 7.0 7.0  Total Bilirubin 0.3 - 1.2 mg/dL 0.4 0.5 0.3  Alkaline Phos 38 - 126 U/L 72 74 74  AST 15 - 41 U/L 10(L) 10(L) 11(L)  ALT 0 - 44 U/L _1 Lab Results   Component Value Date   WBC 7.0 02/25/2021   HGB 11.0 (L) 02/25/2021   HCT 32.3 (L) 02/25/2021   MCV 95.6 02/25/2021   PLT 271 02/25/2021   NEUTROABS 2.8 02/25/2021    ASSESSMENT & PLAN:  Malignant neoplasm of upper-outer quadrant of right breast in female, estrogen receptor positive ([employer]) H/O right breast cancer on 2017 and 1999: Lumpectomy.  (Bilateral mastectomies in 2017: Seen by Dr. Lavera Guise, could not tolerate antiestrogen therapy) Chest CT on 06/06/19 showed a mass in the right axilla and a mass in the right lower lung lobe. PET scan on 07/03/19 showed a right lower lobe pulmonary nodule concerning for malignancy, the right axillary mass consistent with local breast cancer recurrence, and small hypermetabolic right axillary and neck nodes consistent with metastatic adenopathy. Biopsy of the right axilla on 07/13/19 showed carcinoma consistent with breast carcinoma, HER-2 negative (1+), ER+ 95%, PR+ 75%, Ki67 25%. ---------------------------------------------------------------------------------------------------------------------------------------------- Current treatment: Anastrozole 1 mg daily started 08/14/2019 She is moved in with her daughter. CT Chest 02/20/20: RLL lung mass is similar in size (3.4 cm), Multiple non specific new lung nodules 5 mm or less, T2 sclerotic lesion same CT chest abdomen and pelvis 06/21/2020: Persistent right axillary lymphadenopathy stable.  Spiculated right lower lobe lung mass, slow growth.  Minimal interval enlargement of small right lower lobe lung nodule CT chest 10/21/2020: Interval enlargement of the spiculated subpleural mass from 3.1 cm to 3.6 cm (?  Lung primary), bulky right axillary lymph nodes measuring 4 cm (previously 2.5 cm) PET CT scan 11/07/2020: Progression of right axillary nodal metastases.  New hypermetabolic mediastinal and right hilar metastases compared to before.  Interval increase in the size of the hypermetabolic right lower lobe  nodular mass  Radiology review: There is clear-cut progression of disease both in the lung mass as well as the axillary lymph nodes.   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 Melina Copa who will look at the slides once again and see if there is any possibility to do estrogen receptor staining.   Current treatment: Ibrance with anastrozole (switched from [place] because of diarrhea)  Ibrance toxicities: Labs have been reviewed. Recommended increasing the dosage of Ibrance to 100 mg a day.   Return to clinic in 1 month for follow-up.    No orders of the defined types were placed in this encounter.  The patient has a good understanding of the overall plan. she agrees with it. she will call with any problems that may develop before the next visit here.  Total time spent: 30 mins including face to face time and time spent for planning, charting  and coordination of care  Rulon Eisenmenger, MD, MPH 02/25/2021  I, Thana Ates, am acting as scribe for Dr. Nicholas Lose.  I have reviewed the above documentation for accuracy and completeness, and I agree with the above.

## 2021-02-25 ENCOUNTER — Inpatient Hospital Stay: Payer: Medicare HMO | Admitting: Hematology and Oncology

## 2021-02-25 ENCOUNTER — Other Ambulatory Visit: Payer: Self-pay

## 2021-02-25 ENCOUNTER — Inpatient Hospital Stay: Payer: Medicare HMO | Attending: Hematology and Oncology

## 2021-02-25 ENCOUNTER — Other Ambulatory Visit (HOSPITAL_COMMUNITY): Payer: Self-pay

## 2021-02-25 DIAGNOSIS — Z9013 Acquired absence of bilateral breasts and nipples: Secondary | ICD-10-CM | POA: Diagnosis not present

## 2021-02-25 DIAGNOSIS — Z886 Allergy status to analgesic agent status: Secondary | ICD-10-CM | POA: Diagnosis not present

## 2021-02-25 DIAGNOSIS — R5383 Other fatigue: Secondary | ICD-10-CM | POA: Insufficient documentation

## 2021-02-25 DIAGNOSIS — C50411 Malignant neoplasm of upper-outer quadrant of right female breast: Secondary | ICD-10-CM

## 2021-02-25 DIAGNOSIS — C7801 Secondary malignant neoplasm of right lung: Secondary | ICD-10-CM | POA: Diagnosis not present

## 2021-02-25 DIAGNOSIS — C773 Secondary and unspecified malignant neoplasm of axilla and upper limb lymph nodes: Secondary | ICD-10-CM | POA: Diagnosis not present

## 2021-02-25 DIAGNOSIS — Z79899 Other long term (current) drug therapy: Secondary | ICD-10-CM | POA: Diagnosis not present

## 2021-02-25 DIAGNOSIS — Z170 Estrogen receptor positive status [ER+]: Secondary | ICD-10-CM

## 2021-02-25 DIAGNOSIS — Z880 Allergy status to penicillin: Secondary | ICD-10-CM | POA: Insufficient documentation

## 2021-02-25 DIAGNOSIS — Z79811 Long term (current) use of aromatase inhibitors: Secondary | ICD-10-CM | POA: Diagnosis not present

## 2021-02-25 DIAGNOSIS — Z885 Allergy status to narcotic agent status: Secondary | ICD-10-CM | POA: Diagnosis not present

## 2021-02-25 LAB — CMP (CANCER CENTER ONLY)
ALT: 8 U/L (ref 0–44)
AST: 12 U/L — ABNORMAL LOW (ref 15–41)
Albumin: 4.4 g/dL (ref 3.5–5.0)
Alkaline Phosphatase: 69 U/L (ref 38–126)
Anion gap: 6 (ref 5–15)
BUN: 24 mg/dL — ABNORMAL HIGH (ref 8–23)
CO2: 28 mmol/L (ref 22–32)
Calcium: 9.9 mg/dL (ref 8.9–10.3)
Chloride: 105 mmol/L (ref 98–111)
Creatinine: 1.19 mg/dL — ABNORMAL HIGH (ref 0.44–1.00)
GFR, Estimated: 47 mL/min — ABNORMAL LOW (ref >60)
Glucose, Bld: 90 mg/dL (ref 70–99)
Potassium: 4.4 mmol/L (ref 3.5–5.1)
Sodium: 139 mmol/L (ref 135–145)
Total Bilirubin: 0.3 mg/dL (ref 0.3–1.2)
Total Protein: 6.9 g/dL (ref 6.5–8.1)

## 2021-02-25 LAB — CBC WITH DIFFERENTIAL (CANCER CENTER ONLY)
Abs Immature Granulocytes: 0.03 10*3/uL (ref 0.00–0.07)
Basophils Absolute: 0.1 10*3/uL (ref 0.0–0.1)
Basophils Relative: 1 %
Eosinophils Absolute: 0 10*3/uL (ref 0.0–0.5)
Eosinophils Relative: 0 %
HCT: 32.3 % — ABNORMAL LOW (ref 36.0–46.0)
Hemoglobin: 11 g/dL — ABNORMAL LOW (ref 12.0–15.0)
Immature Granulocytes: 0 %
Lymphocytes Relative: 50 %
Lymphs Abs: 3.5 10*3/uL (ref 0.7–4.0)
MCH: 32.5 pg (ref 26.0–34.0)
MCHC: 34.1 g/dL (ref 30.0–36.0)
MCV: 95.6 fL (ref 80.0–100.0)
Monocytes Absolute: 0.6 10*3/uL (ref 0.1–1.0)
Monocytes Relative: 9 %
Neutro Abs: 2.8 10*3/uL (ref 1.7–7.7)
Neutrophils Relative %: 40 %
Platelet Count: 271 10*3/uL (ref 150–400)
RBC: 3.38 MIL/uL — ABNORMAL LOW (ref 3.87–5.11)
RDW: 18.3 % — ABNORMAL HIGH (ref 11.5–15.5)
WBC Count: 7 10*3/uL (ref 4.0–10.5)
nRBC: 0 % (ref 0.0–0.2)

## 2021-02-25 MED ORDER — PALBOCICLIB 100 MG PO CAPS
100.0000 mg | ORAL_CAPSULE | Freq: Every day | ORAL | 6 refills | Status: DC
  Filled 2021-02-25: qty 21, 21d supply, fill #0
  Filled 2021-03-17: qty 21, 28d supply, fill #0

## 2021-03-05 ENCOUNTER — Other Ambulatory Visit (HOSPITAL_COMMUNITY): Payer: Self-pay

## 2021-03-17 ENCOUNTER — Other Ambulatory Visit (HOSPITAL_COMMUNITY): Payer: Self-pay

## 2021-03-18 ENCOUNTER — Telehealth: Payer: Self-pay

## 2021-03-18 ENCOUNTER — Other Ambulatory Visit (HOSPITAL_COMMUNITY): Payer: Self-pay

## 2021-03-18 DIAGNOSIS — C50411 Malignant neoplasm of upper-outer quadrant of right female breast: Secondary | ICD-10-CM

## 2021-03-18 MED ORDER — PALBOCICLIB 100 MG PO TABS
100.0000 mg | ORAL_TABLET | Freq: Every day | ORAL | 6 refills | Status: DC
  Filled 2021-03-18: qty 21, 28d supply, fill #0
  Filled 2021-04-13: qty 21, 28d supply, fill #1
  Filled 2021-05-15: qty 21, 28d supply, fill #2

## 2021-03-18 NOTE — Telephone Encounter (Signed)
Oral Oncology Pharmacist Encounter ? ?Prescription refill for Ibrance capsules sent to [facility]. Ibrance switched from capsules to tablets as [facility] is no longer carrying the capsules.  ? ?Drema Halon, PharmD ?Hematology/Oncology Clinical Pharmacist ?[facility] ?[phone] ?03/18/2021 9:03 AM ? ? ? ?

## 2021-03-19 ENCOUNTER — Other Ambulatory Visit (HOSPITAL_COMMUNITY): Payer: Self-pay

## 2021-03-19 NOTE — Progress Notes (Signed)
? ?Patient Care Team: ?[name], MD as PCP - General (Family Medicine) ? ?DIAGNOSIS:  ?Encounter Diagnosis  ?Name Primary?  ? Malignant neoplasm of upper-outer quadrant of right breast in female, estrogen receptor positive ([place])   ? ? ?SUMMARY OF ONCOLOGIC HISTORY: ?Oncology History  ?Malignant neoplasm of upper-outer quadrant of right breast in female, estrogen receptor positive ([place])  ?07/13/2019 Initial Diagnosis  ? History of right breast cancer on 2017. Chest CT on 06/06/19 showed a mass in the right axilla and a mass in the right lower lung lobe. PET scan on 07/03/19 showed a right lower lobe pulmonary nodule concerning for malignancy, the right axillary mass consistent with local breast cancer recurrence, and small hypermetabolic right axillary and neck nodes consistent with metastatic adenopathy. Biopsy of the right axilla on 07/13/19 showed carcinoma consistent with breast carcinoma, HER-2 negative (1+), ER+ 95%, PR+ 75%, Ki67 25%. ?  ?08/14/2019 -  Anti-estrogen oral therapy  ? Initially anastrozole alone,   Verzenio October 2022 (Verzenio discontinued for diarrhea), switched to [facility] 12/08/2020 ?  ? ? ?CHIEF COMPLIANT Severe fatigue from Ibrance:  ? ?INTERVAL HISTORY: Audrey [name] is a  ?81 year old above-mentioned history of metastatic breast cancer. She presents to the clinic today for a follow-up. ? ?ALLERGIES:  is allergic to ativan [lorazepam], aspirin, codeine, and penicillins. ? ?MEDICATIONS:  ?Current Outpatient Medications  ?Medication Sig Dispense Refill  ? amLODipine (NORVASC) 2.5 MG tablet Take 2.5 mg by mouth daily.    ? anastrozole (ARIMIDEX) 1 MG tablet Take 1 tablet (1 mg total) by mouth daily. 90 tablet 3  ? cholecalciferol (VITAMIN D3) 25 MCG (1000 UNIT) tablet Take 1,000 Units by mouth daily.    ? dicyclomine (BENTYL) 20 MG tablet Take 20 mg by mouth 3 (three) times daily as needed for spasms.    ? hyoscyamine (LEVBID) 0.375 MG 12 hr tablet Take 0.375 mg by mouth every 12  (twelve) hours as needed for cramping.    ? ketotifen (ZADITOR) 0.025 % ophthalmic solution Place 1 drop into both eyes 2 (two) times daily as needed (dry eyes).    ? omeprazole (PRILOSEC) 20 MG capsule Take 20 mg by mouth daily as needed (acid reflux).    ? palbociclib (IBRANCE) 100 MG tablet Take 1 tablet (100 mg total) by mouth daily. Take for 21 days on, 7 days off, repeat every 28 days. 21 tablet 6  ? polyethylene glycol (MIRALAX / GLYCOLAX) 17 g packet Take 17 g by mouth daily as needed for moderate constipation.    ? ?No current facility-administered medications for this visit.  ? ? ?PHYSICAL EXAMINATION: ?ECOG PERFORMANCE STATUS: 1 - Symptomatic but completely ambulatory ? ?Vitals:  ? 03/23/21 1358  ?BP: (!) 162/68  ?Pulse: 94  ?Resp: 18  ?Temp: (!) 97.4 ?F (36.3 ?C)  ?SpO2: 100%  ? ?Filed Weights  ? 03/23/21 1358  ?Weight: 143 lb 8 oz (65.1 kg)  ? ?  ? ?LABORATORY DATA:  ?I have reviewed the data as listed ?CMP Latest Ref Rng & Units 03/23/2021 02/25/2021 02/05/2021  ?Glucose 70 - 99 mg/dL 118(H) 90 84  ?BUN 8 - 23 mg/dL 23 24(H) 18  ?Creatinine 0.44 - 1.00 mg/dL 1.28(H) 1.19(H) 1.27(H)  ?Sodium 135 - 145 mmol/L 140 139 139  ?Potassium 3.5 - 5.1 mmol/L 3.8 4.4 4.2  ?Chloride 98 - 111 mmol/L 105 105 106  ?CO2 22 - 32 mmol/L 28 28 27   ?Calcium 8.9 - 10.3 mg/dL 9.7 9.9 9.7  ?Total Protein 6.5 -  8.1 g/dL 6.7 6.9 7.0  ?Total Bilirubin 0.3 - 1.2 mg/dL 0.4 0.3 0.4  ?Alkaline Phos 38 - 126 U/L 68 69 72  ?AST 15 - 41 U/L 12(L) 12(L) 10(L)  ?ALT 0 - 44 U/L 10 8 8   ? ? ?Lab Results  ?Component Value Date  ? WBC 5.5 03/23/2021  ? HGB 10.9 (L) 03/23/2021  ? HCT 31.8 (L) 03/23/2021  ? MCV 97.2 03/23/2021  ? PLT 220 03/23/2021  ? NEUTROABS PENDING 03/23/2021  ? ? ?ASSESSMENT & PLAN:  ?Malignant neoplasm of upper-outer quadrant of right breast in female, estrogen receptor positive ([place]) ?H/O right breast cancer on 2017 and 1999: Lumpectomy.  (Bilateral mastectomies in 2017: Seen by Dr. Lavera Guise, could not tolerate  antiestrogen therapy) ?Chest CT on 06/06/19 showed a mass in the right axilla and a mass in the right lower lung lobe. ?PET scan on 07/03/19 showed a right lower lobe pulmonary nodule concerning for malignancy, the right axillary mass consistent with local breast cancer recurrence, and small hypermetabolic right axillary and neck nodes consistent with metastatic adenopathy. ?Biopsy of the right axilla on 07/13/19 showed carcinoma consistent with breast carcinoma, HER-2 negative (1+), ER+ 95%, PR+ 75%, Ki67 25%. ?---------------------------------------------------------------------------------------------------------------------------------------------- ?Current treatment: Anastrozole 1 mg daily started 08/14/2019 with [facility] ? ?PET CT scan 11/07/2020: Progression of right axillary nodal metastases.  New hypermetabolic mediastinal and right hilar metastases compared to before.  Interval increase in the size of the hypermetabolic right lower lobe nodular mass  ?Radiology review: There is clear-cut progression of disease both in the lung mass as well as the axillary lymph nodes. ?  ?CT chest 02/03/2021: Enlarging mediastinal lymph nodes compatible with worsening of metastatic disease.  Spiculated right lower lobe lung mass.  Bulky right axillary adenopathy unchanged T2 vertebral body sclerotic lesion ? 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  ?Current treatment: Ibrance started 11/27/20 with anastrozole (switched from [facility] because of diarrhea) ?  ?Ibrance toxicities: Labs have been reviewed. ?  ?Return to clinic in 1 month for follow-up. ?Our plan is to perform scans in May 2023 ? ? ? ?No orders of the defined types were placed in this encounter. ? ?The patient has a good understanding of the overall plan. she agrees with it. she will call with any problems that may develop before the next visit here. ?Total time spent: 30  mins including face to face time and time spent for planning, charting and co-ordination of care ? ? Harriette Ohara, MD ?03/23/21 ? ?I, Deritra Mcnairy am acting as a Education administrator for Dr. Lindi Adie  ?  ?

## 2021-03-23 ENCOUNTER — Inpatient Hospital Stay: Payer: Medicare HMO | Attending: Hematology and Oncology | Admitting: Hematology and Oncology

## 2021-03-23 ENCOUNTER — Other Ambulatory Visit: Payer: Self-pay

## 2021-03-23 ENCOUNTER — Inpatient Hospital Stay: Payer: Medicare HMO

## 2021-03-23 ENCOUNTER — Other Ambulatory Visit (HOSPITAL_COMMUNITY): Payer: Self-pay

## 2021-03-23 VITALS — BP 162/68 | HR 94 | Temp 97.4°F | Resp 18 | Ht 62.5 in | Wt 143.5 lb

## 2021-03-23 DIAGNOSIS — Z9013 Acquired absence of bilateral breasts and nipples: Secondary | ICD-10-CM | POA: Insufficient documentation

## 2021-03-23 DIAGNOSIS — Z9889 Other specified postprocedural states: Secondary | ICD-10-CM | POA: Insufficient documentation

## 2021-03-23 DIAGNOSIS — Z79899 Other long term (current) drug therapy: Secondary | ICD-10-CM | POA: Insufficient documentation

## 2021-03-23 DIAGNOSIS — R5383 Other fatigue: Secondary | ICD-10-CM | POA: Insufficient documentation

## 2021-03-23 DIAGNOSIS — R918 Other nonspecific abnormal finding of lung field: Secondary | ICD-10-CM | POA: Diagnosis not present

## 2021-03-23 DIAGNOSIS — Z170 Estrogen receptor positive status [ER+]: Secondary | ICD-10-CM

## 2021-03-23 DIAGNOSIS — Z885 Allergy status to narcotic agent status: Secondary | ICD-10-CM | POA: Diagnosis not present

## 2021-03-23 DIAGNOSIS — C50411 Malignant neoplasm of upper-outer quadrant of right female breast: Secondary | ICD-10-CM | POA: Diagnosis not present

## 2021-03-23 DIAGNOSIS — R590 Localized enlarged lymph nodes: Secondary | ICD-10-CM | POA: Diagnosis not present

## 2021-03-23 DIAGNOSIS — C50919 Malignant neoplasm of unspecified site of unspecified female breast: Secondary | ICD-10-CM | POA: Diagnosis not present

## 2021-03-23 DIAGNOSIS — Z880 Allergy status to penicillin: Secondary | ICD-10-CM | POA: Diagnosis not present

## 2021-03-23 DIAGNOSIS — Z886 Allergy status to analgesic agent status: Secondary | ICD-10-CM | POA: Diagnosis not present

## 2021-03-23 DIAGNOSIS — Z79811 Long term (current) use of aromatase inhibitors: Secondary | ICD-10-CM | POA: Diagnosis not present

## 2021-03-23 LAB — CBC WITH DIFFERENTIAL (CANCER CENTER ONLY)
Abs Immature Granulocytes: 0.01 10*3/uL (ref 0.00–0.07)
Basophils Absolute: 0.1 10*3/uL (ref 0.0–0.1)
Basophils Relative: 2 %
Eosinophils Absolute: 0 10*3/uL (ref 0.0–0.5)
Eosinophils Relative: 1 %
HCT: 31.8 % — ABNORMAL LOW (ref 36.0–46.0)
Hemoglobin: 10.9 g/dL — ABNORMAL LOW (ref 12.0–15.0)
Immature Granulocytes: 0 %
Lymphocytes Relative: 57 %
Lymphs Abs: 3.1 10*3/uL (ref 0.7–4.0)
MCH: 33.3 pg (ref 26.0–34.0)
MCHC: 34.3 g/dL (ref 30.0–36.0)
MCV: 97.2 fL (ref 80.0–100.0)
Monocytes Absolute: 0.2 10*3/uL (ref 0.1–1.0)
Monocytes Relative: 4 %
Neutro Abs: 2 10*3/uL (ref 1.7–7.7)
Neutrophils Relative %: 36 %
Platelet Count: 220 10*3/uL (ref 150–400)
RBC: 3.27 MIL/uL — ABNORMAL LOW (ref 3.87–5.11)
RDW: 16.3 % — ABNORMAL HIGH (ref 11.5–15.5)
Smear Review: NORMAL
WBC Count: 5.5 10*3/uL (ref 4.0–10.5)
nRBC: 0 % (ref 0.0–0.2)

## 2021-03-23 LAB — CMP (CANCER CENTER ONLY)
ALT: 10 U/L (ref 0–44)
AST: 12 U/L — ABNORMAL LOW (ref 15–41)
Albumin: 4.4 g/dL (ref 3.5–5.0)
Alkaline Phosphatase: 68 U/L (ref 38–126)
Anion gap: 7 (ref 5–15)
BUN: 23 mg/dL (ref 8–23)
CO2: 28 mmol/L (ref 22–32)
Calcium: 9.7 mg/dL (ref 8.9–10.3)
Chloride: 105 mmol/L (ref 98–111)
Creatinine: 1.28 mg/dL — ABNORMAL HIGH (ref 0.44–1.00)
GFR, Estimated: 43 mL/min — ABNORMAL LOW (ref >60)
Glucose, Bld: 118 mg/dL — ABNORMAL HIGH (ref 70–99)
Potassium: 3.8 mmol/L (ref 3.5–5.1)
Sodium: 140 mmol/L (ref 135–145)
Total Bilirubin: 0.4 mg/dL (ref 0.3–1.2)
Total Protein: 6.7 g/dL (ref 6.5–8.1)

## 2021-03-23 NOTE — Assessment & Plan Note (Signed)
H/O?right breast cancer on 2017?and 1999: Lumpectomy.??(Bilateral mastectomies in 2017: Seen by Dr. Lavera Guise, could not tolerate antiestrogen therapy) ?Chest CT on 06/06/19 showed a mass in the right axilla and a mass in the right lower lung lobe. ?PET scan on 07/03/19 showed a right lower lobe pulmonary nodule concerning for malignancy, the right axillary mass consistent with local breast cancer recurrence, and small hypermetabolic right axillary and neck nodes consistent with metastatic adenopathy. ?Biopsy of the right axilla on 07/13/19 showed carcinoma consistent with breast carcinoma, HER-2 negative (1+), ER+ 95%, PR+ 75%, Ki67 25%. ?---------------------------------------------------------------------------------------------------------------------------------------------- ?Current treatment: Anastrozole 1 mg daily started 08/14/2019 ?She is moved?in with her daughter. ?CT Chest 02/20/20:?RLL lung mass is similar in size (3.4 cm), Multiple non specific new lung nodules 5 mm or less, T2 sclerotic lesion same ?CT chest abdomen and pelvis 06/21/2020: Persistent right axillary lymphadenopathy stable. ?Spiculated right lower lobe lung mass, slow growth. ?Minimal interval enlargement of small right lower lobe lung nodule ?CT chest 10/21/2020: Interval enlargement of the spiculated subpleural mass from 3.1 cm to 3.6 cm (? ?Lung primary), bulky right axillary lymph nodes measuring 4 cm (previously 2.5 cm) ?PET CT scan 11/07/2020: Progression of right axillary nodal metastases. ?New hypermetabolic mediastinal and right hilar metastases compared to before. ?Interval increase in the size of the hypermetabolic right lower lobe nodular mass? ?Radiology review:?There is clear-cut progression of disease both in the lung mass as well as the axillary lymph nodes. ?? ?Lung nodule:?Status post bronchoscopy and biopsy December 2022: Adenocarcinoma (did not have enough tissue to run immunohistochemistry) ?I called and discussed the  case with Dr. Melina Copa who will look at the slides once again and see if there is any possibility to do estrogen receptor staining. ?? ?Current treatment:?Ibrance?with anastrozole?(switched from Verzenio because of diarrhea) ?? ?Ibrance toxicities:?Labs have been reviewed. ?Recommended increasing the dosage of Ibrance to 100 mg a day. ?? ?Return to clinic in?1 month for follow-up. ?? ?

## 2021-03-25 ENCOUNTER — Ambulatory Visit: Payer: Medicare HMO | Admitting: Pharmacist

## 2021-03-25 ENCOUNTER — Other Ambulatory Visit: Payer: Medicare HMO

## 2021-04-10 ENCOUNTER — Other Ambulatory Visit (HOSPITAL_COMMUNITY): Payer: Self-pay

## 2021-04-10 ENCOUNTER — Telehealth: Payer: Self-pay

## 2021-04-10 DIAGNOSIS — R112 Nausea with vomiting, unspecified: Secondary | ICD-10-CM

## 2021-04-10 NOTE — Telephone Encounter (Signed)
Oral Oncology Pharmacist Encounter ? ?Patient called in reference to new medications patient is taking and would like to know if there was any interaction with her Ibrance. The two new medications included Zofran for nausea and flexeril. Patient notified there are no interactions with those medications.  ? ?Drema Halon, PharmD ?Hematology/Oncology Clinical Pharmacist ?[facility] ?[phone] ? ?

## 2021-04-13 ENCOUNTER — Other Ambulatory Visit (HOSPITAL_COMMUNITY): Payer: Self-pay

## 2021-04-16 NOTE — Progress Notes (Signed)
? ?Patient Care Team: ?[name], MD as PCP - General (Family Medicine) ? ?DIAGNOSIS:  ?Encounter Diagnosis  ?Name Primary?  ? Malignant neoplasm of upper-outer quadrant of right breast in female, estrogen receptor positive ([place])   ? ? ?SUMMARY OF ONCOLOGIC HISTORY: ?Oncology History  ?Malignant neoplasm of upper-outer quadrant of right breast in female, estrogen receptor positive ([place])  ?07/13/2019 Initial Diagnosis  ? History of right breast cancer on 2017. Chest CT on 06/06/19 showed a mass in the right axilla and a mass in the right lower lung lobe. PET scan on 07/03/19 showed a right lower lobe pulmonary nodule concerning for malignancy, the right axillary mass consistent with local breast cancer recurrence, and small hypermetabolic right axillary and neck nodes consistent with metastatic adenopathy. Biopsy of the right axilla on 07/13/19 showed carcinoma consistent with breast carcinoma, HER-2 negative (1+), ER+ 95%, PR+ 75%, Ki67 25%. ?  ?08/14/2019 -  Anti-estrogen oral therapy  ? Initially anastrozole alone,   Verzenio October 2022 (Verzenio discontinued for diarrhea), switched to [facility] 12/08/2020 ?  ? ? ?CHIEF COMPLIANT: Currently on Verzenio and Anastrozole ? ?INTERVAL HISTORY: Audrey [name] is a  ?81 year old above-mentioned history of metastatic breast cancer. She presents to the clinic today for a follow-up. She complains of chills fever and abdominal pain. She states she has a little diarrhea. ?She complained of diverticulitis. ? ?ALLERGIES:  is allergic to ativan [lorazepam], aspirin, codeine, and penicillins. ? ?MEDICATIONS:  ?Current Outpatient Medications  ?Medication Sig Dispense Refill  ? amLODipine (NORVASC) 2.5 MG tablet Take 2.5 mg by mouth daily.    ? anastrozole (ARIMIDEX) 1 MG tablet Take 1 tablet (1 mg total) by mouth daily. 90 tablet 3  ? cholecalciferol (VITAMIN D3) 25 MCG (1000 UNIT) tablet Take 1,000 Units by mouth daily.    ? cyclobenzaprine (FLEXERIL) 5 MG tablet Take 5 mg by  mouth 3 (three) times daily as needed for muscle spasms.    ? dicyclomine (BENTYL) 20 MG tablet Take 20 mg by mouth 3 (three) times daily as needed for spasms.    ? hyoscyamine (LEVBID) 0.375 MG 12 hr tablet Take 0.375 mg by mouth every 12 (twelve) hours as needed for cramping.    ? ketotifen (ZADITOR) 0.025 % ophthalmic solution Place 1 drop into both eyes 2 (two) times daily as needed (dry eyes).    ? omeprazole (PRILOSEC) 20 MG capsule Take 20 mg by mouth daily as needed (acid reflux).    ? ondansetron (ZOFRAN) 4 MG tablet Take 4 mg by mouth every 8 (eight) hours as needed for nausea or vomiting.    ? palbociclib (IBRANCE) 100 MG tablet Take 1 tablet (100 mg total) by mouth daily. Take for 21 days on, 7 days off, repeat every 28 days. 21 tablet 6  ? polyethylene glycol (MIRALAX / GLYCOLAX) 17 g packet Take 17 g by mouth daily as needed for moderate constipation.    ? ?No current facility-administered medications for this visit.  ? ? ?PHYSICAL EXAMINATION: ?ECOG PERFORMANCE STATUS: 1 - Symptomatic but completely ambulatory ? ?Vitals:  ? 04/28/21 0959  ?BP: (!) 178/58  ?Pulse: 89  ?Resp: 18  ?Temp: 97.9 ?F (36.6 ?C)  ?SpO2: 100%  ? ?Filed Weights  ? 04/28/21 0959  ?Weight: 141 lb 4.8 oz (64.1 kg)  ? ?  ? ?LABORATORY DATA:  ?I have reviewed the data as listed ? ?  Latest Ref Rng & Units 04/28/2021  ?  9:40 AM 03/23/2021  ?  1:43 PM 02/25/2021  ?  10:59 AM  ?CMP  ?Glucose 70 - 99 mg/dL 102   118   90    ?BUN 8 - 23 mg/dL 18   23   24     ?Creatinine 0.44 - 1.00 mg/dL 1.16   1.28   1.19    ?Sodium 135 - 145 mmol/L 139   140   139    ?Potassium 3.5 - 5.1 mmol/L 3.8   3.8   4.4    ?Chloride 98 - 111 mmol/L 107   105   105    ?CO2 22 - 32 mmol/L 25   28   28     ?Calcium 8.9 - 10.3 mg/dL 9.4   9.7   9.9    ?Total Protein 6.5 - 8.1 g/dL 6.9   6.7   6.9    ?Total Bilirubin 0.3 - 1.2 mg/dL 0.3   0.4   0.3    ?Alkaline Phos 38 - 126 U/L 64   68   69    ?AST 15 - 41 U/L 11   12   12     ?ALT 0 - 44 U/L 8   10   8     ? ? ?Lab Results   ?Component Value Date  ? WBC 5.9 04/28/2021  ? HGB 11.0 (L) 04/28/2021  ? HCT 32.4 (L) 04/28/2021  ? MCV 99.7 04/28/2021  ? PLT 244 04/28/2021  ? NEUTROABS 2.6 04/28/2021  ? ? ?ASSESSMENT & PLAN:  ?Malignant neoplasm of upper-outer quadrant of right breast in female, estrogen receptor positive ([place]) ?H/O right breast cancer on 2017 and 1999: Lumpectomy.  (Bilateral mastectomies in 2017: Seen by Dr. Lavera Guise, could not tolerate antiestrogen therapy) ?Chest CT on 06/06/19 showed a mass in the right axilla and a mass in the right lower lung lobe. ?PET scan on 07/03/19 showed a right lower lobe pulmonary nodule concerning for malignancy, the right axillary mass consistent with local breast cancer recurrence, and small hypermetabolic right axillary and neck nodes consistent with metastatic adenopathy. ?Biopsy of the right axilla on 07/13/19 showed carcinoma consistent with breast carcinoma, HER-2 negative (1+), ER+ 95%, PR+ 75%, Ki67 25%. ?---------------------------------------------------------------------------------------------------------------------------------------------- ?Current treatment: Anastrozole 1 mg daily started 08/14/2019 with Audrey [name] ?  ?PET CT scan 11/07/2020: Progression of right axillary nodal metastases.  New hypermetabolic mediastinal and right hilar metastases compared to before.  Interval increase in the size of the hypermetabolic right lower lobe nodular mass  ?Radiology review: There is clear-cut progression of disease both in the lung mass as well as the axillary lymph nodes. ?  ?CT chest 02/03/2021: Enlarging mediastinal lymph nodes compatible with worsening of metastatic disease.  Spiculated right lower lobe lung mass.  Bulky right axillary adenopathy unchanged T2 vertebral body sclerotic lesion ?  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  ?Current treatment: Ibrance started 11/27/20 with  anastrozole (switched from [facility] because of diarrhea) ?  ?Ibrance toxicities: Labs have been reviewed ?1.  Diverticulitis: Patient had to have 2 weeks of diverticulitis but to continue Ibrance until the last few days.  The diarrhea has subsided. ?2. no evidence of leukopenia. ?  ?Return to clinic in 1 month for follow-up after scans. ? ? ? ?Orders Placed This Encounter  ?Procedures  ? CT Chest W Contrast  ?  Standing Status:   Future  ?  Standing Expiration Date:   04/28/2022  ?  Order Specific Question:   If indicated for the ordered  procedure, I authorize the administration of contrast media per Radiology protocol  ?  Answer:   Yes  ?  Order Specific Question:   Preferred imaging location?  ?  Answer:   [facility]  ?  Order Specific Question:   Release to patient  ?  Answer:   Immediate  ? ?The patient has a good understanding of the overall plan. she agrees with it. she will call with any problems that may develop before the next visit here. ?Total time spent: 30 mins including face to face time and time spent for planning, charting and co-ordination of care ? ? Harriette Ohara, MD ?04/28/21 ? ? ? I Gardiner Coins am scribing for Dr. Lindi Adie ? ?I have reviewed the above documentation for accuracy and completeness, and I agree with the above. ?  ?

## 2021-04-22 ENCOUNTER — Other Ambulatory Visit (HOSPITAL_COMMUNITY): Payer: Self-pay

## 2021-04-28 ENCOUNTER — Other Ambulatory Visit: Payer: Self-pay

## 2021-04-28 ENCOUNTER — Inpatient Hospital Stay: Payer: Medicare HMO | Admitting: Hematology and Oncology

## 2021-04-28 ENCOUNTER — Inpatient Hospital Stay: Payer: Medicare HMO | Attending: Hematology and Oncology

## 2021-04-28 DIAGNOSIS — R197 Diarrhea, unspecified: Secondary | ICD-10-CM | POA: Diagnosis not present

## 2021-04-28 DIAGNOSIS — Z886 Allergy status to analgesic agent status: Secondary | ICD-10-CM | POA: Insufficient documentation

## 2021-04-28 DIAGNOSIS — Z880 Allergy status to penicillin: Secondary | ICD-10-CM | POA: Diagnosis not present

## 2021-04-28 DIAGNOSIS — C773 Secondary and unspecified malignant neoplasm of axilla and upper limb lymph nodes: Secondary | ICD-10-CM | POA: Diagnosis not present

## 2021-04-28 DIAGNOSIS — C50919 Malignant neoplasm of unspecified site of unspecified female breast: Secondary | ICD-10-CM

## 2021-04-28 DIAGNOSIS — C7801 Secondary malignant neoplasm of right lung: Secondary | ICD-10-CM | POA: Diagnosis not present

## 2021-04-28 DIAGNOSIS — C50411 Malignant neoplasm of upper-outer quadrant of right female breast: Secondary | ICD-10-CM | POA: Diagnosis present

## 2021-04-28 DIAGNOSIS — R109 Unspecified abdominal pain: Secondary | ICD-10-CM | POA: Insufficient documentation

## 2021-04-28 DIAGNOSIS — R509 Fever, unspecified: Secondary | ICD-10-CM | POA: Diagnosis not present

## 2021-04-28 DIAGNOSIS — Z170 Estrogen receptor positive status [ER+]: Secondary | ICD-10-CM

## 2021-04-28 DIAGNOSIS — Z79899 Other long term (current) drug therapy: Secondary | ICD-10-CM | POA: Insufficient documentation

## 2021-04-28 DIAGNOSIS — Z885 Allergy status to narcotic agent status: Secondary | ICD-10-CM | POA: Insufficient documentation

## 2021-04-28 LAB — CBC WITH DIFFERENTIAL (CANCER CENTER ONLY)
Abs Immature Granulocytes: 0.02 10*3/uL (ref 0.00–0.07)
Basophils Absolute: 0.1 10*3/uL (ref 0.0–0.1)
Basophils Relative: 2 %
Eosinophils Absolute: 0 10*3/uL (ref 0.0–0.5)
Eosinophils Relative: 1 %
HCT: 32.4 % — ABNORMAL LOW (ref 36.0–46.0)
Hemoglobin: 11 g/dL — ABNORMAL LOW (ref 12.0–15.0)
Immature Granulocytes: 0 %
Lymphocytes Relative: 46 %
Lymphs Abs: 2.8 10*3/uL (ref 0.7–4.0)
MCH: 33.8 pg (ref 26.0–34.0)
MCHC: 34 g/dL (ref 30.0–36.0)
MCV: 99.7 fL (ref 80.0–100.0)
Monocytes Absolute: 0.4 10*3/uL (ref 0.1–1.0)
Monocytes Relative: 7 %
Neutro Abs: 2.6 10*3/uL (ref 1.7–7.7)
Neutrophils Relative %: 44 %
Platelet Count: 244 10*3/uL (ref 150–400)
RBC: 3.25 MIL/uL — ABNORMAL LOW (ref 3.87–5.11)
RDW: 15.1 % (ref 11.5–15.5)
WBC Count: 5.9 10*3/uL (ref 4.0–10.5)
nRBC: 0 % (ref 0.0–0.2)

## 2021-04-28 LAB — CMP (CANCER CENTER ONLY)
ALT: 8 U/L (ref 0–44)
AST: 11 U/L — ABNORMAL LOW (ref 15–41)
Albumin: 4.2 g/dL (ref 3.5–5.0)
Alkaline Phosphatase: 64 U/L (ref 38–126)
Anion gap: 7 (ref 5–15)
BUN: 18 mg/dL (ref 8–23)
CO2: 25 mmol/L (ref 22–32)
Calcium: 9.4 mg/dL (ref 8.9–10.3)
Chloride: 107 mmol/L (ref 98–111)
Creatinine: 1.16 mg/dL — ABNORMAL HIGH (ref 0.44–1.00)
GFR, Estimated: 48 mL/min — ABNORMAL LOW (ref >60)
Glucose, Bld: 102 mg/dL — ABNORMAL HIGH (ref 70–99)
Potassium: 3.8 mmol/L (ref 3.5–5.1)
Sodium: 139 mmol/L (ref 135–145)
Total Bilirubin: 0.3 mg/dL (ref 0.3–1.2)
Total Protein: 6.9 g/dL (ref 6.5–8.1)

## 2021-04-28 NOTE — Assessment & Plan Note (Addendum)
H/O?right breast cancer on 2017?and 1999: Lumpectomy.??(Bilateral mastectomies in 2017: Seen by Dr. Lavera Guise, could not tolerate antiestrogen therapy) ?Chest CT on 06/06/19 showed a mass in the right axilla and a mass in the right lower lung lobe. ?PET scan on 07/03/19 showed a right lower lobe pulmonary nodule concerning for malignancy, the right axillary mass consistent with local breast cancer recurrence, and small hypermetabolic right axillary and neck nodes consistent with metastatic adenopathy. ?Biopsy of the right axilla on 07/13/19 showed carcinoma consistent with breast carcinoma, HER-2 negative (1+), ER+ 95%, PR+ 75%, Ki67 25%. ?---------------------------------------------------------------------------------------------------------------------------------------------- ?Current treatment: Anastrozole 1 mg daily started 08/14/2019 with Ibrance ?? ?PET CT scan 11/07/2020: Progression of right axillary nodal metastases. ?New hypermetabolic mediastinal and right hilar metastases compared to before. ?Interval increase in the size of the hypermetabolic right lower lobe nodular mass? ?Radiology review:?There is clear-cut progression of disease both in the lung mass as well as the axillary lymph nodes. ?? ?CT chest 02/03/2021: Enlarging mediastinal lymph nodes compatible with worsening of metastatic disease.  Spiculated right lower lobe lung mass.  Bulky right axillary adenopathy unchanged T2 vertebral body sclerotic lesion ?? 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 ?Current treatment:?Ibrance?started 11/27/20 with anastrozole?(switched from [facility] because of diarrhea) ?? ?Ibrance toxicities:?Labs have been reviewed ?1.  Diverticulitis: Patient had to have 2 weeks of diverticulitis but to continue Ibrance until the last few days.  The diarrhea has subsided. ?2. no evidence of leukopenia. ?? ?Return to clinic in?1  month for follow-up after scans. ? ?

## 2021-04-29 LAB — CANCER ANTIGEN 27.29: CA 27.29: 57.8 U/mL — ABNORMAL HIGH (ref 0.0–38.6)

## 2021-05-05 ENCOUNTER — Other Ambulatory Visit: Payer: Self-pay | Admitting: Hematology and Oncology

## 2021-05-05 NOTE — Progress Notes (Signed)
[id number] molecular testing: PIK 3 CA mutation present, EGFR amplification ? ?MSI high: Not detected ? ?

## 2021-05-11 NOTE — Progress Notes (Signed)
? ?Patient Care Team: ?[name], MD as PCP - General (Family Medicine) ? ?DIAGNOSIS:  ?Encounter Diagnosis  ?Name Primary?  ? Malignant neoplasm of upper-outer quadrant of right breast in female, estrogen receptor positive ([place])   ? ? ?SUMMARY OF ONCOLOGIC HISTORY: ?Oncology History  ?Malignant neoplasm of upper-outer quadrant of right breast in female, estrogen receptor positive ([place])  ?07/13/2019 Initial Diagnosis  ? History of right breast cancer on 2017. Chest CT on 06/06/19 showed a mass in the right axilla and a mass in the right lower lung lobe. PET scan on 07/03/19 showed a right lower lobe pulmonary nodule concerning for malignancy, the right axillary mass consistent with local breast cancer recurrence, and small hypermetabolic right axillary and neck nodes consistent with metastatic adenopathy. Biopsy of the right axilla on 07/13/19 showed carcinoma consistent with breast carcinoma, HER-2 negative (1+), ER+ 95%, PR+ 75%, Ki67 25%. ?  ?08/14/2019 -  Anti-estrogen oral therapy  ? Initially anastrozole alone,   Verzenio October 2022 (Verzenio discontinued for diarrhea), switched to [facility] 12/08/2020 ?  ? ? ?CHIEF COMPLIANT: metastatic breast cancer, follow-up after ct scans  ? ?INTERVAL HISTORY: Audrey [name] is a 81 year old above-mentioned history of metastatic breast cancer. She presents to the clinic today for a follow-up to review ct scans. She states she is having some diarrhea. She states the first 2 weeks she did ok. But the last week it's bad. ? ? ?ALLERGIES:  is allergic to ativan [lorazepam], cefuroxime, aspirin, codeine, and penicillins. ? ?MEDICATIONS:  ?Current Outpatient Medications  ?Medication Sig Dispense Refill  ? amLODipine (NORVASC) 2.5 MG tablet Take 2.5 mg by mouth daily.    ? anastrozole (ARIMIDEX) 1 MG tablet Take 1 tablet (1 mg total) by mouth daily. 90 tablet 3  ? cholecalciferol (VITAMIN D3) 25 MCG (1000 UNIT) tablet Take 1,000 Units by mouth daily.    ? cyclobenzaprine  (FLEXERIL) 5 MG tablet Take 5 mg by mouth 3 (three) times daily as needed for muscle spasms.    ? dicyclomine (BENTYL) 20 MG tablet Take 20 mg by mouth 3 (three) times daily as needed for spasms.    ? hyoscyamine (LEVBID) 0.375 MG 12 hr tablet Take 0.375 mg by mouth every 12 (twelve) hours as needed for cramping.    ? ketotifen (ZADITOR) 0.025 % ophthalmic solution Place 1 drop into both eyes 2 (two) times daily as needed (dry eyes).    ? omeprazole (PRILOSEC) 20 MG capsule Take 20 mg by mouth daily as needed (acid reflux).    ? ondansetron (ZOFRAN) 4 MG tablet Take 4 mg by mouth every 8 (eight) hours as needed for nausea or vomiting.    ? palbociclib (IBRANCE) 75 MG tablet Take 1 tablet (75 mg total) by mouth daily. Take for 21 days on, 7 days off, repeat every 28 days. 21 tablet 3  ? polyethylene glycol (MIRALAX / GLYCOLAX) 17 g packet Take 17 g by mouth daily as needed for moderate constipation.    ? ?No current facility-administered medications for this visit.  ? ? ?PHYSICAL EXAMINATION: ?ECOG PERFORMANCE STATUS: 1 - Symptomatic but completely ambulatory ? ?Vitals:  ? 05/25/21 1121  ?BP: (!) 167/74  ?Pulse: 100  ?Resp: 18  ?Temp: 97.9 ?F (36.6 ?C)  ?SpO2: 100%  ? ?Filed Weights  ? 05/25/21 1121  ?Weight: 143 lb 8 oz (65.1 kg)  ? ?  ? ?LABORATORY DATA:  ?I have reviewed the data as listed ? ?  Latest Ref Rng & Units 04/28/2021  ?  9:40 AM 03/23/2021  ?  1:43 PM 02/25/2021  ? 10:59 AM  ?CMP  ?Glucose 70 - 99 mg/dL 102   118   90    ?BUN 8 - 23 mg/dL 18   23   24     ?Creatinine 0.44 - 1.00 mg/dL 1.16   1.28   1.19    ?Sodium 135 - 145 mmol/L 139   140   139    ?Potassium 3.5 - 5.1 mmol/L 3.8   3.8   4.4    ?Chloride 98 - 111 mmol/L 107   105   105    ?CO2 22 - 32 mmol/L 25   28   28     ?Calcium 8.9 - 10.3 mg/dL 9.4   9.7   9.9    ?Total Protein 6.5 - 8.1 g/dL 6.9   6.7   6.9    ?Total Bilirubin 0.3 - 1.2 mg/dL 0.3   0.4   0.3    ?Alkaline Phos 38 - 126 U/L 64   68   69    ?AST 15 - 41 U/L 11   12   12     ?ALT 0 - 44  U/L 8   10   8     ? ? ?Lab Results  ?Component Value Date  ? WBC 5.9 04/28/2021  ? HGB 11.0 (L) 04/28/2021  ? HCT 32.4 (L) 04/28/2021  ? MCV 99.7 04/28/2021  ? PLT 244 04/28/2021  ? NEUTROABS 2.6 04/28/2021  ? ? ?ASSESSMENT & PLAN:  ?Malignant neoplasm of upper-outer quadrant of right breast in female, estrogen receptor positive ([place]) ?Malignant neoplasm of upper-outer quadrant of right breast in female, estrogen receptor positive ([place]) ?H/O right breast cancer on 2017 and 1999: Lumpectomy.  (Bilateral mastectomies in 2017: Seen by Dr. Lavera Guise, could not tolerate antiestrogen therapy) ?Chest CT on 06/06/19 showed a mass in the right axilla and a mass in the right lower lung lobe. ?PET scan on 07/03/19 showed a right lower lobe pulmonary nodule concerning for malignancy, the right axillary mass consistent with local breast cancer recurrence, and small hypermetabolic right axillary and neck nodes consistent with metastatic adenopathy. ?Biopsy of the right axilla on 07/13/19 showed carcinoma consistent with breast carcinoma, HER-2 negative (1+), ER+ 95%, PR+ 75%, Ki67 25%. ?---------------------------------------------------------------------------------------------------------------------------------------------- ?Current treatment: Anastrozole 1 mg daily started 08/14/2019 with Leslee Home (Verzinio was discontinued because of diarrhea) ?Ibrance toxicities: Labs have been reviewed ?1.  Diverticulitis: Intermittent diarrhea ?2. no evidence of leukopenia. ?3.  Fatigue: She would like to take a lower dosage of Ibrance.  We will reduce the dosage to 75 mg 3 weeks on 1 week off starting 05/25/2021. ? ?05/22/2021: CT chest: Radiology has not read the CT scan yet.  To my review the lung nodule appears to be smaller.  I will call her with the results of the CT scan. ? ?Return to clinic in 2 months with labs and follow-up. ? ?No orders of the defined types were placed in this encounter. ? ?The patient has a good understanding of  the overall plan. she agrees with it. she will call with any problems that may develop before the next visit here. ?Total time spent: 30 mins including face to face time and time spent for planning, charting and co-ordination of care ? ? Harriette Ohara, MD ?05/25/21 ? ? ? I Gardiner Coins am scribing for Dr. Lindi Adie ? ?I have reviewed the above documentation for accuracy and completeness, and I agree with the above. ?  ?

## 2021-05-14 ENCOUNTER — Other Ambulatory Visit (HOSPITAL_COMMUNITY): Payer: Self-pay

## 2021-05-15 ENCOUNTER — Other Ambulatory Visit (HOSPITAL_COMMUNITY): Payer: Self-pay

## 2021-05-15 LAB — GUARDANT 360

## 2021-05-21 ENCOUNTER — Other Ambulatory Visit (HOSPITAL_COMMUNITY): Payer: Self-pay

## 2021-05-22 ENCOUNTER — Ambulatory Visit (HOSPITAL_COMMUNITY)
Admission: RE | Admit: 2021-05-22 | Discharge: 2021-05-22 | Disposition: A | Discharge status: Home / Self Care | Payer: Medicare HMO | Source: Ambulatory Visit | Attending: Hematology and Oncology | Admitting: Hematology and Oncology

## 2021-05-22 DIAGNOSIS — C50411 Malignant neoplasm of upper-outer quadrant of right female breast: Secondary | ICD-10-CM | POA: Insufficient documentation

## 2021-05-22 DIAGNOSIS — Z170 Estrogen receptor positive status [ER+]: Secondary | ICD-10-CM | POA: Insufficient documentation

## 2021-05-22 MED ORDER — SODIUM CHLORIDE (PF) 0.9 % IJ SOLN
INTRAMUSCULAR | Status: AC
  Filled 2021-05-22: qty 50

## 2021-05-22 MED ORDER — IOHEXOL 300 MG/ML  SOLN
75.0000 mL | Freq: Once | INTRAMUSCULAR | Status: AC | PRN
  Administered 2021-05-22: 75 mL via INTRAVENOUS

## 2021-05-25 ENCOUNTER — Inpatient Hospital Stay: Payer: Medicare HMO | Attending: Hematology and Oncology | Admitting: Hematology and Oncology

## 2021-05-25 ENCOUNTER — Other Ambulatory Visit: Payer: Self-pay

## 2021-05-25 ENCOUNTER — Other Ambulatory Visit (HOSPITAL_COMMUNITY): Payer: Self-pay

## 2021-05-25 DIAGNOSIS — Z79899 Other long term (current) drug therapy: Secondary | ICD-10-CM | POA: Diagnosis not present

## 2021-05-25 DIAGNOSIS — C778 Secondary and unspecified malignant neoplasm of lymph nodes of multiple regions: Secondary | ICD-10-CM | POA: Insufficient documentation

## 2021-05-25 DIAGNOSIS — C7801 Secondary malignant neoplasm of right lung: Secondary | ICD-10-CM | POA: Diagnosis not present

## 2021-05-25 DIAGNOSIS — Z881 Allergy status to other antibiotic agents status: Secondary | ICD-10-CM | POA: Diagnosis not present

## 2021-05-25 DIAGNOSIS — Z885 Allergy status to narcotic agent status: Secondary | ICD-10-CM | POA: Diagnosis not present

## 2021-05-25 DIAGNOSIS — Z880 Allergy status to penicillin: Secondary | ICD-10-CM | POA: Diagnosis not present

## 2021-05-25 DIAGNOSIS — C50411 Malignant neoplasm of upper-outer quadrant of right female breast: Secondary | ICD-10-CM | POA: Insufficient documentation

## 2021-05-25 DIAGNOSIS — R197 Diarrhea, unspecified: Secondary | ICD-10-CM | POA: Diagnosis not present

## 2021-05-25 DIAGNOSIS — Z886 Allergy status to analgesic agent status: Secondary | ICD-10-CM | POA: Diagnosis not present

## 2021-05-25 DIAGNOSIS — Z170 Estrogen receptor positive status [ER+]: Secondary | ICD-10-CM | POA: Diagnosis not present

## 2021-05-25 MED ORDER — PALBOCICLIB 75 MG PO TABS
75.0000 mg | ORAL_TABLET | Freq: Every day | ORAL | 3 refills | Status: DC
  Filled 2021-05-25: qty 21, 28d supply, fill #0
  Filled 2021-06-16: qty 21, 28d supply, fill #1
  Filled 2021-07-09: qty 21, 28d supply, fill #2
  Filled 2021-08-06: qty 21, 28d supply, fill #3

## 2021-05-25 NOTE — Assessment & Plan Note (Signed)
Malignant neoplasm of upper-outer quadrant of right breast in female, estrogen receptor positive ([place]) ?H/O?right breast cancer on 2017?and 1999: Lumpectomy.??(Bilateral mastectomies in 2017: Seen by Dr. Lavera Guise, could not tolerate antiestrogen therapy) ?Chest CT on 06/06/19 showed a mass in the right axilla and a mass in the right lower lung lobe. ?PET scan on 07/03/19 showed a right lower lobe pulmonary nodule concerning for malignancy, the right axillary mass consistent with local breast cancer recurrence, and small hypermetabolic right axillary and neck nodes consistent with metastatic adenopathy. ?Biopsy of the right axilla on 07/13/19 showed carcinoma consistent with breast carcinoma, HER-2 negative (1+), ER+ 95%, PR+ 75%, Ki67 25%. ?---------------------------------------------------------------------------------------------------------------------------------------------- ?Current treatment: Anastrozole 1 mg daily started 08/14/2019?with Leslee Home (Verzinio was discontinued because of diarrhea) ?Ibrance toxicities:?Labs have been reviewed ?1.  Diverticulitis: Patient had to have 2 weeks of diverticulitis but to continue Ibrance until the last few days.  The diarrhea has subsided. ?2. no evidence of leukopenia. ? ?05/22/2021: CT chest: ?

## 2021-06-01 ENCOUNTER — Other Ambulatory Visit (HOSPITAL_COMMUNITY): Payer: Self-pay

## 2021-06-03 ENCOUNTER — Other Ambulatory Visit: Payer: Self-pay | Admitting: Hematology and Oncology

## 2021-06-16 ENCOUNTER — Other Ambulatory Visit (HOSPITAL_COMMUNITY): Payer: Self-pay

## 2021-06-18 ENCOUNTER — Other Ambulatory Visit (HOSPITAL_COMMUNITY): Payer: Self-pay

## 2021-07-09 ENCOUNTER — Other Ambulatory Visit (HOSPITAL_COMMUNITY): Payer: Self-pay

## 2021-07-14 NOTE — Progress Notes (Signed)
Patient Care Team: [name], MD as PCP - General (Family Medicine)  DIAGNOSIS:  Encounter Diagnosis  Name Primary?   Malignant neoplasm of upper-outer quadrant of right breast in female, estrogen receptor positive ([place]) Yes    SUMMARY OF ONCOLOGIC HISTORY: Oncology History  Malignant neoplasm of upper-outer quadrant of right breast in female, estrogen receptor positive ([place])  07/13/2019 Initial Diagnosis   History of right breast cancer on 2017. Chest CT on 06/06/19 showed a mass in the right axilla and a mass in the right lower lung lobe. PET scan on 07/03/19 showed a right lower lobe pulmonary nodule concerning for malignancy, the right axillary mass consistent with local breast cancer recurrence, and small hypermetabolic right axillary and neck nodes consistent with metastatic adenopathy. Biopsy of the right axilla on 07/13/19 showed carcinoma consistent with breast carcinoma, HER-2 negative (1+), ER+ 95%, PR+ 75%, Ki67 25%.   08/14/2019 -  Anti-estrogen oral therapy   Initially anastrozole alone,   Verzenio October 2022 (Verzenio discontinued for diarrhea), switched to [facility] 12/08/2020     CHIEF COMPLIANT:  metastatic breast cancer on Ibrance and anastrozole  INTERVAL HISTORY: Audrey [name] is a 81 year old above-mentioned history of metastatic breast cancer on anastrozole. She presents to the clinic today for a follow-up. She states that she doesn't have any energy but it was some improvement. Denies diarrhea. Appetite comes and goes but weight remains the same. She had some swelling in the right arm and it's painful. Complains of the sleeve is aggravating, it's hot and itchy but she can tolerate it. She is tolerating treatment very well.   ALLERGIES:  is allergic to ativan [lorazepam], cefuroxime, aspirin, codeine, and penicillins.  MEDICATIONS:  Current Outpatient Medications  Medication Sig Dispense Refill   albuterol (VENTOLIN HFA) 108 (90 Base) MCG/ACT inhaler 2  inhalations every 4-6 hours as needed for cough wheezing and shortness of breath     amLODipine (NORVASC) 2.5 MG tablet Take 2.5 mg by mouth daily.     anastrozole (ARIMIDEX) 1 MG tablet TAKE 1 TABLET EVERY DAY 90 tablet 3   cholecalciferol (VITAMIN D3) 25 MCG (1000 UNIT) tablet Take 1,000 Units by mouth daily.     cyclobenzaprine (FLEXERIL) 5 MG tablet Take 5 mg by mouth 3 (three) times daily as needed for muscle spasms.     dicyclomine (BENTYL) 20 MG tablet Take 20 mg by mouth 3 (three) times daily as needed for spasms.     hyoscyamine (LEVBID) 0.375 MG 12 hr tablet Take 0.375 mg by mouth every 12 (twelve) hours as needed for cramping.     ketotifen (ZADITOR) 0.025 % ophthalmic solution Place 1 drop into both eyes 2 (two) times daily as needed (dry eyes).     omeprazole (PRILOSEC) 20 MG capsule Take 20 mg by mouth daily as needed (acid reflux).     ondansetron (ZOFRAN) 4 MG tablet Take 4 mg by mouth every 8 (eight) hours as needed for nausea or vomiting.     palbociclib (IBRANCE) 75 MG tablet Take 1 tablet (75 mg total) by mouth daily. Take for 21 days on, 7 days off, repeat every 28 days. 21 tablet 3   polyethylene glycol (MIRALAX / GLYCOLAX) 17 g packet Take 17 g by mouth daily as needed for moderate constipation.     No current facility-administered medications for this visit.    PHYSICAL EXAMINATION: ECOG PERFORMANCE STATUS: 1 - Symptomatic but completely ambulatory  Vitals:   07/27/21 1129  BP: (!) 155/62  Pulse:  84  Resp: 18  Temp: 97.8 F (36.6 C)  SpO2: 99%   Filed Weights   07/27/21 1129  Weight: 141 lb 6.4 oz (64.1 kg)   LABORATORY DATA:  I have reviewed the data as listed    Latest Ref Rng & Units 07/27/2021   10:47 AM 04/28/2021    9:40 AM 03/23/2021    1:43 PM  CMP  Glucose 70 - 99 mg/dL 107  102  118   BUN 8 - 23 mg/dL _0 Creatinine 0.44 - 1.00 mg/dL 1.35  1.16  1.28   Sodium 135 - 145 mmol/L 138  139  140   Potassium 3.5 - 5.1 mmol/L 3.9  3.8  3.8    Chloride 98 - 111 mmol/L 106  107  105   CO2 22 - 32 mmol/L _1 Calcium 8.9 - 10.3 mg/dL 9.7  9.4  9.7   Total Protein 6.5 - 8.1 g/dL 7.1  6.9  6.7   Total Bilirubin 0.3 - 1.2 mg/dL 0.3  0.3  0.4   Alkaline Phos 38 - 126 U/L 72  64  68   AST 15 - 41 U/L _2 ALT 0 - 44 U/L _3 Lab Results  Component Value Date   WBC 5.9 07/27/2021   HGB 11.4 (L) 07/27/2021   HCT 33.3 (L) 07/27/2021   MCV 100.3 (H) 07/27/2021   PLT 347 07/27/2021   NEUTROABS 3.3 07/27/2021    ASSESSMENT & PLAN:  Malignant neoplasm of upper-outer quadrant of right breast in female, estrogen receptor positive ([place]) H/O right breast cancer on 2017 and 1999: Lumpectomy.  (Bilateral mastectomies in 2017: Seen by Dr. Lavera Guise, could not tolerate antiestrogen therapy) Chest CT on 06/06/19 showed a mass in the right axilla and a mass in the right lower lung lobe. PET scan on 07/03/19 showed a right lower lobe pulmonary nodule concerning for malignancy, the right axillary mass consistent with local breast cancer recurrence, and small hypermetabolic right axillary and neck nodes consistent with metastatic adenopathy. Biopsy of the right axilla on 07/13/19 showed carcinoma consistent with breast carcinoma, HER-2 negative (1+), ER+ 95%, PR+ 75%, Ki67 25%. ---------------------------------------------------------------------------------------------------------------------------------------------- Current treatment: Anastrozole 1 mg daily started 08/14/2019 with Audrey [name] (Verzinio was discontinued because of diarrhea) Ibrance toxicities: Labs have been reviewed 1.  Diverticulitis: Intermittent diarrhea 2. no evidence of leukopenia. 3.  Fatigue: She would like to take a lower dosage of Ibrance.  We will reduce the dosage to 75 mg 3 weeks on 1 week off starting 05/25/2021.   05/22/2021: Mild to moderate improvement in thoracic adenopathy compared to prior.  Previously hypermetabolic right lower lobe mass is  minimally smaller, small sclerotic lesion T2 vertebral body stable  Right arm swelling: No evidence of DVT by ultrasound done today.  She is going through lymphedema physical therapy but she does not want to go to the facility and wants to do it on her own.  Return to clinic in 3 months with labs and follow-up.    Orders Placed This Encounter  Procedures   CA 27.29    Standing Status:   Standing    Number of Occurrences:   30    Standing Expiration Date:   07/28/2022   The patient has a good understanding of the overall plan. she agrees with it. she will call with any problems that may develop  before the next visit here. Total time spent: 30 mins including face to face time and time spent for planning, charting and co-ordination of care   Harriette Ohara, MD 07/27/21    I Gardiner Coins am scribing for Dr. Lindi Adie  I have reviewed the above documentation for accuracy and completeness, and I agree with the above.

## 2021-07-20 ENCOUNTER — Inpatient Hospital Stay: Payer: Medicare HMO

## 2021-07-20 ENCOUNTER — Telehealth: Payer: Self-pay

## 2021-07-20 ENCOUNTER — Inpatient Hospital Stay: Payer: Medicare HMO | Attending: Hematology and Oncology | Admitting: Physician Assistant

## 2021-07-20 ENCOUNTER — Telehealth: Payer: Self-pay | Admitting: *Deleted

## 2021-07-20 ENCOUNTER — Other Ambulatory Visit: Payer: Self-pay

## 2021-07-20 VITALS — BP 147/63 | HR 89 | Temp 97.8°F | Resp 17 | Wt 141.8 lb

## 2021-07-20 DIAGNOSIS — R5383 Other fatigue: Secondary | ICD-10-CM | POA: Insufficient documentation

## 2021-07-20 DIAGNOSIS — Z8616 Personal history of COVID-19: Secondary | ICD-10-CM | POA: Insufficient documentation

## 2021-07-20 DIAGNOSIS — Z9049 Acquired absence of other specified parts of digestive tract: Secondary | ICD-10-CM | POA: Diagnosis not present

## 2021-07-20 DIAGNOSIS — M79601 Pain in right arm: Secondary | ICD-10-CM | POA: Diagnosis not present

## 2021-07-20 DIAGNOSIS — Z170 Estrogen receptor positive status [ER+]: Secondary | ICD-10-CM | POA: Diagnosis not present

## 2021-07-20 DIAGNOSIS — R918 Other nonspecific abnormal finding of lung field: Secondary | ICD-10-CM | POA: Diagnosis not present

## 2021-07-20 DIAGNOSIS — M7989 Other specified soft tissue disorders: Secondary | ICD-10-CM | POA: Diagnosis not present

## 2021-07-20 DIAGNOSIS — C50411 Malignant neoplasm of upper-outer quadrant of right female breast: Secondary | ICD-10-CM | POA: Diagnosis not present

## 2021-07-20 DIAGNOSIS — Z79899 Other long term (current) drug therapy: Secondary | ICD-10-CM | POA: Insufficient documentation

## 2021-07-20 DIAGNOSIS — Z79811 Long term (current) use of aromatase inhibitors: Secondary | ICD-10-CM | POA: Diagnosis not present

## 2021-07-20 DIAGNOSIS — M79629 Pain in unspecified upper arm: Secondary | ICD-10-CM | POA: Insufficient documentation

## 2021-07-20 DIAGNOSIS — F17210 Nicotine dependence, cigarettes, uncomplicated: Secondary | ICD-10-CM | POA: Diagnosis not present

## 2021-07-20 DIAGNOSIS — Z87891 Personal history of nicotine dependence: Secondary | ICD-10-CM | POA: Insufficient documentation

## 2021-07-20 LAB — CBC WITH DIFFERENTIAL (CANCER CENTER ONLY)
Abs Immature Granulocytes: 0.03 10*3/uL (ref 0.00–0.07)
Basophils Absolute: 0.1 10*3/uL (ref 0.0–0.1)
Basophils Relative: 1 %
Eosinophils Absolute: 0 10*3/uL (ref 0.0–0.5)
Eosinophils Relative: 1 %
HCT: 32.8 % — ABNORMAL LOW (ref 36.0–46.0)
Hemoglobin: 11.4 g/dL — ABNORMAL LOW (ref 12.0–15.0)
Immature Granulocytes: 1 %
Lymphocytes Relative: 47 %
Lymphs Abs: 3 10*3/uL (ref 0.7–4.0)
MCH: 34 pg (ref 26.0–34.0)
MCHC: 34.8 g/dL (ref 30.0–36.0)
MCV: 97.9 fL (ref 80.0–100.0)
Monocytes Absolute: 0.5 10*3/uL (ref 0.1–1.0)
Monocytes Relative: 8 %
Neutro Abs: 2.7 10*3/uL (ref 1.7–7.7)
Neutrophils Relative %: 42 %
Platelet Count: 252 10*3/uL (ref 150–400)
RBC: 3.35 MIL/uL — ABNORMAL LOW (ref 3.87–5.11)
RDW: 14.3 % (ref 11.5–15.5)
WBC Count: 6.4 10*3/uL (ref 4.0–10.5)
nRBC: 0 % (ref 0.0–0.2)

## 2021-07-20 LAB — SAMPLE TO BLOOD BANK

## 2021-07-20 NOTE — Progress Notes (Signed)
Symptom Management Consult note [place]    Patient Care Team: [name], MD as PCP - General (Family Medicine)    Name of the patient: Audrey [name]  [id number]  1940-06-14   Date of visit: 07/20/2021    Chief complaint/ Reason for visit- right arm pain, mouth pain  Oncology History  Malignant neoplasm of upper-outer quadrant of right breast in female, estrogen receptor positive ([place])  07/13/2019 Initial Diagnosis   History of right breast cancer on 2017. Chest CT on 06/06/19 showed a mass in the right axilla and a mass in the right lower lung lobe. PET scan on 07/03/19 showed a right lower lobe pulmonary nodule concerning for malignancy, the right axillary mass consistent with local breast cancer recurrence, and small hypermetabolic right axillary and neck nodes consistent with metastatic adenopathy. Biopsy of the right axilla on 07/13/19 showed carcinoma consistent with breast carcinoma, HER-2 negative (1+), ER+ 95%, PR+ 75%, Ki67 25%.   08/14/2019 -  Anti-estrogen oral therapy   Initially anastrozole alone,   Verzenio October 2022 (Verzenio discontinued for diarrhea), switched to [facility] 12/08/2020     Current Therapy: Anastrozole 1 mg daily started 08/14/2019 with Leslee Home  Interval history- Audrey [name] is a 81 y.o. with oncologic history as above presenting to [facility] today with chief complaint of right arm pain times approximately 2 weeks and mouth pain x1 week.  Patient has had intermittent pain in her right arm that she describes as an aching sensation.  She compares it to the pain of a toothache.  Pain is located in her upper arm.  It does not radiate.  She states when the pain is present it is 10 out of 10 in severity.  She has also been having intermittent swelling of her right arm.  She denies any injury or trauma to her arm.  She is right-hand dominant.  She tried taking Tylenol without symptom improvement although reported that taking Aleve did help take  the edge off the pain.  She reports having what feels like a sore on the roof of her mouth.  Denies history of similar pain.  She describes that pain as burning.  She has tried swishing with baking soda, warm water and Biotene mouthwash.  She feels like these treatments are helping the pain. She is tolerating p.o. intake without difficulty.  She denies any dental pain.  Patient also reports feeling fatigued for the last month.  She feels like she has no energy.  She will have intermittent shortness of breath with exertion, none recently however.  She denies any fever, chills, diaphoresis, chest pain, palpitations, syncope, numbness, tingling, weakness.  She denies any history of blood clots.      ROS  All other systems are reviewed and are negative for acute change except as noted in the HPI.    Allergies  Allergen Reactions   Ativan [Lorazepam] Itching   Cefuroxime Nausea And Vomiting   Aspirin Other (See Comments)    Stomach pain   Codeine Nausea And Vomiting   Penicillins Rash     Past Medical History:  Diagnosis Date   Anxiety    Arthritis    "hands" (02/25/2014)   Borderline type 2 diabetes mellitus    Breast cancer, right breast ([place]) 1999   recurrence 08/2017   COVID    March 2021 and early 2022   GERD (gastroesophageal reflux disease)    High cholesterol    "can't take the RX; makes my  bones ache" (02/25/2014)   History of hiatal hernia    History of stomach ulcers    Hypertension    Migraines    hx   Pneumonia    PONV (postoperative nausea and vomiting)    at last colonoscopy she was told she was hard to awaken   Shortness of breath dyspnea    w/ exertion      Past Surgical History:  Procedure Laterality Date   APPENDECTOMY     BREAST BIOPSY Right 1999   BREAST LUMPECTOMY Right 1999   BRONCHIAL BIOPSY  12/29/2020   Procedure: BRONCHIAL BIOPSIES;  Surgeon: Collene Gobble, MD;  Location: [facility] ENDOSCOPY;  Service: Pulmonary;;   BRONCHIAL BRUSHINGS  12/29/2020    Procedure: BRONCHIAL BRUSHINGS;  Surgeon: Collene Gobble, MD;  Location: [facility] ENDOSCOPY;  Service: Pulmonary;;   BRONCHIAL NEEDLE ASPIRATION BIOPSY  12/29/2020   Procedure: BRONCHIAL NEEDLE ASPIRATION BIOPSIES;  Surgeon: Collene Gobble, MD;  Location: [place];  Service: Pulmonary;;   BRONCHIAL WASHINGS  12/29/2020   Procedure: BRONCHIAL WASHINGS;  Surgeon: Collene Gobble, MD;  Location: [facility] ENDOSCOPY;  Service: Pulmonary;;   DILATION AND CURETTAGE OF UTERUS     FINE NEEDLE ASPIRATION  12/29/2020   Procedure: FINE NEEDLE ASPIRATION (FNA) LINEAR;  Surgeon: Collene Gobble, MD;  Location: [facility] ENDOSCOPY;  Service: Pulmonary;;   PARTIAL KNEE ARTHROPLASTY Left 02/25/2014   Procedure: LEFT UNICOMPARTMENTAL (medial compartment) KNEE;  Surgeon: Vickey Huger, MD;  Location: [place];  Service: Orthopedics;  Laterality: Left;   REPLACEMENT UNICONDYLAR JOINT KNEE Left 02/25/2014   TONSILLECTOMY     VAGINAL HYSTERECTOMY     VIDEO BRONCHOSCOPY WITH ENDOBRONCHIAL ULTRASOUND N/A 12/29/2020   Procedure: VIDEO BRONCHOSCOPY WITH ENDOBRONCHIAL ULTRASOUND;  Surgeon: Collene Gobble, MD;  Location: [place] ENDOSCOPY;  Service: Pulmonary;  Laterality: N/A;   VIDEO BRONCHOSCOPY WITH RADIAL ENDOBRONCHIAL ULTRASOUND  12/29/2020   Procedure: RADIAL ENDOBRONCHIAL ULTRASOUND;  Surgeon: Collene Gobble, MD;  Location: [facility] ENDOSCOPY;  Service: Pulmonary;;    Social History   Socioeconomic History   Marital status: Widowed    Spouse name: Not on file   Number of children: Not on file   Years of education: Not on file   Highest education level: Not on file  Occupational History   Not on file  Tobacco Use   Smoking status: Some Days    Packs/day: 0.75    Years: 10.00    Total pack years: 7.50    Types: Cigarettes    Start date: 11/05/2002    Last attempt to quit: 02/12/2011    Years since quitting: 10.4   Smokeless tobacco: Never   Tobacco comments:    Smokes 7 - 10 cigarettes a day [facility] 12/18/20  Substance and Sexual  Activity   Alcohol use: No   Drug use: No   Sexual activity: Never  Other Topics Concern   Not on file  Social History Narrative   Not on file   Social Determinants of Health   Financial Resource Strain: Not on file  Food Insecurity: Not on file  Transportation Needs: Not on file  Physical Activity: Not on file  Stress: Not on file  Social Connections: Not on file  Intimate Partner Violence: Not on file    No family history on file.   Current Outpatient Medications:    amLODipine (NORVASC) 2.5 MG tablet, Take 2.5 mg by mouth daily., Disp: , Rfl:    anastrozole (ARIMIDEX) 1 MG tablet, TAKE 1 TABLET EVERY  DAY, Disp: 90 tablet, Rfl: 3   cholecalciferol (VITAMIN D3) 25 MCG (1000 UNIT) tablet, Take 1,000 Units by mouth daily., Disp: , Rfl:    cyclobenzaprine (FLEXERIL) 5 MG tablet, Take 5 mg by mouth 3 (three) times daily as needed for muscle spasms., Disp: , Rfl:    dicyclomine (BENTYL) 20 MG tablet, Take 20 mg by mouth 3 (three) times daily as needed for spasms., Disp: , Rfl:    hyoscyamine (LEVBID) 0.375 MG 12 hr tablet, Take 0.375 mg by mouth every 12 (twelve) hours as needed for cramping., Disp: , Rfl:    ketotifen (ZADITOR) 0.025 % ophthalmic solution, Place 1 drop into both eyes 2 (two) times daily as needed (dry eyes)., Disp: , Rfl:    omeprazole (PRILOSEC) 20 MG capsule, Take 20 mg by mouth daily as needed (acid reflux)., Disp: , Rfl:    ondansetron (ZOFRAN) 4 MG tablet, Take 4 mg by mouth every 8 (eight) hours as needed for nausea or vomiting., Disp: , Rfl:    palbociclib (IBRANCE) 75 MG tablet, Take 1 tablet (75 mg total) by mouth daily. Take for 21 days on, 7 days off, repeat every 28 days., Disp: 21 tablet, Rfl: 3   polyethylene glycol (MIRALAX / GLYCOLAX) 17 g packet, Take 17 g by mouth daily as needed for moderate constipation., Disp: , Rfl:   PHYSICAL EXAM: ECOG FS:1 - Symptomatic but completely ambulatory    Vitals:   07/20/21 1203  BP: (!) 147/63  Pulse: 89   Resp: 17  Temp: 97.8 F (36.6 C)  TempSrc: Oral  SpO2: 100%  Weight: 141 lb 12.8 oz (64.3 kg)   Physical Exam Vitals and nursing note reviewed.  Constitutional:      Appearance: She is well-developed. She is not ill-appearing or toxic-appearing.  HENT:     Head: Normocephalic and atraumatic.     Nose: Nose normal.     Mouth/Throat:   Eyes:     General: No scleral icterus.       Right eye: No discharge.        Left eye: No discharge.     Conjunctiva/sclera: Conjunctivae normal.  Neck:     Vascular: No JVD.  Cardiovascular:     Rate and Rhythm: Normal rate and regular rhythm.     Pulses: Normal pulses.          Radial pulses are 2+ on the right side and 2+ on the left side.     Heart sounds: Normal heart sounds.  Pulmonary:     Effort: Pulmonary effort is normal.     Breath sounds: Normal breath sounds.  Abdominal:     General: There is no distension.  Musculoskeletal:        General: Normal range of motion.     Cervical back: Normal range of motion.     Right lower leg: No edema.     Left lower leg: No edema.     Comments: Minimal swelling noted to right upper arm.  No tenderness to palpation.  No overlying skin changes.  Compartments are soft in right upper extremity.  She has full range of motion of right upper extremity.  No palpable cords.  Skin:    General: Skin is warm and dry.     Comments: Equal tactile temperature in bilateral upper extremities.  Neurological:     Mental Status: She is oriented to person, place, and time.     GCS: GCS eye subscore is 4. GCS verbal subscore  is 5. GCS motor subscore is 6.     Comments: Fluent speech, no facial droop. Strong equal grip strength in bilateral upper extremities  Psychiatric:        Behavior: Behavior normal.        LABORATORY DATA: I have reviewed the data as listed    Latest Ref Rng & Units 07/20/2021    1:40 PM 04/28/2021    9:40 AM 03/23/2021    1:43 PM  CBC  WBC 4.0 - 10.5 K/uL 6.4  5.9  5.5    Hemoglobin 12.0 - 15.0 g/dL 11.4  11.0  10.9   Hematocrit 36.0 - 46.0 % 32.8  32.4  31.8   Platelets 150 - 400 K/uL 252  244  220         Latest Ref Rng & Units 04/28/2021    9:40 AM 03/23/2021    1:43 PM 02/25/2021   10:59 AM  CMP  Glucose 70 - 99 mg/dL 102  118  90   BUN 8 - 23 mg/dL 18  23  24    Creatinine 0.44 - 1.00 mg/dL 1.16  1.28  1.19   Sodium 135 - 145 mmol/L 139  140  139   Potassium 3.5 - 5.1 mmol/L 3.8  3.8  4.4   Chloride 98 - 111 mmol/L 107  105  105   CO2 22 - 32 mmol/L 25  28  28    Calcium 8.9 - 10.3 mg/dL 9.4  9.7  9.9   Total Protein 6.5 - 8.1 g/dL 6.9  6.7  6.9   Total Bilirubin 0.3 - 1.2 mg/dL 0.3  0.4  0.3   Alkaline Phos 38 - 126 U/L 64  68  69   AST 15 - 41 U/L 11  12  12    ALT 0 - 44 U/L 8  10  8         RADIOGRAPHIC STUDIES: I have personally reviewed the radiological images as listed and agreed with the findings in the report. No images are attached to the encounter. No results found.   ASSESSMENT & PLAN: Patient is a 81 y.o. female  with oncologic history of malignant neoplasm of upper-outer quadrant of right breast, ER positive followed by Dr. Lindi Adie.  I have viewed most recent oncology note and lab work.   #) Right arm swelling and pain-patient is well-appearing in no acute distress.  She is hemodynamically stable.  History includes right lumpectomy.  She reports swelling is intermittent and is very mild on exam today.  DDx includes lymphedema, superficial venous thrombosis, deep vein thrombosis.  Engaged in shared decision making with patient.  She is agreeable to have evaluation at the lymphedema clinic.  Patient would like to hold off on any ultrasound of the extremity as clot is less likely based on HPI and exam today.  With patient's complaint of fatigue and intermittent exertional dyspnea CBC was checked today and shows hemoglobin consistent with her baseline.  Patient will follow-up with PCP if fatigue continues.  Patient aware we cannot rule  out a blood clot without further imaging or work-up and she is asking to hold off on this at this time.  I feel this is reasonable.  I did discuss if symptoms were to worsen patient would need to seek evaluation in the emergency department.  #) Mouth pain-exam is consistent with a canker sore.  No signs of infection.  No dental abscess.  Discussed over-the-counter symptom management.  #)malignant neoplasm of upper-outer quadrant of  right breast, ER positive - Next appointment with oncologist is 07/27/21.     Visit Diagnosis: 1. Malignant neoplasm of upper-outer quadrant of right breast in female, estrogen receptor positive ([place])   2. Lung mass   3. Pain of right upper extremity      Orders Placed This Encounter  Procedures   CBC with Differential ([facility] Only)    Standing Status:   Standing    Number of Occurrences:   1    Standing Expiration Date:   07/21/2022   Ambulatory referral to Physical Therapy    Referral Priority:   Routine    Referral Type:   Physical Medicine    Referral Reason:   Specialty Services Required    Requested Specialty:   Physical Therapy    Number of Visits Requested:   1   Sample to Blood Bank    Standing Status:   Standing    Number of Occurrences:   1    Standing Expiration Date:   07/21/2022    All questions were answered. The patient knows to call the clinic with any problems, questions or concerns. No barriers to learning was detected.  I have spent a total of 20 minutes minutes of face-to-face and non-face-to-face time, preparing to see the patient, obtaining and/or reviewing separately obtained history, performing a medically appropriate examination, counseling and educating the patient, ordering tests,  documenting clinical information in the electronic health record, and care coordination.     Thank you for allowing me to participate in the care of this patient.    Barrie Folk, PA-C Department of Hematology/Oncology [facility] Phone: [phone]  [phone]    07/20/2021 5:18 PM

## 2021-07-20 NOTE — Telephone Encounter (Signed)
Received call from pt with complaint of ongoing mouth sores and right arm swelling.  Pt denies recent injury or trauma to right arm.  Per MD pt needing to be seen in [facility] for further evaluation.  Appt scheduled and pt verbalized understanding of appt date and time.

## 2021-07-20 NOTE — Telephone Encounter (Signed)
This RN called patient to inform her of normal CBC result and to tell her the lymphedema clinic will be calling her. Patient verbalized understanding that lymphedema US needs to be scheduled prior to her appointment next week with Dr. Lindi Adie. Patient was encouraged to call [facility] with any further questions or concerns.

## 2021-07-22 ENCOUNTER — Ambulatory Visit: Payer: Medicare HMO | Attending: Hematology and Oncology | Admitting: Physical Therapy

## 2021-07-22 ENCOUNTER — Other Ambulatory Visit: Payer: Self-pay

## 2021-07-22 ENCOUNTER — Encounter: Payer: Self-pay | Admitting: Physical Therapy

## 2021-07-22 DIAGNOSIS — I890 Lymphedema, not elsewhere classified: Secondary | ICD-10-CM | POA: Diagnosis not present

## 2021-07-22 DIAGNOSIS — C50411 Malignant neoplasm of upper-outer quadrant of right female breast: Secondary | ICD-10-CM | POA: Diagnosis present

## 2021-07-22 DIAGNOSIS — Z170 Estrogen receptor positive status [ER+]: Secondary | ICD-10-CM | POA: Insufficient documentation

## 2021-07-22 DIAGNOSIS — M25611 Stiffness of right shoulder, not elsewhere classified: Secondary | ICD-10-CM

## 2021-07-22 NOTE — Therapy (Signed)
OUTPATIENT PHYSICAL THERAPY ONCOLOGY EVALUATION  Patient Name: Audrey [name] MRN: [id number] DOB:1940/08/19, 81 y.o., female Today's Date: 07/22/2021   PT End of Session - 07/22/21 1558     Visit Number 1    Number of Visits 19    Date for PT Re-Evaluation 09/02/21    PT Start Time 1502    PT Stop Time 1555    PT Time Calculation (min) 53 min    Activity Tolerance Patient tolerated treatment well    Behavior During Therapy [facility] for tasks assessed/performed             Past Medical History:  Diagnosis Date   Anxiety    Arthritis    "hands" (02/25/2014)   Borderline type 2 diabetes mellitus    Breast cancer, right breast ([place]) 1999   recurrence 08/2017   COVID    March 2021 and early 2022   GERD (gastroesophageal reflux disease)    High cholesterol    "can't take the RX; makes my bones ache" (02/25/2014)   History of hiatal hernia    History of stomach ulcers    Hypertension    Migraines    hx   Pneumonia    PONV (postoperative nausea and vomiting)    at last colonoscopy she was told she was hard to awaken   Shortness of breath dyspnea    w/ exertion    Past Surgical History:  Procedure Laterality Date   APPENDECTOMY     BREAST BIOPSY Right 1999   BREAST LUMPECTOMY Right 1999   BRONCHIAL BIOPSY  12/29/2020   Procedure: BRONCHIAL BIOPSIES;  Surgeon: Collene Gobble, MD;  Location: [facility] ENDOSCOPY;  Service: Pulmonary;;   BRONCHIAL BRUSHINGS  12/29/2020   Procedure: BRONCHIAL BRUSHINGS;  Surgeon: Collene Gobble, MD;  Location: [facility] ENDOSCOPY;  Service: Pulmonary;;   BRONCHIAL NEEDLE ASPIRATION BIOPSY  12/29/2020   Procedure: BRONCHIAL NEEDLE ASPIRATION BIOPSIES;  Surgeon: Collene Gobble, MD;  Location: [facility] ENDOSCOPY;  Service: Pulmonary;;   BRONCHIAL WASHINGS  12/29/2020   Procedure: BRONCHIAL WASHINGS;  Surgeon: Collene Gobble, MD;  Location: [facility] ENDOSCOPY;  Service: Pulmonary;;   DILATION AND CURETTAGE OF UTERUS     FINE NEEDLE ASPIRATION  12/29/2020    Procedure: FINE NEEDLE ASPIRATION (FNA) LINEAR;  Surgeon: Collene Gobble, MD;  Location: [facility] ENDOSCOPY;  Service: Pulmonary;;   PARTIAL KNEE ARTHROPLASTY Left 02/25/2014   Procedure: LEFT UNICOMPARTMENTAL (medial compartment) KNEE;  Surgeon: Vickey Huger, MD;  Location: [place];  Service: Orthopedics;  Laterality: Left;   REPLACEMENT UNICONDYLAR JOINT KNEE Left 02/25/2014   TONSILLECTOMY     VAGINAL HYSTERECTOMY     VIDEO BRONCHOSCOPY WITH ENDOBRONCHIAL ULTRASOUND N/A 12/29/2020   Procedure: VIDEO BRONCHOSCOPY WITH ENDOBRONCHIAL ULTRASOUND;  Surgeon: Collene Gobble, MD;  Location: [place] ENDOSCOPY;  Service: Pulmonary;  Laterality: N/A;   VIDEO BRONCHOSCOPY WITH RADIAL ENDOBRONCHIAL ULTRASOUND  12/29/2020   Procedure: RADIAL ENDOBRONCHIAL ULTRASOUND;  Surgeon: Collene Gobble, MD;  Location: [facility] ENDOSCOPY;  Service: Pulmonary;;   Patient Active Problem List   Diagnosis Date Noted   Abnormal CT of the chest 12/18/2020   Malignant neoplasm of upper-outer quadrant of right breast in female, estrogen receptor positive ([place]) 08/14/2019   S/P left unicompartmental knee replacement 02/25/2014    PCP: [facility] PROVIDER: Nicholas Lose  REFERRING DIAG: C50.411,Z17.0 (ICD-10-CM) - Malignant neoplasm of upper-outer quadrant of right breast in female, estrogen receptor positive ([place])  THERAPY DIAG:  Lymphedema, not elsewhere classified  Stiffness of  right shoulder, not elsewhere classified  Malignant neoplasm of upper-outer quadrant of right female breast, unspecified estrogen receptor status ([place])  ONSET DATE: 03/11/21  Rationale for Evaluation and Treatment Rehabilitation  SUBJECTIVE                                                                                                                                                                                           SUBJECTIVE STATEMENT: I went to my regular doctor in March and I felt like it was swelling. It felt tight. It gets  worse and gets painful. One night last week it woke me up hurting really bad. One night I was wearing a short sleeve shirt and it got so tight on my arm I had to cut the sleeve.  PERTINENT HISTORY:  H/O right breast cancer on 2017 and 1999: Lumpectomy.  (Bilateral mastectomies in 2017: could not tolerate antiestrogen therapy) Chest CT on 06/06/19 showed a mass in the right axilla and a mass in the right lower lung lobe. PET scan on 07/03/19 showed a right lower lobe pulmonary nodule concerning for malignancy, the right axillary mass consistent with local breast cancer recurrence, and small hypermetabolic right axillary and neck nodes consistent with metastatic adenopathy. Biopsy of the right axilla on 07/13/19 showed carcinoma consistent with breast carcinoma, HER-2 negative (1+), ER+ 95%, PR+ 75%, Ki67 25%. Currently taking Ibrance PAIN:  Are you having pain? No   PRECAUTIONS: Other: lymphedema risk RUE Hx of L TKA WEIGHT BEARING RESTRICTIONS No  FALLS:  Has patient fallen in last 6 months? No  LIVING ENVIRONMENT: Lives with: lives with their daughter Lives in: House/apartment Stairs: No;  Has following equipment at home: Environmental consultant - 2 wheeled  OCCUPATION: retired  LEISURE: pt reports she does not exercise  HAND DOMINANCE : right   PRIOR LEVEL OF FUNCTION: Independent  PATIENT GOALS to make the swelling go away and make it feel better   OBJECTIVE  COGNITION:  Overall cognitive status: Within functional limits for tasks assessed   PALPATION: Thick edema palpable at olecranon through axilla   POSTURE: forward head and rounded shoulders  UPPER EXTREMITY AROM/PROM:  A/PROM RIGHT   eval   Shoulder extension 56  Shoulder flexion 132  Shoulder abduction 120  Shoulder internal rotation 36  Shoulder external rotation 78    (Blank rows = not tested)  A/PROM LEFT   eval  Shoulder extension 60  Shoulder flexion 141  Shoulder abduction 161  Shoulder internal rotation 46   Shoulder external rotation 88    (Blank rows = not tested)   LYMPHEDEMA ASSESSMENTS:   SURGERY TYPE/DATE: R lumpectomy in  1999, Bilateral mastectomies in 2017  NUMBER OF LYMPH NODES REMOVED: 2 or 3 removed in 1999  CHEMOTHERAPY: did not do  RADIATION:completed in 1999/2000  HORMONE TREATMENT: Ibrance  INFECTIONS: none  LYMPHEDEMA ASSESSMENTS:   LANDMARK RIGHT  eval  10 cm proximal to olecranon process 32.7  Olecranon process 28  10 cm proximal to ulnar styloid process 22.8  Just proximal to ulnar styloid process 15  Across hand at thumb web space 18  At base of 2nd digit 5.5  (Blank rows = not tested)  LANDMARK LEFT  eval  10 cm proximal to olecranon process 28  Olecranon process 25  10 cm proximal to ulnar styloid process 20  Just proximal to ulnar styloid process 13.7  Across hand at thumb web space 17.8  At base of 2nd digit 5.5  (Blank rows = not tested)    QUICK DASH SURVEY:   Katina Dung - 07/22/21 0001     Open a tight or new jar Severe difficulty    Do heavy household chores (wash walls, wash floors) Unable    Carry a shopping bag or briefcase No difficulty    Wash your back Mild difficulty    Use a knife to cut food No difficulty    Recreational activities in which you take some force or impact through your arm, shoulder, or hand (golf, hammering, tennis) Severe difficulty    During the past week, to what extent has your arm, shoulder or hand problem interfered with your normal social activities with family, friends, neighbors, or groups? Modererately    During the past week, to what extent has your arm, shoulder or hand problem limited your work or other regular daily activities Slightly    Arm, shoulder, or hand pain. Severe    Tingling (pins and needles) in your arm, shoulder, or hand Moderate    Difficulty Sleeping Moderate difficulty    DASH Score 47.73 %               TODAY'S TREATMENT  07/22/21- cut piece of small TG soft for pt to  begin to wear to provide increased comfort  PATIENT EDUCATION:  Education details: anatomy and physiology of the lymphatic system, how lymphedema is managed, compression bandaging Person educated: Patient and Child(ren) Education method: Explanation Education comprehension: verbalized understanding   HOME EXERCISE PROGRAM: Wear TG as much as possible for increased comfort   ASSESSMENT:  CLINICAL IMPRESSION: Patient is a 81 y.o. female who was seen today for physical therapy evaluation and treatment for right UE lymphedema. Pt underwent a R lumpectomy and SLNB in 1999 and in 2018 underwent bilateral mastectomy with no nodes removed.  She has mets to her R axillary area which could be also contributing to the increase in edema. Her R upper arm is approximately 5 cm larger than the L. She also has decreased R shoulder ROM. She would benefit from skilled PT services to decrease R UE lymphedema, improve R shoulder ROM, and assist pt with obtaining appropriate compression garments for long term management.    OBJECTIVE IMPAIRMENTS decreased ROM, increased edema, increased fascial restrictions, postural dysfunction, and pain.   ACTIVITY LIMITATIONS carrying and lifting  PARTICIPATION LIMITATIONS:  none  PERSONAL FACTORS Time since onset of injury/illness/exacerbation are also affecting patient's functional outcome.   REHAB POTENTIAL: Good  CLINICAL DECISION MAKING: Stable/uncomplicated  EVALUATION COMPLEXITY: Low  GOALS: Goals reviewed with patient? Yes  SHORT TERM GOALS = LONG TERM GOALS Target date: 09/02/2021    Pt  will be independent in self MLD for long term management of lymphedema.  Baseline: no knowledge Goal status: INITIAL  2.  Pt will demonstrate a 4 cm decrease in lymphedema at 10 cm proximal to olecranon to decrease risk of edema.  Baseline: 32.7 cm Goal status: INITIAL  3.  Pt will obtain appropriate compression garments for long term management of lymphedema.   Baseline: none Goal status: INITIAL  4.  Pt will improve R shoulder abduction to 150 degrees to allow her to reach out to the side.  Baseline: 120 degrees Goal status: INITIAL  5.  Pt will report she is able to sleep through the night without being awoken by pain in R arm.  Baseline: wakes up due to pain in arm Goal status: INITIAL PLAN: PT FREQUENCY: 3x/week  PT DURATION: 6 weeks beginning Aug 7th due to scheduling  PLANNED INTERVENTIONS: Therapeutic exercises, Therapeutic activity, Patient/Family education, Self Care, Joint mobilization, Orthotic/Fit training, Manual lymph drainage, Compression bandaging, scar mobilization, Vasopneumatic device, and Manual therapy  PLAN FOR NEXT SESSION: begin complete CDT for RUE, PROM to R shoulder as tolerated   [employer], PT 07/22/2021, 5:03 PM

## 2021-07-23 ENCOUNTER — Other Ambulatory Visit (HOSPITAL_COMMUNITY): Payer: Self-pay

## 2021-07-27 ENCOUNTER — Ambulatory Visit (HOSPITAL_BASED_OUTPATIENT_CLINIC_OR_DEPARTMENT_OTHER)
Admission: RE | Admit: 2021-07-27 | Discharge: 2021-07-27 | Disposition: A | Discharge status: Home / Self Care | Payer: Medicare HMO | Source: Ambulatory Visit | Attending: Hematology and Oncology | Admitting: Hematology and Oncology

## 2021-07-27 ENCOUNTER — Other Ambulatory Visit: Payer: Self-pay

## 2021-07-27 ENCOUNTER — Inpatient Hospital Stay: Payer: Medicare HMO | Admitting: Hematology and Oncology

## 2021-07-27 ENCOUNTER — Inpatient Hospital Stay: Payer: Medicare HMO

## 2021-07-27 VITALS — BP 155/62 | HR 84 | Temp 97.8°F | Resp 18 | Ht 62.5 in | Wt 141.4 lb

## 2021-07-27 DIAGNOSIS — C50411 Malignant neoplasm of upper-outer quadrant of right female breast: Secondary | ICD-10-CM

## 2021-07-27 DIAGNOSIS — Z170 Estrogen receptor positive status [ER+]: Secondary | ICD-10-CM

## 2021-07-27 LAB — CBC WITH DIFFERENTIAL (CANCER CENTER ONLY)
Abs Immature Granulocytes: 0.02 10*3/uL (ref 0.00–0.07)
Basophils Absolute: 0.1 10*3/uL (ref 0.0–0.1)
Basophils Relative: 1 %
Eosinophils Absolute: 0 10*3/uL (ref 0.0–0.5)
Eosinophils Relative: 1 %
HCT: 33.3 % — ABNORMAL LOW (ref 36.0–46.0)
Hemoglobin: 11.4 g/dL — ABNORMAL LOW (ref 12.0–15.0)
Immature Granulocytes: 0 %
Lymphocytes Relative: 38 %
Lymphs Abs: 2.3 10*3/uL (ref 0.7–4.0)
MCH: 34.3 pg — ABNORMAL HIGH (ref 26.0–34.0)
MCHC: 34.2 g/dL (ref 30.0–36.0)
MCV: 100.3 fL — ABNORMAL HIGH (ref 80.0–100.0)
Monocytes Absolute: 0.2 10*3/uL (ref 0.1–1.0)
Monocytes Relative: 4 %
Neutro Abs: 3.3 10*3/uL (ref 1.7–7.7)
Neutrophils Relative %: 56 %
Platelet Count: 347 10*3/uL (ref 150–400)
RBC: 3.32 MIL/uL — ABNORMAL LOW (ref 3.87–5.11)
RDW: 14 % (ref 11.5–15.5)
WBC Count: 5.9 10*3/uL (ref 4.0–10.5)
nRBC: 0 % (ref 0.0–0.2)

## 2021-07-27 LAB — CMP (CANCER CENTER ONLY)
ALT: 12 U/L (ref 0–44)
AST: 15 U/L (ref 15–41)
Albumin: 4.5 g/dL (ref 3.5–5.0)
Alkaline Phosphatase: 72 U/L (ref 38–126)
Anion gap: 5 (ref 5–15)
BUN: 22 mg/dL (ref 8–23)
CO2: 27 mmol/L (ref 22–32)
Calcium: 9.7 mg/dL (ref 8.9–10.3)
Chloride: 106 mmol/L (ref 98–111)
Creatinine: 1.35 mg/dL — ABNORMAL HIGH (ref 0.44–1.00)
GFR, Estimated: 40 mL/min — ABNORMAL LOW (ref >60)
Glucose, Bld: 107 mg/dL — ABNORMAL HIGH (ref 70–99)
Potassium: 3.9 mmol/L (ref 3.5–5.1)
Sodium: 138 mmol/L (ref 135–145)
Total Bilirubin: 0.3 mg/dL (ref 0.3–1.2)
Total Protein: 7.1 g/dL (ref 6.5–8.1)

## 2021-07-27 NOTE — Progress Notes (Signed)
Right upper extremity venous duplex has been completed. Preliminary results can be found in CV Proc through chart review.  Results were given to Dr. Lindi Adie.  07/27/21 1:03 PM Carlos Levering RVT

## 2021-07-27 NOTE — Assessment & Plan Note (Signed)
H/Oright breast cancer on 2017and 1999: Lumpectomy.(Bilateral mastectomies in 2017: Seen by Dr. Lavera Guise, could not tolerate antiestrogen therapy) Chest CT on 06/06/19 showed a mass in the right axilla and a mass in the right lower lung lobe. PET scan on 07/03/19 showed a right lower lobe pulmonary nodule concerning for malignancy, the right axillary mass consistent with local breast cancer recurrence, and small hypermetabolic right axillary and neck nodes consistent with metastatic adenopathy. Biopsy of the right axilla on 07/13/19 showed carcinoma consistent with breast carcinoma, HER-2 negative (1+), ER+ 95%, PR+ 75%, Ki67 25%. ---------------------------------------------------------------------------------------------------------------------------------------------- Current treatment: Anastrozole 1 mg daily started 8/3/2021withIbrance (Verzinio was discontinued because of diarrhea) Ibrancetoxicities:Labs have been reviewed 1.Diverticulitis: Intermittent diarrhea 2.no evidence of leukopenia. 3.  Fatigue: She would like to take a lower dosage of Ibrance.  We will reduce the dosage to 75 mg 3 weeks on 1 week off starting 05/25/2021.  05/22/2021: Mild to moderate improvement in thoracic adenopathy compared to prior.  Previously hypermetabolic right lower lobe mass is minimally smaller, small sclerotic lesion T2 vertebral body stable  Return to clinic in 3 months with labs and follow-up.

## 2021-07-27 NOTE — Progress Notes (Signed)
Pt Scheduled for STAT US RUE per MD 1 PM at [facility]. She is aware and knows to check in with admitting.

## 2021-08-04 ENCOUNTER — Other Ambulatory Visit (HOSPITAL_COMMUNITY): Payer: Self-pay

## 2021-08-06 ENCOUNTER — Other Ambulatory Visit (HOSPITAL_COMMUNITY): Payer: Self-pay

## 2021-08-12 ENCOUNTER — Other Ambulatory Visit (HOSPITAL_COMMUNITY): Payer: Self-pay

## 2021-08-17 ENCOUNTER — Encounter: Payer: Self-pay | Admitting: Rehabilitation

## 2021-08-17 ENCOUNTER — Ambulatory Visit: Payer: Medicare HMO | Attending: Hematology and Oncology | Admitting: Rehabilitation

## 2021-08-17 DIAGNOSIS — I890 Lymphedema, not elsewhere classified: Secondary | ICD-10-CM | POA: Insufficient documentation

## 2021-08-17 DIAGNOSIS — M25511 Pain in right shoulder: Secondary | ICD-10-CM | POA: Insufficient documentation

## 2021-08-17 DIAGNOSIS — M62838 Other muscle spasm: Secondary | ICD-10-CM | POA: Diagnosis present

## 2021-08-17 DIAGNOSIS — M25611 Stiffness of right shoulder, not elsewhere classified: Secondary | ICD-10-CM | POA: Diagnosis present

## 2021-08-17 DIAGNOSIS — C50411 Malignant neoplasm of upper-outer quadrant of right female breast: Secondary | ICD-10-CM | POA: Insufficient documentation

## 2021-08-17 NOTE — Therapy (Signed)
OUTPATIENT PHYSICAL THERAPY ONCOLOGY TREATMENT  Patient Name: Audrey [name] MRN: [id number] DOB:Apr 04, 1940, 81 y.o., female Today's Date: 08/17/2021   PT End of Session - 08/17/21 2141     Visit Number 2    Number of Visits 19    Date for PT Re-Evaluation 09/02/21    PT Start Time 4    PT Stop Time 1200    PT Time Calculation (min) 58 min    Activity Tolerance Patient tolerated treatment well    Behavior During Therapy [facility] for tasks assessed/performed              Past Medical History:  Diagnosis Date   Anxiety    Arthritis    "hands" (02/25/2014)   Borderline type 2 diabetes mellitus    Breast cancer, right breast ([facility]) 1999   recurrence 08/2017   COVID    March 2021 and early 2022   GERD (gastroesophageal reflux disease)    High cholesterol    "can't take the RX; makes my bones ache" (02/25/2014)   History of hiatal hernia    History of stomach ulcers    Hypertension    Migraines    hx   Pneumonia    PONV (postoperative nausea and vomiting)    at last colonoscopy she was told she was hard to awaken   Shortness of breath dyspnea    w/ exertion    Past Surgical History:  Procedure Laterality Date   APPENDECTOMY     BREAST BIOPSY Right 1999   BREAST LUMPECTOMY Right 1999   BRONCHIAL BIOPSY  12/29/2020   Procedure: BRONCHIAL BIOPSIES;  Surgeon: Collene Gobble, MD;  Location: [facility] ENDOSCOPY;  Service: Pulmonary;;   BRONCHIAL BRUSHINGS  12/29/2020   Procedure: BRONCHIAL BRUSHINGS;  Surgeon: Collene Gobble, MD;  Location: [facility] ENDOSCOPY;  Service: Pulmonary;;   BRONCHIAL NEEDLE ASPIRATION BIOPSY  12/29/2020   Procedure: BRONCHIAL NEEDLE ASPIRATION BIOPSIES;  Surgeon: Collene Gobble, MD;  Location: [facility] ENDOSCOPY;  Service: Pulmonary;;   BRONCHIAL WASHINGS  12/29/2020   Procedure: BRONCHIAL WASHINGS;  Surgeon: Collene Gobble, MD;  Location: [facility] ENDOSCOPY;  Service: Pulmonary;;   DILATION AND CURETTAGE OF UTERUS     FINE NEEDLE ASPIRATION  12/29/2020    Procedure: FINE NEEDLE ASPIRATION (FNA) LINEAR;  Surgeon: Collene Gobble, MD;  Location: [facility] ENDOSCOPY;  Service: Pulmonary;;   PARTIAL KNEE ARTHROPLASTY Left 02/25/2014   Procedure: LEFT UNICOMPARTMENTAL (medial compartment) KNEE;  Surgeon: Vickey Huger, MD;  Location: [facility];  Service: Orthopedics;  Laterality: Left;   REPLACEMENT UNICONDYLAR JOINT KNEE Left 02/25/2014   TONSILLECTOMY     VAGINAL HYSTERECTOMY     VIDEO BRONCHOSCOPY WITH ENDOBRONCHIAL ULTRASOUND N/A 12/29/2020   Procedure: VIDEO BRONCHOSCOPY WITH ENDOBRONCHIAL ULTRASOUND;  Surgeon: Collene Gobble, MD;  Location: [facility] ENDOSCOPY;  Service: Pulmonary;  Laterality: N/A;   VIDEO BRONCHOSCOPY WITH RADIAL ENDOBRONCHIAL ULTRASOUND  12/29/2020   Procedure: RADIAL ENDOBRONCHIAL ULTRASOUND;  Surgeon: Collene Gobble, MD;  Location: [facility] ENDOSCOPY;  Service: Pulmonary;;   Patient Active Problem List   Diagnosis Date Noted   Abnormal CT of the chest 12/18/2020   Malignant neoplasm of upper-outer quadrant of right breast in female, estrogen receptor positive ([place]) 08/14/2019   S/P left unicompartmental knee replacement 02/25/2014    PCP: [place] PROVIDER: Nicholas Lose  REFERRING DIAG: C50.411,Z17.0 (ICD-10-CM) - Malignant neoplasm of upper-outer quadrant of right breast in female, estrogen receptor positive ([place])  THERAPY DIAG:  Lymphedema, not elsewhere classified  Stiffness  of right shoulder, not elsewhere classified  Malignant neoplasm of upper-outer quadrant of right female breast, unspecified estrogen receptor status ([place])  ONSET DATE: 03/11/21  Rationale for Evaluation and Treatment Rehabilitation  SUBJECTIVE                                                                                                                                                                                           SUBJECTIVE STATEMENT: Nothing new.  I think it has gone down a bit.    PERTINENT HISTORY:  H/O right breast cancer  on 2017 and 1999: Lumpectomy.  (Bilateral mastectomies in 2017: could not tolerate antiestrogen therapy) Chest CT on 06/06/19 showed a mass in the right axilla and a mass in the right lower lung lobe. PET scan on 07/03/19 showed a right lower lobe pulmonary nodule concerning for malignancy, the right axillary mass consistent with local breast cancer recurrence, and small hypermetabolic right axillary and neck nodes consistent with metastatic adenopathy. Biopsy of the right axilla on 07/13/19 showed carcinoma consistent with breast carcinoma, HER-2 negative (1+), ER+ 95%, PR+ 75%, Ki67 25%. Currently taking Ibrance. Negative doppler.   PAIN:  Are you having pain? No   PRECAUTIONS: Other: lymphedema RUE Hx of L TKA  WEIGHT BEARING RESTRICTIONS No  FALLS:  Has patient fallen in last 6 months? No  LIVING ENVIRONMENT: Lives with: lives with their daughter Lives in: House/apartment Stairs: No;  Has following equipment at home: Environmental consultant - 2 wheeled  OCCUPATION: retired  LEISURE: pt reports she does not exercise  HAND DOMINANCE : right   PRIOR LEVEL OF FUNCTION: Independent  PATIENT GOALS to make the swelling go away and make it feel better   OBJECTIVE  COGNITION:  Overall cognitive status: Within functional limits for tasks assessed   PALPATION: Thick edema palpable at olecranon through axilla   POSTURE: forward head and rounded shoulders  UPPER EXTREMITY AROM/PROM:  A/PROM RIGHT   eval   Shoulder extension 56  Shoulder flexion 132  Shoulder abduction 120  Shoulder internal rotation 36  Shoulder external rotation 78    (Blank rows = not tested)  A/PROM LEFT   eval  Shoulder extension 60  Shoulder flexion 141  Shoulder abduction 161  Shoulder internal rotation 46  Shoulder external rotation 88    (Blank rows = not tested)   LYMPHEDEMA ASSESSMENTS:   SURGERY TYPE/DATE: R lumpectomy in 1999, Bilateral mastectomies in 2017 [place]: 2 or 3  removed in 1999 CHEMOTHERAPY: did not do RADIATION:completed in 1999/2000 HORMONE TREATMENT: Ibrance INFECTIONS: none  LYMPHEDEMA ASSESSMENTS:   LANDMARK RIGHT  eval 08/17/21  10  cm proximal to olecranon process 32.7 32  Olecranon process _0 cm proximal to ulnar styloid process 22.8 23  Just proximal to ulnar styloid process 15 15  Across hand at thumb web space 18 18  At base of 2nd digit 5.5 5.6  (Blank rows = not tested)  LANDMARK LEFT  eval  10 cm proximal to olecranon process 28  Olecranon process 25  10 cm proximal to ulnar styloid process 20  Just proximal to ulnar styloid process 13.7  Across hand at thumb web space 17.8  At base of 2nd digit 5.5  (Blank rows = not tested)    TODAY'S TREATMENT  08/17/21 - Education on options regarding decongestion including bandages or velcro with demo and images of each option as well as cost breakdown.  Also showed pt ultimate goal of day and night garments and cost of these.  Pt is willing to attempt bandaging and daughter Levada Dy is also willing to learn so that pt feels more freedom with bandaging.  PT applied bandage with vcs and education for daughter, then daughter applied bandage with cueing and direction and then PT reapplied the bandage one more time for pt to wear home.  Daughter given a link to video and instruction to return with all supplies.   07/22/21- cut piece of small TG soft for pt to begin to wear to provide increased comfort  PATIENT EDUCATION:  Education details: anatomy and physiology of the lymphatic system, how lymphedema is managed, compression bandaging Person educated: Patient and Child(ren) Education method: Explanation Education comprehension: verbalized understanding   HOME EXERCISE PROGRAM: Daughter will attempt bandaging:  Has link to " Lymphedema Management: Caregiver Bandaging you arm"  on youtube  ASSESSMENT CLINICAL IMPRESSION: Pt was unsure about the full bandaging due to size and heat,  but tolerated the bandaging well with first application.  Pt was educated on when and how to remove and the rest of the POC for CDT.  Daughter was also educated on bandaging so that she can attempt when needed.    OBJECTIVE IMPAIRMENTS decreased ROM, increased edema, increased fascial restrictions, postural dysfunction, and pain.   ACTIVITY LIMITATIONS carrying and lifting  PARTICIPATION LIMITATIONS:  none  PERSONAL FACTORS Time since onset of injury/illness/exacerbation are also affecting patient's functional outcome.   REHAB POTENTIAL: Good  CLINICAL DECISION MAKING: Stable/uncomplicated  EVALUATION COMPLEXITY: Low  GOALS: Goals reviewed with patient? Yes  SHORT TERM GOALS = LONG TERM GOALS Target date: 09/02/2021    Pt will be independent in self MLD for long term management of lymphedema.  Baseline: no knowledge Goal status: INITIAL  2.  Pt will demonstrate a 4 cm decrease in lymphedema at 10 cm proximal to olecranon to decrease risk of edema.  Baseline: 32.7 cm Goal status: INITIAL  3.  Pt will obtain appropriate compression garments for long term management of lymphedema.  Baseline: none Goal status: INITIAL  4.  Pt will improve R shoulder abduction to 150 degrees to allow her to reach out to the side.  Baseline: 120 degrees Goal status: INITIAL  5.  Pt will report she is able to sleep through the night without being awoken by pain in R arm.  Baseline: wakes up due to pain in arm Goal status: INITIAL PLAN: PT FREQUENCY: 3x/week  PT DURATION: 6 weeks beginning Aug 7th due to scheduling  PLANNED INTERVENTIONS: Therapeutic exercises, Therapeutic activity, Patient/Family education, Self Care, Joint mobilization, Orthotic/Fit training, Manual lymph drainage, Compression bandaging, scar mobilization, Vasopneumatic  device, and Manual therapy  PLAN FOR NEXT SESSION: how was bandaging? Review with daughter as needed - include MLDb, PROM to R shoulder as  tolerated   Braxon Suder R, PT 08/17/2021, 9:42 PM   PLEASE KEEP YOUR BANDAGES ON AS LONG AS POSSIBLE TO GET THE BEST SWELLING REDUCTION. Should your bandages become uncomfortable or feel too tight, follow these steps: Elevate your extremity higher than your heart.  Try to move your arm or leg joints against the firmness of the bandage to help with moving the fluid and allow the bandages to loosen a bit.  If the bandaging is still is too tight, it is ok to carefully remove the top layer.  There will still be more layers under it that can provide compression to your extremity. Finally, if you STILL have significant pain after trying these steps, it is ok to take the bandage off.  Check your skin carefully for any signs of irritation  PLEASE bring ALL bandage materials back to your next appointment as we will reuse what we can TAKE CARE OF YOUR BANDAGES SO THEY WILL LAST LONGER AND STAY IN BETTER CONDITION Washing bandages:  Wash periodically using a mild detergent in warm water.  Do not use fabric softener or bleach.  Place bandages in a mesh lingerie bag or in a tied off pillow case and use the gentle cycle of the washing machine or hand wash. If you hand wash, you may want to put them in the spin cycle of your washer to get the extra water out, but make sure you put them in a mesh bag first. Do not wring or stretch them while they are wet.  Drying bandages: Lay the bandages out smoothly on a towel away from direct sunlight or heating sources that can damage the fabric. Rolling bandages in a towel and gently squeezing the towel to remove excess water before laying them out can speed up the process.  If you use a drying rack, place a towel on top of the rack to lay the bandages on.  If they hang down to dry, they fabric could be stretched out and the bandage will lose its compression.   Or, keep bandages in the mesh bag and dry them in the dryer on the low or no heat cycle. Rolling bandages: Please roll  your bandages after drying them so they are ready for your next treatment. If they are rolled too loose, they will be difficult to apply.  If rolled too tight, they can get stretched out.   TAKE CARE OF YOUR SKIN Apply a low pH moisturizing lotion to your skin daily Avoid scratching your skin Treat skin irritations quickly  Know the 5 warning signs of infection: redness, pain, warmth to touch, fever and increased swelling.  Call your physician immediately if you notice any of these signs of a possible infection.

## 2021-08-17 NOTE — Patient Instructions (Signed)

## 2021-08-19 ENCOUNTER — Ambulatory Visit: Payer: Medicare HMO | Admitting: Physical Therapy

## 2021-08-19 ENCOUNTER — Encounter: Payer: Self-pay | Admitting: Physical Therapy

## 2021-08-19 DIAGNOSIS — C50411 Malignant neoplasm of upper-outer quadrant of right female breast: Secondary | ICD-10-CM

## 2021-08-19 DIAGNOSIS — I890 Lymphedema, not elsewhere classified: Secondary | ICD-10-CM

## 2021-08-19 DIAGNOSIS — M25611 Stiffness of right shoulder, not elsewhere classified: Secondary | ICD-10-CM

## 2021-08-19 NOTE — Therapy (Signed)
OUTPATIENT PHYSICAL THERAPY ONCOLOGY TREATMENT  Patient Name: Audrey [name] MRN: [id number] DOB:12-30-1940, 81 y.o., female Today's Date: 08/19/2021   PT End of Session - 08/19/21 1000     Visit Number 3    Number of Visits 19    Date for PT Re-Evaluation 09/02/21    PT Start Time 1000    PT Stop Time 1102    PT Time Calculation (min) 47 min    Activity Tolerance Patient tolerated treatment well    Behavior During Therapy [facility] for tasks assessed/performed              Past Medical History:  Diagnosis Date   Anxiety    Arthritis    "hands" (02/25/2014)   Borderline type 2 diabetes mellitus    Breast cancer, right breast ([place]) 1999   recurrence 08/2017   COVID    March 2021 and early 2022   GERD (gastroesophageal reflux disease)    High cholesterol    "can't take the RX; makes my bones ache" (02/25/2014)   History of hiatal hernia    History of stomach ulcers    Hypertension    Migraines    hx   Pneumonia    PONV (postoperative nausea and vomiting)    at last colonoscopy she was told she was hard to awaken   Shortness of breath dyspnea    w/ exertion    Past Surgical History:  Procedure Laterality Date   APPENDECTOMY     BREAST BIOPSY Right 1999   BREAST LUMPECTOMY Right 1999   BRONCHIAL BIOPSY  12/29/2020   Procedure: BRONCHIAL BIOPSIES;  Surgeon: Collene Gobble, MD;  Location: [facility] ENDOSCOPY;  Service: Pulmonary;;   BRONCHIAL BRUSHINGS  12/29/2020   Procedure: BRONCHIAL BRUSHINGS;  Surgeon: Collene Gobble, MD;  Location: [facility] ENDOSCOPY;  Service: Pulmonary;;   BRONCHIAL NEEDLE ASPIRATION BIOPSY  12/29/2020   Procedure: BRONCHIAL NEEDLE ASPIRATION BIOPSIES;  Surgeon: Collene Gobble, MD;  Location: [facility] ENDOSCOPY;  Service: Pulmonary;;   BRONCHIAL WASHINGS  12/29/2020   Procedure: BRONCHIAL WASHINGS;  Surgeon: Collene Gobble, MD;  Location: [facility] ENDOSCOPY;  Service: Pulmonary;;   DILATION AND CURETTAGE OF UTERUS     FINE NEEDLE ASPIRATION  12/29/2020    Procedure: FINE NEEDLE ASPIRATION (FNA) LINEAR;  Surgeon: Collene Gobble, MD;  Location: [facility] ENDOSCOPY;  Service: Pulmonary;;   PARTIAL KNEE ARTHROPLASTY Left 02/25/2014   Procedure: LEFT UNICOMPARTMENTAL (medial compartment) KNEE;  Surgeon: Vickey Huger, MD;  Location: [place];  Service: Orthopedics;  Laterality: Left;   REPLACEMENT UNICONDYLAR JOINT KNEE Left 02/25/2014   TONSILLECTOMY     VAGINAL HYSTERECTOMY     VIDEO BRONCHOSCOPY WITH ENDOBRONCHIAL ULTRASOUND N/A 12/29/2020   Procedure: VIDEO BRONCHOSCOPY WITH ENDOBRONCHIAL ULTRASOUND;  Surgeon: Collene Gobble, MD;  Location: [facility] ENDOSCOPY;  Service: Pulmonary;  Laterality: N/A;   VIDEO BRONCHOSCOPY WITH RADIAL ENDOBRONCHIAL ULTRASOUND  12/29/2020   Procedure: RADIAL ENDOBRONCHIAL ULTRASOUND;  Surgeon: Collene Gobble, MD;  Location: [facility] END[facility]COPY;  Service: Pulmonary;;   Patient Active Problem List   Diagnosis Date Noted   Abnormal CT of the chest 12/18/2020   Malignant neoplasm of upper-outer quadrant of right breast in female, estrogen receptor positive ([place]) 08/14/2019   S/P left unicompartmental knee replacement 02/25/2014    PCP: [place] PROVIDER: Nicholas Lose  REFERRING DIAG: C50.411,Z17.0 (ICD-10-CM) - Malignant neoplasm of upper-outer quadrant of right breast in female, estrogen receptor positive ([place])  THERAPY DIAG:  Lymphedema, not elsewhere classified  Stiffness  of right shoulder, not elsewhere classified  Malignant neoplasm of upper-outer quadrant of right female breast, unspecified estrogen receptor status ([place])  ONSET DATE: 03/11/21  Rationale for Evaluation and Treatment Rehabilitation  SUBJECTIVE                                                                                                                                                                                           SUBJECTIVE STATEMENT: The bandages were a little hot.   PERTINENT HISTORY:  H/O right breast cancer on 2017 and  1999: Lumpectomy.  (Bilateral mastectomies in 2017: could not tolerate antiestrogen therapy) Chest CT on 06/06/19 showed a mass in the right axilla and a mass in the right lower lung lobe. PET scan on 07/03/19 showed a right lower lobe pulmonary nodule concerning for malignancy, the right axillary mass consistent with local breast cancer recurrence, and small hypermetabolic right axillary and neck nodes consistent with metastatic adenopathy. Biopsy of the right axilla on 07/13/19 showed carcinoma consistent with breast carcinoma, HER-2 negative (1+), ER+ 95%, PR+ 75%, Ki67 25%. Currently taking Ibrance. Negative doppler.   PAIN:  Are you having pain? No   PRECAUTIONS: Other: lymphedema RUE Hx of L TKA  WEIGHT BEARING RESTRICTIONS No  FALLS:  Has patient fallen in last 6 months? No  LIVING ENVIRONMENT: Lives with: lives with their daughter Lives in: House/apartment Stairs: No;  Has following equipment at home: Environmental consultant - 2 wheeled  OCCUPATION: retired  LEISURE: pt reports she does not exercise  HAND DOMINANCE : right   PRIOR LEVEL OF FUNCTION: Independent  PATIENT GOALS to make the swelling go away and make it feel better   OBJECTIVE  COGNITION:  Overall cognitive status: Within functional limits for tasks assessed   PALPATION: Thick edema palpable at olecranon through axilla   POSTURE: forward head and rounded shoulders  UPPER EXTREMITY AROM/PROM:  A/PROM RIGHT   eval   Shoulder extension 56  Shoulder flexion 132  Shoulder abduction 120  Shoulder internal rotation 36  Shoulder external rotation 78    (Blank rows = not tested)  A/PROM LEFT   eval  Shoulder extension 60  Shoulder flexion 141  Shoulder abduction 161  Shoulder internal rotation 46  Shoulder external rotation 88    (Blank rows = not tested)   LYMPHEDEMA ASSESSMENTS:   SURGERY TYPE/DATE: R lumpectomy in 1999, Bilateral mastectomies in 2017 [place]: 2 or 3 removed in  1999 CHEMOTHERAPY: did not do RADIATION:completed in 1999/2000 HORMONE TREATMENT: Ibrance INFECTIONS: none  LYMPHEDEMA ASSESSMENTS:   LANDMARK RIGHT  eval 08/17/21 08/19/21  10 cm proximal to olecranon process  32.7 32 31.4  Olecranon process 28 28 26  10  cm proximal to ulnar styloid process 22.8 23 22.8  Just proximal to ulnar styloid process 15 15 14.6  Across hand at thumb web space 18 18 17.5  At base of 2nd digit 5.5 5.6 5.4  (Blank rows = not tested)  LANDMARK LEFT  eval  10 cm proximal to olecranon process 28  Olecranon process 25  10 cm proximal to ulnar styloid process 20  Just proximal to ulnar styloid process 13.7  Across hand at thumb web space 17.8  At base of 2nd digit 5.5  (Blank rows = not tested)    TODAY'S TREATMENT  08/19/21-  In supine: Short neck, 5 diaphragmatic breaths, R inguinal nodes and establishment of axilloinguinal pathway, then R UE working proximal to distal, moving inner upper arm outwards and upwards, and doing both sides of forearms, spending extra time in any areas of fibrosis then retracing all steps while educating pt in basic principles of MLD  Compression bandaging: TG soft, elastomull to digits 1-5, artiflex from hand to axilla, 1 6cm bandage at hand, 1 8 cm bandage in herringbone fashion from wrist to axilla, 1 10 cm bandage from wrist to axilla  08/17/21 - Education on options regarding decongestion including bandages or velcro with demo and images of each option as well as cost breakdown.  Also showed pt ultimate goal of day and night garments and cost of these.  Pt is willing to attempt bandaging and daughter Levada Dy is also willing to learn so that pt feels more freedom with bandaging.  PT applied bandage with vcs and education for daughter, then daughter applied bandage with cueing and direction and then PT reapplied the bandage one more time for pt to wear home.  Daughter given a link to video and instruction to return with all supplies.    07/22/21- cut piece of small TG soft for pt to begin to wear to provide increased comfort  PATIENT EDUCATION:  Education details: anatomy and physiology of the lymphatic system, how lymphedema is managed, compression bandaging Person educated: Patient and Child(ren) Education method: Explanation Education comprehension: verbalized understanding   HOME EXERCISE PROGRAM: Daughter will attempt bandaging:  Has link to " Lymphedema Management: Caregiver Bandaging you arm"  on youtube  ASSESSMENT CLINICAL IMPRESSION: Remeasured circumferences and all had decreased. Removed bandages and had pt wash her arm with soap and water. Began MLD to RUE to help decrease edema while educating pt throughout on anatomy and physiology of the lymphatic system. Reapplied compression bandages at end of session.   OBJECTIVE IMPAIRMENTS decreased ROM, increased edema, increased fascial restrictions, postural dysfunction, and pain.   ACTIVITY LIMITATIONS carrying and lifting  PARTICIPATION LIMITATIONS:  none  PERSONAL FACTORS Time since onset of injury/illness/exacerbation are also affecting patient's functional outcome.   REHAB POTENTIAL: Good  CLINICAL DECISION MAKING: Stable/uncomplicated  EVALUATION COMPLEXITY: Low  GOALS: Goals reviewed with patient? Yes  SHORT TERM GOALS = LONG TERM GOALS Target date: 09/02/2021    Pt will be independent in self MLD for long term management of lymphedema.  Baseline: no knowledge Goal status: INITIAL  2.  Pt will demonstrate a 4 cm decrease in lymphedema at 10 cm proximal to olecranon to decrease risk of edema.  Baseline: 32.7 cm Goal status: INITIAL  3.  Pt will obtain appropriate compression garments for long term management of lymphedema.  Baseline: none Goal status: INITIAL  4.  Pt will improve R shoulder abduction to 150  degrees to allow her to reach out to the side.  Baseline: 120 degrees Goal status: INITIAL  5.  Pt will report she is able to  sleep through the night without being awoken by pain in R arm.  Baseline: wakes up due to pain in arm Goal status: INITIAL PLAN: PT FREQUENCY: 3x/week  PT DURATION: 6 weeks beginning Aug 7th due to scheduling  PLANNED INTERVENTIONS: Therapeutic exercises, Therapeutic activity, Patient/Family education, Self Care, Joint mobilization, Orthotic/Fit training, Manual lymph drainage, Compression bandaging, scar mobilization, Vasopneumatic device, and Manual therapy  PLAN FOR NEXT SESSION: how was bandaging? Review with daughter as needed - include MLDb, PROM to R shoulder as tolerated   [employer], PT 08/19/2021, 10:56 AM   PLEASE KEEP YOUR BANDAGES ON AS LONG AS POSSIBLE TO GET THE BEST SWELLING REDUCTION. Should your bandages become uncomfortable or feel too tight, follow these steps: Elevate your extremity higher than your heart.  Try to move your arm or leg joints against the firmness of the bandage to help with moving the fluid and allow the bandages to loosen a bit.  If the bandaging is still is too tight, it is ok to carefully remove the top layer.  There will still be more layers under it that can provide compression to your extremity. Finally, if you STILL have significant pain after trying these steps, it is ok to take the bandage off.  Check your skin carefully for any signs of irritation  PLEASE bring ALL bandage materials back to your next appointment as we will reuse what we can TAKE CARE OF YOUR BANDAGES SO THEY WILL LAST LONGER AND STAY IN BETTER CONDITION Washing bandages:  Wash periodically using a mild detergent in warm water.  Do not use fabric softener or bleach.  Place bandages in a mesh lingerie bag or in a tied off pillow case and use the gentle cycle of the washing machine or hand wash. If you hand wash, you may want to put them in the spin cycle of your washer to get the extra water out, but make sure you put them in a mesh bag first. Do not wring or stretch them  while they are wet.  Drying bandages: Lay the bandages out smoothly on a towel away from direct sunlight or heating sources that can damage the fabric. Rolling bandages in a towel and gently squeezing the towel to remove excess water before laying them out can speed up the process.  If you use a drying rack, place a towel on top of the rack to lay the bandages on.  If they hang down to dry, they fabric could be stretched out and the bandage will lose its compression.   Or, keep bandages in the mesh bag and dry them in the dryer on the low or no heat cycle. Rolling bandages: Please roll your bandages after drying them so they are ready for your next treatment. If they are rolled too loose, they will be difficult to apply.  If rolled too tight, they can get stretched out.   TAKE CARE OF YOUR SKIN Apply a low pH moisturizing lotion to your skin daily Avoid scratching your skin Treat skin irritations quickly  Know the 5 warning signs of infection: redness, pain, warmth to touch, fever and increased swelling.  Call your physician immediately if you notice any of these signs of a possible infection.

## 2021-08-21 ENCOUNTER — Ambulatory Visit: Payer: Medicare HMO

## 2021-08-21 DIAGNOSIS — I890 Lymphedema, not elsewhere classified: Secondary | ICD-10-CM | POA: Diagnosis not present

## 2021-08-21 DIAGNOSIS — M25611 Stiffness of right shoulder, not elsewhere classified: Secondary | ICD-10-CM

## 2021-08-21 DIAGNOSIS — C50411 Malignant neoplasm of upper-outer quadrant of right female breast: Secondary | ICD-10-CM

## 2021-08-21 NOTE — Therapy (Signed)
OUTPATIENT PHYSICAL THERAPY ONCOLOGY TREATMENT  Patient Name: Audrey [name] MRN: [id number] DOB:08/07/1940, 81 y.o., female Today's Date: 08/21/2021   PT End of Session - 08/21/21 0913     Visit Number 4    Number of Visits 19    Date for PT Re-Evaluation 09/02/21    PT Start Time 0907    PT Stop Time 1007    PT Time Calculation (min) 60 min    Activity Tolerance Patient tolerated treatment well    Behavior During Therapy [facility] for tasks assessed/performed              Past Medical History:  Diagnosis Date   Anxiety    Arthritis    "hands" (02/25/2014)   Borderline type 2 diabetes mellitus    Breast cancer, right breast ([place]) 1999   recurrence 08/2017   COVID    March 2021 and early 2022   GERD (gastroesophageal reflux disease)    High cholesterol    "can't take the RX; makes my bones ache" (02/25/2014)   History of hiatal hernia    History of stomach ulcers    Hypertension    Migraines    hx   Pneumonia    PONV (postoperative nausea and vomiting)    at last colonoscopy she was told she was hard to awaken   Shortness of breath dyspnea    w/ exertion    Past Surgical History:  Procedure Laterality Date   APPENDECTOMY     BREAST BIOPSY Right 1999   BREAST LUMPECTOMY Right 1999   BRONCHIAL BIOPSY  12/29/2020   Procedure: BRONCHIAL BIOPSIES;  Surgeon: Collene Gobble, MD;  Location: [facility] ENDOSCOPY;  Service: Pulmonary;;   BRONCHIAL BRUSHINGS  12/29/2020   Procedure: BRONCHIAL BRUSHINGS;  Surgeon: Collene Gobble, MD;  Location: [facility] ENDOSCOPY;  Service: Pulmonary;;   BRONCHIAL NEEDLE ASPIRATION BIOPSY  12/29/2020   Procedure: BRONCHIAL NEEDLE ASPIRATION BIOPSIES;  Surgeon: Collene Gobble, MD;  Location: [facility]OSCOPY;  Service: Pulmonary;;   BRONCHIAL WASHINGS  12/29/2020   Procedure: BRONCHIAL WASHINGS;  Surgeon: Collene Gobble, MD;  Location: [facility]OSCOPY;  Service: Pulmonary;;   DILATION AND CURETTAGE OF UTERUS     FINE NEEDLE ASPIRATION  12/29/2020    Procedure: FINE NEEDLE ASPIRATION (FNA) LINEAR;  Surgeon: Collene Gobble, MD;  Location: [facility]OSCOPY;  Service: Pulmonary;;   PARTIAL KNEE ARTHROPLASTY Left 02/25/2014   Procedure: LEFT UNICOMPARTMENTAL (medial compartment) KNEE;  Surgeon: Vickey Huger, MD;  Location: [facility];  Service: Orthopedics;  Laterality: Left;   REPLACEMENT UNICONDYLAR JOINT KNEE Left 02/25/2014   TONSILLECTOMY     VAGINAL HYSTERECTOMY     VIDEO BRONCHOSCOPY WITH ENDOBRONCHIAL ULTRASOUND N/A 12/29/2020   Procedure: VIDEO BRONCHOSCOPY WITH ENDOBRONCHIAL ULTRASOUND;  Surgeon: Collene Gobble, MD;  Location: [facility]OSCOPY;  Service: Pulmonary;  Laterality: N/A;   VIDEO BRONCHOSCOPY WITH RADIAL ENDOBRONCHIAL ULTRASOUND  12/29/2020   Procedure: RADIAL ENDOBRONCHIAL ULTRASOUND;  Surgeon: Collene Gobble, MD;  [facility] END[facility]COPY;  Service: Pulmonary;;   Patient Active Problem List   Diagnosis Date Noted   Abnormal CT of the chest 12/18/2020   Malignant neoplasm of upper-outer quadrant of right breast in female, estrogen receptor positive ([place]) 08/14/2019   S/P left unicompartmental knee replacement 02/25/2014    PCP: [place] PROVIDER: Nicholas Lose  REFERRING DIAG: C50.411,Z17.0 (ICD-10-CM) - Malignant neoplasm of upper-outer quadrant of right breast in female, estrogen receptor positive ([place])  THERAPY DIAG:  Lymphedema, not elsewhere classified  Stiffness  of right shoulder, not elsewhere classified  Malignant neoplasm of upper-outer quadrant of right female breast, unspecified estrogen receptor status ([place])  ONSET DATE: 03/11/21  Rationale for Evaluation and Treatment Rehabilitation  SUBJECTIVE                                                                                                                                                                                           SUBJECTIVE STATEMENT: The bandaging is going okay. My daughter unfortunately lost her job but that means we can come  3x/wk now for treatment.  PERTINENT HISTORY:  H/O right breast cancer on 2017 and 1999: Lumpectomy.  (Bilateral mastectomies in 2017: could not tolerate antiestrogen therapy) Chest CT on 06/06/19 showed a mass in the right axilla and a mass in the right lower lung lobe. PET scan on 07/03/19 showed a right lower lobe pulmonary nodule concerning for malignancy, the right axillary mass consistent with local breast cancer recurrence, and small hypermetabolic right axillary and neck nodes consistent with metastatic adenopathy. Biopsy of the right axilla on 07/13/19 showed carcinoma consistent with breast carcinoma, HER-2 negative (1+), ER+ 95%, PR+ 75%, Ki67 25%. Currently taking Ibrance. Negative doppler.   PAIN:  Are you having pain? No   PRECAUTIONS: Other: lymphedema RUE Hx of L TKA  WEIGHT BEARING RESTRICTIONS No  FALLS:  Has patient fallen in last 6 months? No  LIVING ENVIRONMENT: Lives with: lives with their daughter Lives in: House/apartment Stairs: No;  Has following equipment at home: Environmental consultant - 2 wheeled  OCCUPATION: retired  LEISURE: pt reports she does not exercise  HAND DOMINANCE : right   PRIOR LEVEL OF FUNCTION: Independent  PATIENT GOALS to make the swelling go away and make it feel better   OBJECTIVE  COGNITION:  Overall cognitive status: Within functional limits for tasks assessed   PALPATION: Thick edema palpable at olecranon through axilla   POSTURE: forward head and rounded shoulders  UPPER EXTREMITY AROM/PROM:  A/PROM RIGHT   eval   Shoulder extension 56  Shoulder flexion 132  Shoulder abduction 120  Shoulder internal rotation 36  Shoulder external rotation 78    (Blank rows = not tested)  A/PROM LEFT   eval  Shoulder extension 60  Shoulder flexion 141  Shoulder abduction 161  Shoulder internal rotation 46  Shoulder external rotation 88    (Blank rows = not tested)   LYMPHEDEMA ASSESSMENTS:   SURGERY TYPE/DATE: R lumpectomy in 1999,  Bilateral mastectomies in 2017 [place]: 2 or 3 removed in 1999 CHEMOTHERAPY: did not do RADIATION:completed in 1999/2000 HORMONE TREATMENT: Ibrance INFECTIONS: none  LYMPHEDEMA ASSESSMENTS:  LANDMARK RIGHT  eval 08/17/21 08/19/21  10 cm proximal to olecranon process 32.7 32 31.4  Olecranon process 28 28 26  10  cm proximal to ulnar styloid process 22.8 23 22.8  Just proximal to ulnar styloid process 15 15 14.6  Across hand at thumb web space 18 18 17.5  At base of 2nd digit 5.5 5.6 5.4  (Blank rows = not tested)  LANDMARK LEFT  eval  10 cm proximal to olecranon process 28  Olecranon process 25  10 cm proximal to ulnar styloid process 20  Just proximal to ulnar styloid process 13.7  Across hand at thumb web space 17.8  At base of 2nd digit 5.5  (Blank rows = not tested)    TODAY'S TREATMENT  08/21/21: Manual Therapy In supine: Short neck, 5 diaphragmatic breaths, R inguinal nodes and establishment of Rt axillo-inguinal pathway, then Rt UE working proximal to distal, moving inner upper arm outwards and upwards, and doing both sides of forearms, spending extra time in any areas of fibrosis then retracing all steps while reviewing with pt and daughter basic principles of MLD and basics of anatomy of lymphatic system P/ROM: In Supine after MLD to Rt shoulder into flexion and abduction to pts available end ROM with VCs to relax during Compression bandaging: TG soft, elastomull to digits 1-5, artiflex from hand to axilla, 1 6cm bandage at hand, 1 8 cm bandage for "X" at elbow, and 1 10 cm bandage from wrist to axilla  08/19/21- In supine: Short neck, 5 diaphragmatic breaths, R inguinal nodes and establishment of axilloinguinal pathway, then R UE working proximal to distal, moving inner upper arm outwards and upwards, and doing both sides of forearms, spending extra time in any areas of fibrosis then retracing all steps while educating pt in basic principles of  MLD Compression bandaging: TG soft, elastomull to digits 1-5, artiflex from hand to axilla, 1 6cm bandage at hand, 1 8 cm bandage in herringbone fashion from wrist to axilla, 1 10 cm bandage from wrist to axilla  08/17/21 - Education on options regarding decongestion including bandages or velcro with demo and images of each option as well as cost breakdown.  Also showed pt ultimate goal of day and night garments and cost of these.  Pt is willing to attempt bandaging and daughter Levada Dy is also willing to learn so that pt feels more freedom with bandaging.  PT applied bandage with vcs and education for daughter, then daughter applied bandage with cueing and direction and then PT reapplied the bandage one more time for pt to wear home.  Daughter given a link to video and instruction to return with all supplies.    PATIENT EDUCATION:  Education details: anatomy and physiology of the lymphatic system, how lymphedema is managed, compression bandaging Person educated: Patient and Child(ren) Education method: Explanation Education comprehension: verbalized understanding   HOME EXERCISE PROGRAM: Daughter will attempt bandaging:  Has link to " Lymphedema Management: Caregiver Bandaging you arm"  on youtube  ASSESSMENT CLINICAL IMPRESSION: Pts daughter present and reports she just lost her job so they will be able to come for suggested 2-3x/wk. Did start educating daughter though in compression bandaging in case they need to save on gas money due to her job loss as she will have time to help her mom at home now. Pt arrived with bandages still intact so removed these and she washed her arm. Then continued with CDT for her Rt UE.   OBJECTIVE IMPAIRMENTS decreased ROM, increased  edema, increased fascial restrictions, postural dysfunction, and pain.   ACTIVITY LIMITATIONS carrying and lifting  PARTICIPATION LIMITATIONS:  none  PERSONAL FACTORS Time since onset of injury/illness/exacerbation are also  affecting patient's functional outcome.   REHAB POTENTIAL: Good  CLINICAL DECISION MAKING: Stable/uncomplicated  EVALUATION COMPLEXITY: Low  GOALS: Goals reviewed with patient? Yes  SHORT TERM GOALS = LONG TERM GOALS Target date: 09/02/2021    Pt will be independent in self MLD for long term management of lymphedema.  Baseline: no knowledge Goal status: INITIAL  2.  Pt will demonstrate a 4 cm decrease in lymphedema at 10 cm proximal to olecranon to decrease risk of edema.  Baseline: 32.7 cm Goal status: INITIAL  3.  Pt will obtain appropriate compression garments for long term management of lymphedema.  Baseline: none Goal status: INITIAL  4.  Pt will improve R shoulder abduction to 150 degrees to allow her to reach out to the side.  Baseline: 120 degrees Goal status: INITIAL  5.  Pt will report she is able to sleep through the night without being awoken by pain in R arm.  Baseline: wakes up due to pain in arm Goal status: INITIAL PLAN: PT FREQUENCY: 3x/week  PT DURATION: 6 weeks beginning Aug 7th due to scheduling  PLANNED INTERVENTIONS: Therapeutic exercises, Therapeutic activity, Patient/Family education, Self Care, Joint mobilization, Orthotic/Fit training, Manual lymph drainage, Compression bandaging, scar mobilization, Vasopneumatic device, and Manual therapy  PLAN FOR NEXT SESSION:  Review with daughter as needed - include MLD, PROM to R shoulder as tolerated   Otelia Limes, PTA 08/21/2021, 11:48 AM   PLEASE KEEP YOUR BANDAGES ON AS LONG AS POSSIBLE TO GET THE BEST SWELLING REDUCTION. Should your bandages become uncomfortable or feel too tight, follow these steps: Elevate your extremity higher than your heart.  Try to move your arm or leg joints against the firmness of the bandage to help with moving the fluid and allow the bandages to loosen a bit.  If the bandaging is still is too tight, it is ok to carefully remove the top layer.  There will still  be more layers under it that can provide compression to your extremity. Finally, if you STILL have significant pain after trying these steps, it is ok to take the bandage off.  Check your skin carefully for any signs of irritation  PLEASE bring ALL bandage materials back to your next appointment as we will reuse what we can TAKE CARE OF YOUR BANDAGES SO THEY WILL LAST LONGER AND STAY IN BETTER CONDITION Washing bandages:  Wash periodically using a mild detergent in warm water.  Do not use fabric softener or bleach.  Place bandages in a mesh lingerie bag or in a tied off pillow case and use the gentle cycle of the washing machine or hand wash. If you hand wash, you may want to put them in the spin cycle of your washer to get the extra water out, but make sure you put them in a mesh bag first. Do not wring or stretch them while they are wet.  Drying bandages: Lay the bandages out smoothly on a towel away from direct sunlight or heating sources that can damage the fabric. Rolling bandages in a towel and gently squeezing the towel to remove excess water before laying them out can speed up the process.  If you use a drying rack, place a towel on top of the rack to lay the bandages on.  If they hang down to dry,  they fabric could be stretched out and the bandage will lose its compression.   Or, keep bandages in the mesh bag and dry them in the dryer on the low or no heat cycle. Rolling bandages: Please roll your bandages after drying them so they are ready for your next treatment. If they are rolled too loose, they will be difficult to apply.  If rolled too tight, they can get stretched out.   TAKE CARE OF YOUR SKIN Apply a low pH moisturizing lotion to your skin daily Avoid scratching your skin Treat skin irritations quickly  Know the 5 warning signs of infection: redness, pain, warmth to touch, fever and increased swelling.  Call your physician immediately if you notice any of these signs of a possible  infection.

## 2021-08-24 ENCOUNTER — Other Ambulatory Visit (HOSPITAL_COMMUNITY): Payer: Self-pay

## 2021-08-24 ENCOUNTER — Encounter: Payer: Self-pay | Admitting: Physical Therapy

## 2021-08-24 ENCOUNTER — Ambulatory Visit: Payer: Medicare HMO | Admitting: Physical Therapy

## 2021-08-24 DIAGNOSIS — M25611 Stiffness of right shoulder, not elsewhere classified: Secondary | ICD-10-CM

## 2021-08-24 DIAGNOSIS — C50411 Malignant neoplasm of upper-outer quadrant of right female breast: Secondary | ICD-10-CM

## 2021-08-24 DIAGNOSIS — I890 Lymphedema, not elsewhere classified: Secondary | ICD-10-CM

## 2021-08-24 NOTE — Therapy (Signed)
OUTPATIENT PHYSICAL THERAPY ONCOLOGY TREATMENT  Patient Name: MONIC ENGELMANN MRN: [id number] DOB:[birth year], 81 y.o., female Today's Date: 08/24/2021   PT End of Session - 08/24/21 1008     Visit Number 5    Number of Visits 19    Date for PT Re-Evaluation 09/02/21    PT Start Time 1007    PT Stop Time 5974    PT Time Calculation (min) 51 min    Activity Tolerance Patient tolerated treatment well    Behavior During Therapy [facility] for tasks assessed/performed              Past Medical History:  Diagnosis Date   Anxiety    Arthritis    "hands" (02/25/2014)   Borderline type 2 diabetes mellitus    Breast cancer, right breast ([place]) 1999   recurrence 08/2017   COVID    March 2021 and early 2022   GERD (gastroesophageal reflux disease)    High cholesterol    "can't take the RX; makes my bones ache" (02/25/2014)   History of hiatal hernia    History of stomach ulcers    Hypertension    Migraines    hx   Pneumonia    PONV (postoperative nausea and vomiting)    at last colonoscopy she was told she was hard to awaken   Shortness of breath dyspnea    w/ exertion    Past Surgical History:  Procedure Laterality Date   APPENDECTOMY     BREAST BIOPSY Right 1999   BREAST LUMPECTOMY Right 1999   BRONCHIAL BIOPSY  12/29/2020   Procedure: BRONCHIAL BIOPSIES;  Surgeon: Collene Gobble, MD;  Location: [facility] ENDOS[facility];  Service: Pulmonary;;   BRONCHIAL BRUSHINGS  12/29/2020   Procedure: BRONCHIAL BRUSHINGS;  Surgeon: Collene Gobble, MD;  Location: [facility] ENDOSCOPY;  Service: Pulmonary;;   BRONCHIAL NEEDLE ASPIRATION BIOPSY  12/29/2020   Procedure: BRONCHIAL NEEDLE ASPIRATION BIOPSIES;  Surgeon: Collene Gobble, MD;  Location: [facility] ENDOSCOPY;  Service: Pulmonary;;   BRONCHIAL WASHINGS  12/29/2020   Procedure: BRONCHIAL WASHINGS;  Surgeon: Collene Gobble, MD;  Location: [facility]OSCOPY;  Service: Pulmonary;;   DILATION AND CURETTAGE OF UTERUS     FINE NEEDLE ASPIRATION  12/29/2020    Procedure: FINE NEEDLE ASPIRATION (FNA) LINEAR;  Surgeon: Collene Gobble, MD;  Location: [facility]OS[facility];  Service: Pulmonary;;   PARTIAL KNEE ARTHROPLASTY Left 02/25/2014   Procedure: LEFT UNICOMPARTMENTAL (medial compartment) KNEE;  Surgeon: Vickey Huger, MD;  Location: [facility];  Service: Orthopedics;  Laterality: Left;   REPLACEMENT UNICONDYLAR JOINT KNEE Left 02/25/2014   TONSILLECTOMY     VAGINAL HYSTERECTOMY     VIDEO BRONCHOSCOPY WITH ENDOBRONCHIAL ULTRASOUND N/A 12/29/2020   Procedure: VIDEO BRONCHOSCOPY WITH ENDOBRONCHIAL ULTRASOUND;  Surgeon: Collene Gobble, MD;  Location: [facility];  Service: Pulmonary;  Laterality: N/A;   VIDEO BRONCHOSCOPY WITH RADIAL ENDOBRONCHIAL ULTRASOUND  12/29/2020   Procedure: RADIAL ENDOBRONCHIAL ULTRASOUND;  Surgeon: Collene Gobble, MD;  Location: [facility] ENDOSCOPY;  Service: Pulmonary;;   Patient Active Problem List   Diagnosis Date Noted   Abnormal CT of the chest 12/18/2020   Malignant neoplasm of upper-outer quadrant of right breast in female, estrogen receptor positive ([place]) 08/14/2019   S/P left unicompartmental knee replacement 02/25/2014    PCP: [facility] PROVIDER: Nicholas Lose  REFERRING DIAG: C50.411,Z17.0 (ICD-10-CM) - Malignant neoplasm of upper-outer quadrant of right breast in female, estrogen receptor positive ([place])  THERAPY DIAG:  Lymphedema, not elsewhere classified  Stiffness  of right shoulder, not elsewhere classified  Malignant neoplasm of upper-outer quadrant of right female breast, unspecified estrogen receptor status ([place])  ONSET DATE: 03/11/21  Rationale for Evaluation and Treatment Rehabilitation  SUBJECTIVE                                                                                                                                                                                           SUBJECTIVE STATEMENT: The top bandage got loose and I put it back on. I have had recent discomfort in my R armpit.  Feels like a bruise.   PERTINENT HISTORY:  H/O right breast cancer on 2017 and 1999: Lumpectomy.  (Bilateral mastectomies in 2017: could not tolerate antiestrogen therapy) Chest CT on 06/06/19 showed a mass in the right axilla and a mass in the right lower lung lobe. PET scan on 07/03/19 showed a right lower lobe pulmonary nodule concerning for malignancy, the right axillary mass consistent with local breast cancer recurrence, and small hypermetabolic right axillary and neck nodes consistent with metastatic adenopathy. Biopsy of the right axilla on 07/13/19 showed carcinoma consistent with breast carcinoma, HER-2 negative (1+), ER+ 95%, PR+ 75%, Ki67 25%. Currently taking Ibrance. Negative doppler.   PAIN:  Are you having pain? No   PRECAUTIONS: Other: lymphedema RUE Hx of L TKA  WEIGHT BEARING RESTRICTIONS No  FALLS:  Has patient fallen in last 6 months? No  LIVING ENVIRONMENT: Lives with: lives with their daughter Lives in: House/apartment Stairs: No;  Has following equipment at home: Environmental consultant - 2 wheeled  OCCUPATION: retired  LEISURE: pt reports she does not exercise  HAND DOMINANCE : right   PRIOR LEVEL OF FUNCTION: Independent  PATIENT GOALS to make the swelling go away and make it feel better   OBJECTIVE  COGNITION:  Overall cognitive status: Within functional limits for tasks assessed   PALPATION: Thick edema palpable at olecranon through axilla   POSTURE: forward head and rounded shoulders  UPPER EXTREMITY AROM/PROM:  A/PROM RIGHT   eval   Shoulder extension 56  Shoulder flexion 132  Shoulder abduction 120  Shoulder internal rotation 36  Shoulder external rotation 78    (Blank rows = not tested)  A/PROM LEFT   eval  Shoulder extension 60  Shoulder flexion 141  Shoulder abduction 161  Shoulder internal rotation 46  Shoulder external rotation 88    (Blank rows = not tested)   LYMPHEDEMA ASSESSMENTS:   SURGERY TYPE/DATE: R lumpectomy in 1999,  Bilateral mastectomies in 2017 [place]: 2 or 3 removed in 1999 CHEMOTHERAPY: did not do RADIATION:completed in 1999/2000 HORMONE TREATMENT: Ibrance INFECTIONS: none  LYMPHEDEMA ASSESSMENTS:   LANDMARK RIGHT  eval 08/17/21 08/19/21 08/24/21  10 cm proximal to olecranon process 32.7 32 31.4 31.5  Olecranon process 28 28 26 26  10  cm proximal to ulnar styloid process 22.8 23 22.8 21.8  Just proximal to ulnar styloid process 15 15 14.6 15  Across hand at thumb web space 18 18 17.5 17.8  At base of 2nd digit 5.5 5.6 5.4 5.4  (Blank rows = not tested)  LANDMARK LEFT  eval  10 cm proximal to olecranon process 28  Olecranon process 25  10 cm proximal to ulnar styloid process 20  Just proximal to ulnar styloid process 13.7  Across hand at thumb web space 17.8  At base of 2nd digit 5.5  (Blank rows = not tested)    TODAY'S TREATMENT  08/24/21: Removed bandages and had pt wash her arm with soap and water. Remeasured circumferences and all circumferences are within 2 cm of the left side except for the upper arm but that is only 3 cm away. Proceed with educating pt about different types of commercially available compression garments including flat knit vs circular knit sleeves and night time garments. Measured pt for a flat knit [employer] sleeve and glove (size 1) and a [employer] Profile size 2 night time garment. [employer] is covering her glove and night time garment which was ordered today and pt covered her compression sleeve.  Compression bandaging: TG soft, elastomull to digits 1-5, artiflex from hand to axilla, 1 6cm bandage at hand, 1 8 cm bandage for "X" at elbow, and 1 10 cm bandage from wrist to axilla  08/21/21: Manual Therapy In supine: Short neck, 5 diaphragmatic breaths, R inguinal nodes and establishment of Rt axillo-inguinal pathway, then Rt UE working proximal to distal, moving inner upper arm outwards and upwards, and doing both sides of forearms,  spending extra time in any areas of fibrosis then retracing all steps while reviewing with pt and daughter basic principles of MLD and basics of anatomy of lymphatic system P/ROM: In Supine after MLD to Rt shoulder into flexion and abduction to pts available end ROM with VCs to relax during Compression bandaging: TG soft, elastomull to digits 1-5, artiflex from hand to axilla, 1 6cm bandage at hand, 1 8 cm bandage for "X" at elbow, and 1 10 cm bandage from wrist to axilla  08/19/21- In supine: Short neck, 5 diaphragmatic breaths, R inguinal nodes and establishment of axilloinguinal pathway, then R UE working proximal to distal, moving inner upper arm outwards and upwards, and doing both sides of forearms, spending extra time in any areas of fibrosis then retracing all steps while educating pt in basic principles of MLD Compression bandaging: TG soft, elastomull to digits 1-5, artiflex from hand to axilla, 1 6cm bandage at hand, 1 8 cm bandage in herringbone fashion from wrist to axilla, 1 10 cm bandage from wrist to axilla  08/17/21 - Education on options regarding decongestion including bandages or velcro with demo and images of each option as well as cost breakdown.  Also showed pt ultimate goal of day and night garments and cost of these.  Pt is willing to attempt bandaging and daughter Levada Dy is also willing to learn so that pt feels more freedom with bandaging.  PT applied bandage with vcs and education for daughter, then daughter applied bandage with cueing and direction and then PT reapplied the bandage one more time for pt to wear home.  Daughter given a link to  video and instruction to return with all supplies.    PATIENT EDUCATION:  Education details: anatomy and physiology of the lymphatic system, how lymphedema is managed, compression bandaging Person educated: Patient and Child(ren) Education method: Explanation Education comprehension: verbalized understanding   HOME EXERCISE  PROGRAM: Daughter will attempt bandaging:  Has link to " Lymphedema Management: Caregiver Bandaging you arm"  on youtube  ASSESSMENT CLINICAL IMPRESSION: Pt returns today with bandages intact. She reports they had slid down some and she reapplied. Her measurements are all very similar to the left side except for the upper arm which is 3.5 cm different from L side. Measured pt today for compression garments (night time garment and glove from [employer], sleeve pt purchased separately). Continued with bandaging until her garments arrive.   OBJECTIVE IMPAIRMENTS decreased ROM, increased edema, increased fascial restrictions, postural dysfunction, and pain.   ACTIVITY LIMITATIONS carrying and lifting  PARTICIPATION LIMITATIONS:  none  PERSONAL FACTORS Time since onset of injury/illness/exacerbation are also affecting patient's functional outcome.   REHAB POTENTIAL: Good  CLINICAL DECISION MAKING: Stable/uncomplicated  EVALUATION COMPLEXITY: Low  GOALS: Goals reviewed with patient? Yes  SHORT TERM GOALS = LONG TERM GOALS Target date: 09/02/2021    Pt will be independent in self MLD for long term management of lymphedema.  Baseline: no knowledge Goal status: INITIAL  2.  Pt will demonstrate a 4 cm decrease in lymphedema at 10 cm proximal to olecranon to decrease risk of edema.  Baseline: 32.7 cm Goal status: INITIAL  3.  Pt will obtain appropriate compression garments for long term management of lymphedema.  Baseline: none Goal status: INITIAL  4.  Pt will improve R shoulder abduction to 150 degrees to allow her to reach out to the side.  Baseline: 120 degrees Goal status: INITIAL  5.  Pt will report she is able to sleep through the night without being awoken by pain in R arm.  Baseline: wakes up due to pain in arm Goal status: INITIAL PLAN: PT FREQUENCY: 3x/week  PT DURATION: 6 weeks beginning Aug 7th due to scheduling  PLANNED INTERVENTIONS: Therapeutic exercises,  Therapeutic activity, Patient/Family education, Self Care, Joint mobilization, Orthotic/Fit training, Manual lymph drainage, Compression bandaging, scar mobilization, Vasopneumatic device, and Manual therapy  PLAN FOR NEXT SESSION:  Review with daughter as needed - include MLD, PROM to R shoulder as tolerated, did pt get garments?   [employer], PT 08/24/2021, 11:05 AM   PLEASE KEEP YOUR BANDAGES ON AS LONG AS POSSIBLE TO GET THE BEST SWELLING REDUCTION. Should your bandages become uncomfortable or feel too tight, follow these steps: Elevate your extremity higher than your heart.  Try to move your arm or leg joints against the firmness of the bandage to help with moving the fluid and allow the bandages to loosen a bit.  If the bandaging is still is too tight, it is ok to carefully remove the top layer.  There will still be more layers under it that can provide compression to your extremity. Finally, if you STILL have significant pain after trying these steps, it is ok to take the bandage off.  Check your skin carefully for any signs of irritation  PLEASE bring ALL bandage materials back to your next appointment as we will reuse what we can TAKE CARE OF YOUR BANDAGES SO THEY WILL LAST LONGER AND STAY IN BETTER CONDITION Washing bandages:  Wash periodically using a mild detergent in warm water.  Do not use fabric softener or bleach.  Place  bandages in a mesh lingerie bag or in a tied off pillow case and use the gentle cycle of the washing machine or hand wash. If you hand wash, you may want to put them in the spin cycle of your washer to get the extra water out, but make sure you put them in a mesh bag first. Do not wring or stretch them while they are wet.  Drying bandages: Lay the bandages out smoothly on a towel away from direct sunlight or heating sources that can damage the fabric. Rolling bandages in a towel and gently squeezing the towel to remove excess water before laying them out  can speed up the process.  If you use a drying rack, place a towel on top of the rack to lay the bandages on.  If they hang down to dry, they fabric could be stretched out and the bandage will lose its compression.   Or, keep bandages in the mesh bag and dry them in the dryer on the low or no heat cycle. Rolling bandages: Please roll your bandages after drying them so they are ready for your next treatment. If they are rolled too loose, they will be difficult to apply.  If rolled too tight, they can get stretched out.   TAKE CARE OF YOUR SKIN Apply a low pH moisturizing lotion to your skin daily Avoid scratching your skin Treat skin irritations quickly  Know the 5 warning signs of infection: redness, pain, warmth to touch, fever and increased swelling.  Call your physician immediately if you notice any of these signs of a possible infection.

## 2021-08-26 ENCOUNTER — Encounter: Payer: Self-pay | Admitting: Physical Therapy

## 2021-08-26 ENCOUNTER — Ambulatory Visit: Payer: Medicare HMO | Admitting: Physical Therapy

## 2021-08-26 DIAGNOSIS — I890 Lymphedema, not elsewhere classified: Secondary | ICD-10-CM

## 2021-08-26 DIAGNOSIS — C50411 Malignant neoplasm of upper-outer quadrant of right female breast: Secondary | ICD-10-CM

## 2021-08-26 DIAGNOSIS — M25611 Stiffness of right shoulder, not elsewhere classified: Secondary | ICD-10-CM

## 2021-08-26 NOTE — Therapy (Signed)
OUTPATIENT PHYSICAL THERAPY ONCOLOGY TREATMENT  Patient Name: Audrey [name] MRN: [id number] DOB:[birth year], 81 y.o., female Today's Date: 08/26/2021   PT End of Session - 08/26/21 1005     Visit Number 6    Number of Visits 19    Date for PT Re-Evaluation 09/02/21    PT Start Time 1004    PT Stop Time 1055    PT Time Calculation (min) 51 min    Activity Tolerance Patient tolerated treatment well    Behavior During Therapy [facility] for tasks assessed/performed              Past Medical History:  Diagnosis Date   Anxiety    Arthritis    "hands" (02/25/2014)   Borderline type 2 diabetes mellitus    Breast cancer, right breast ([place]) 1999   recurrence 08/2017   COVID    March 2021 and early 2022   GERD (gastroesophageal reflux disease)    High cholesterol    "can't take the RX; makes my bones ache" (02/25/2014)   History of hiatal hernia    History of stomach ulcers    Hypertension    Migraines    hx   Pneumonia    PONV (postoperative nausea and vomiting)    at last colonoscopy she was told she was hard to awaken   Shortness of breath dyspnea    w/ exertion    Past Surgical History:  Procedure Laterality Date   APPENDECTOMY     BREAST BIOPSY Right 1999   BREAST LUMPECTOMY Right 1999   BRONCHIAL BIOPSY  12/29/2020   Procedure: BRONCHIAL BIOPSIES;  Surgeon: Collene Gobble, MD;  Location: [facility] ENDOSCOPY;  Service: Pulmonary;;   BRONCHIAL BRUSHINGS  12/29/2020   Procedure: BRONCHIAL BRUSHINGS;  Surgeon: Collene Gobble, MD;  Location: [facility] ENDOSCOPY;  Service: Pulmonary;;   BRONCHIAL NEEDLE ASPIRATION BIOPSY  12/29/2020   Procedure: BRONCHIAL NEEDLE ASPIRATION BIOPSIES;  Surgeon: Collene Gobble, MD;  Location: [facility] ENDOSCOPY;  Service: Pulmonary;;   BRONCHIAL WASHINGS  12/29/2020   Procedure: BRONCHIAL WASHINGS;  Surgeon: Collene Gobble, MD;  Location: [facility] ENDOSCOPY;  Service: Pulmonary;;   DILATION AND CURETTAGE OF UTERUS     FINE NEEDLE ASPIRATION  12/29/2020    Procedure: FINE NEEDLE ASPIRATION (FNA) LINEAR;  Surgeon: Collene Gobble, MD;  Location: [facility] ENDOSCOPY;  Service: Pulmonary;;   PARTIAL KNEE ARTHROPLASTY Left 02/25/2014   Procedure: LEFT UNICOMPARTMENTAL (medial compartment) KNEE;  Surgeon: Vickey Huger, MD;  Location: [place];  Service: Orthopedics;  Laterality: Left;   REPLACEMENT UNICONDYLAR JOINT KNEE Left 02/25/2014   TONSILLECTOMY     VAGINAL HYSTERECTOMY     VIDEO BRONCHOSCOPY WITH ENDOBRONCHIAL ULTRASOUND N/A 12/29/2020   Procedure: VIDEO BRONCHOSCOPY WITH ENDOBRONCHIAL ULTRASOUND;  Surgeon: Collene Gobble, MD;  Location: [place] ENDOSCOPY;  Service: Pulmonary;  Laterality: N/A;   VIDEO BRONCHOSCOPY WITH RADIAL ENDOBRONCHIAL ULTRASOUND  12/29/2020   Procedure: RADIAL ENDOBRONCHIAL ULTRASOUND;  Surgeon: Collene Gobble, MD;  Location: [facility] ENDOSCOPY;  Service: Pulmonary;;   Patient Active Problem List   Diagnosis Date Noted   Abnormal CT of the chest 12/18/2020   Malignant neoplasm of upper-outer quadrant of right breast in female, estrogen receptor positive ([place]) 08/14/2019   S/P left unicompartmental knee replacement 02/25/2014    PCP: [place] PROVIDER: Nicholas Lose  REFERRING DIAG: C50.411,Z17.0 (ICD-10-CM) - Malignant neoplasm of upper-outer quadrant of right breast in female, estrogen receptor positive ([place])  THERAPY DIAG:  Lymphedema, not elsewhere classified  Stiffness  of right shoulder, not elsewhere classified  Malignant neoplasm of upper-outer quadrant of right female breast, unspecified estrogen receptor status ([place])  ONSET DATE: 03/11/21  Rationale for Evaluation and Treatment Rehabilitation  SUBJECTIVE                                                                                                                                                                                           SUBJECTIVE STATEMENT: The armpit still feels like a bruise. The bandages stayed up well.  PERTINENT HISTORY:  H/O  right breast cancer on 2017 and 1999: Lumpectomy.  (Bilateral mastectomies in 2017: could not tolerate antiestrogen therapy) Chest CT on 06/06/19 showed a mass in the right axilla and a mass in the right lower lung lobe. PET scan on 07/03/19 showed a right lower lobe pulmonary nodule concerning for malignancy, the right axillary mass consistent with local breast cancer recurrence, and small hypermetabolic right axillary and neck nodes consistent with metastatic adenopathy. Biopsy of the right axilla on 07/13/19 showed carcinoma consistent with breast carcinoma, HER-2 negative (1+), ER+ 95%, PR+ 75%, Ki67 25%. Currently taking Ibrance. Negative doppler.   PAIN:  Are you having pain? No   PRECAUTIONS: Other: lymphedema RUE Hx of L TKA  WEIGHT BEARING RESTRICTIONS No  FALLS:  Has patient fallen in last 6 months? No  LIVING ENVIRONMENT: Lives with: lives with their daughter Lives in: House/apartment Stairs: No;  Has following equipment at home: Environmental consultant - 2 wheeled  OCCUPATION: retired  LEISURE: pt reports she does not exercise  HAND DOMINANCE : right   PRIOR LEVEL OF FUNCTION: Independent  PATIENT GOALS to make the swelling go away and make it feel better   OBJECTIVE  COGNITION:  Overall cognitive status: Within functional limits for tasks assessed   PALPATION: Thick edema palpable at olecranon through axilla   POSTURE: forward head and rounded shoulders  UPPER EXTREMITY AROM/PROM:  A/PROM RIGHT   eval   Shoulder extension 56  Shoulder flexion 132  Shoulder abduction 120  Shoulder internal rotation 36  Shoulder external rotation 78    (Blank rows = not tested)  A/PROM LEFT   eval  Shoulder extension 60  Shoulder flexion 141  Shoulder abduction 161  Shoulder internal rotation 46  Shoulder external rotation 88    (Blank rows = not tested)   LYMPHEDEMA ASSESSMENTS:   SURGERY TYPE/DATE: R lumpectomy in 1999, Bilateral mastectomies in 2017 [place]: 2 or 3 removed in 1999 CHEMOTHERAPY: did not do RADIATION:completed in 1999/2000 HORMONE TREATMENT: Ibrance INFECTIONS: none  LYMPHEDEMA ASSESSMENTS:   [name] RIGHT  eval 08/17/21 08/19/21 08/24/21 08/26/21  10 cm proximal to olecranon process 32.7 32 31.4 31.5 30.4  Olecranon process _0 25.6  10 cm proximal to ulnar styloid process 22.8 23 22.8 21.8 21.8  Just proximal to ulnar styloid process 15 15 14._1 Across hand at thumb web space 18 18 17.5 17.8 17.6  At base of 2nd digit 5.5 5.6 5.4 5.4 5.1  (Blank rows = not tested)  [place] LEFT  eval  10 cm proximal to olecranon process 28  Olecranon process 25  10 cm proximal to ulnar styloid process 20  Just proximal to ulnar styloid process 13.7  Across hand at thumb web space 17.8  At base of 2nd digit 5.5  (Blank rows = not tested)    TODAY'S TREATMENT  08/26/21: Manual Therapy In supine: Short neck, 5 diaphragmatic breaths, R inguinal nodes and establishment of Rt axillo-inguinal pathway, then Rt UE working proximal to distal, moving inner upper arm outwards and upwards, and doing both sides of forearms, spending extra time in any areas of fibrosis then retracing all steps  Compression bandaging: TG soft, elastomull to digits 1-5, artiflex from hand to axilla, 1 6cm bandage at hand, 1 8 cm bandage for "X" at elbow, and 1 10 cm bandage from wrist to axilla   08/24/21: Removed bandages and had pt wash her arm with soap and water. Remeasured circumferences and all circumferences are within 2 cm of the left side except for the upper arm but that is only 3 cm away. Proceed with educating pt about different types of commercially available compression garments including flat knit vs circular knit sleeves and night time garments. Measured pt for a flat knit [employer] sleeve and glove (size 1) and a [employer] Profile size 2 night time garment. Alight is covering her glove and night time garment which was ordered  today and pt covered her compression sleeve.  Compression bandaging: TG soft, elastomull to digits 1-5, artiflex from hand to axilla, 1 6cm bandage at hand, 1 8 cm bandage for "X" at elbow, and 1 10 cm bandage from wrist to axilla  08/21/21: Manual Therapy In supine: Short neck, 5 diaphragmatic breaths, R inguinal nodes and establishment of Rt axillo-inguinal pathway, then Rt UE working proximal to distal, moving inner upper arm outwards and upwards, and doing both sides of forearms, spending extra time in any areas of fibrosis then retracing all steps while reviewing with pt and daughter basic principles of MLD and basics of anatomy of lymphatic system P/ROM: In Supine after MLD to Rt shoulder into flexion and abduction to pts available end ROM with VCs to relax during Compression bandaging: TG soft, elastomull to digits 1-5, artiflex from hand to axilla, 1 6cm bandage at hand, 1 8 cm bandage for "X" at elbow, and 1 10 cm bandage from wrist to axilla  08/19/21- In supine: Short neck, 5 diaphragmatic breaths, R inguinal nodes and establishment of axilloinguinal pathway, then R UE working proximal to distal, moving inner upper arm outwards and upwards, and doing both sides of forearms, spending extra time in any areas of fibrosis then retracing all steps while educating pt in basic principles of MLD Compression bandaging: TG soft, elastomull to digits 1-5, artiflex from hand to axilla, 1 6cm bandage at hand, 1 8 cm bandage in herringbone fashion from wrist to axilla, 1 10 cm bandage from wrist to axilla  08/17/21 - Education on options regarding decongestion including bandages or velcro with demo and images of  each option as well as cost breakdown.  Also showed pt ultimate goal of day and night garments and cost of these.  Pt is willing to attempt bandaging and daughter Levada Dy is also willing to learn so that pt feels more freedom with bandaging.  PT applied bandage with vcs and education for daughter, then  daughter applied bandage with cueing and direction and then PT reapplied the bandage one more time for pt to wear home.  Daughter given a link to video and instruction to return with all supplies.    PATIENT EDUCATION:  Education details: anatomy and physiology of the lymphatic system, how lymphedema is managed, compression bandaging Person educated: Patient and Child(ren) Education method: Explanation Education comprehension: verbalized understanding   HOME EXERCISE PROGRAM: Daughter will attempt bandaging:  Has link to " Lymphedema Management: Caregiver Bandaging you arm"  on youtube  ASSESSMENT CLINICAL IMPRESSION: Pt returns today with bandages intact. Bandages stayed up well. Used small size TG soft this last time and her measurements went down throughout especially at upper arm so used size small again today. Pt is awaiting arrival of her compression garments.   OBJECTIVE IMPAIRMENTS decreased ROM, increased edema, increased fascial restrictions, postural dysfunction, and pain.   ACTIVITY LIMITATIONS carrying and lifting  PARTICIPATION LIMITATIONS:  none  PERSONAL FACTORS Time since onset of injury/illness/exacerbation are also affecting patient's functional outcome.   REHAB POTENTIAL: Good  CLINICAL DECISION MAKING: Stable/uncomplicated  EVALUATION COMPLEXITY: Low  GOALS: Goals reviewed with patient? Yes  SHORT TERM GOALS = LONG TERM GOALS Target date: 09/02/2021    Pt will be independent in self MLD for long term management of lymphedema.  Baseline: no knowledge Goal status: INITIAL  2.  Pt will demonstrate a 4 cm decrease in lymphedema at 10 cm proximal to olecranon to decrease risk of edema.  Baseline: 32.7 cm Goal status: INITIAL  3.  Pt will obtain appropriate compression garments for long term management of lymphedema.  Baseline: none Goal status: INITIAL  4.  Pt will improve R shoulder abduction to 150 degrees to allow her to reach out to the side.   Baseline: 120 degrees Goal status: INITIAL  5.  Pt will report she is able to sleep through the night without being awoken by pain in R arm.  Baseline: wakes up due to pain in arm Goal status: INITIAL PLAN: PT FREQUENCY: 3x/week  PT DURATION: 6 weeks beginning Aug 7th due to scheduling  PLANNED INTERVENTIONS: Therapeutic exercises, Therapeutic activity, Patient/Family education, Self Care, Joint mobilization, Orthotic/Fit training, Manual lymph drainage, Compression bandaging, scar mobilization, Vasopneumatic device, and Manual therapy  PLAN FOR NEXT SESSION:  Review with daughter as needed - include MLD, PROM to R shoulder as tolerated, did pt get garments?   [employer], PT 08/26/2021, 11:03 AM   PLEASE KEEP YOUR BANDAGES ON AS LONG AS POSSIBLE TO GET THE BEST SWELLING REDUCTION. Should your bandages become uncomfortable or feel too tight, follow these steps: Elevate your extremity higher than your heart.  Try to move your arm or leg joints against the firmness of the bandage to help with moving the fluid and allow the bandages to loosen a bit.  If the bandaging is still is too tight, it is ok to carefully remove the top layer.  There will still be more layers under it that can provide compression to your extremity. Finally, if you STILL have significant pain after trying these steps, it is ok to take the bandage off.  Check your  skin carefully for any signs of irritation  PLEASE bring ALL bandage materials back to your next appointment as we will reuse what we can TAKE CARE OF YOUR BANDAGES SO THEY WILL LAST LONGER AND STAY IN BETTER CONDITION Washing bandages:  Wash periodically using a mild detergent in warm water.  Do not use fabric softener or bleach.  Place bandages in a mesh lingerie bag or in a tied off pillow case and use the gentle cycle of the washing machine or hand wash. If you hand wash, you may want to put them in the spin cycle of your washer to get the extra  water out, but make sure you put them in a mesh bag first. Do not wring or stretch them while they are wet.  Drying bandages: Lay the bandages out smoothly on a towel away from direct sunlight or heating sources that can damage the fabric. Rolling bandages in a towel and gently squeezing the towel to remove excess water before laying them out can speed up the process.  If you use a drying rack, place a towel on top of the rack to lay the bandages on.  If they hang down to dry, they fabric could be stretched out and the bandage will lose its compression.   Or, keep bandages in the mesh bag and dry them in the dryer on the low or no heat cycle. Rolling bandages: Please roll your bandages after drying them so they are ready for your next treatment. If they are rolled too loose, they will be difficult to apply.  If rolled too tight, they can get stretched out.   TAKE CARE OF YOUR SKIN Apply a low pH moisturizing lotion to your skin daily Avoid scratching your skin Treat skin irritations quickly  Know the 5 warning signs of infection: redness, pain, warmth to touch, fever and increased swelling.  Call your physician immediately if you notice any of these signs of a possible infection.

## 2021-08-28 ENCOUNTER — Ambulatory Visit: Payer: Medicare HMO | Admitting: Physical Therapy

## 2021-08-28 ENCOUNTER — Encounter: Payer: Self-pay | Admitting: Physical Therapy

## 2021-08-28 DIAGNOSIS — C50411 Malignant neoplasm of upper-outer quadrant of right female breast: Secondary | ICD-10-CM

## 2021-08-28 DIAGNOSIS — I890 Lymphedema, not elsewhere classified: Secondary | ICD-10-CM

## 2021-08-28 DIAGNOSIS — M25611 Stiffness of right shoulder, not elsewhere classified: Secondary | ICD-10-CM

## 2021-08-28 NOTE — Therapy (Addendum)
OUTPATIENT PHYSICAL THERAPY ONCOLOGY TREATMENT  Patient Name: Audrey [name] MRN: [id number] DOB:02-12-1940, 81 y.o., female Today's Date: 08/28/2021   PT End of Session - 08/28/21 1141     Visit Number 7    Number of Visits 19    Date for PT Re-Evaluation 09/02/21    PT Start Time 1104    PT Stop Time 1135    PT Time Calculation (min) 31 min    Activity Tolerance Patient tolerated treatment well    Behavior During Therapy [facility] for tasks assessed/performed               Past Medical History:  Diagnosis Date   Anxiety    Arthritis    "hands" (02/25/2014)   Borderline type 2 diabetes mellitus    Breast cancer, right breast ([place]) 1999   recurrence 08/2017   COVID    March 2021 and early 2022   GERD (gastroesophageal reflux disease)    High cholesterol    "can't take the RX; makes my bones ache" (02/25/2014)   History of hiatal hernia    History of stomach ulcers    Hypertension    Migraines    hx   Pneumonia    PONV (postoperative nausea and vomiting)    at last colonoscopy she was told she was hard to awaken   Shortness of breath dyspnea    w/ exertion    Past Surgical History:  Procedure Laterality Date   APPENDECTOMY     BREAST BIOPSY Right 1999   BREAST LUMPECTOMY Right 1999   BRONCHIAL BIOPSY  12/29/2020   Procedure: BRONCHIAL BIOPSIES;  Surgeon: Collene Gobble, MD;  Location: [place];  Service: Pulmonary;;   BRONCHIAL BRUSHINGS  12/29/2020   Procedure: BRONCHIAL BRUSHINGS;  Surgeon: Collene Gobble, MD;  Location: [facility];  Service: Pulmonary;;   BRONCHIAL NEEDLE ASPIRATION BIOPSY  12/29/2020   Procedure: BRONCHIAL NEEDLE ASPIRATION BIOPSIES;  Surgeon: Collene Gobble, MD;  Location: [facility] ENDOSCOPY;  Service: Pulmonary;;   BRONCHIAL WASHINGS  12/29/2020   Procedure: BRONCHIAL WASHINGS;  Surgeon: Collene Gobble, MD;  Location: [facility]OS[facility];  Service: Pulmonary;;   DILATION AND CURETTAGE OF UTERUS     FINE NEEDLE ASPIRATION  12/29/2020    Procedure: FINE NEEDLE ASPIRATION (FNA) LINEAR;  Surgeon: Collene Gobble, MD;  Location: [facility] ENDOS[facility];  Service: Pulmonary;;   PARTIAL KNEE ARTHROPLASTY Left 02/25/2014   Procedure: LEFT UNICOMPARTMENTAL (medial compartment) KNEE;  Surgeon: Vickey Huger, MD;  Location: [place];  Service: Orthopedics;  Laterality: Left;   REPLACEMENT UNICONDYLAR JOINT KNEE Left 02/25/2014   TONSILLECTOMY     VAGINAL HYSTERECTOMY     VIDEO BRONCHOSCOPY WITH ENDOBRONCHIAL ULTRASOUND N/A 12/29/2020   Procedure: VIDEO BRONCHOSCOPY WITH ENDOBRONCHIAL ULTRASOUND;  Surgeon: Collene Gobble, MD;  Location: [place] [facility];  Service: Pulmonary;  Laterality: N/A;   VIDEO BRONCHOSCOPY WITH RADIAL ENDOBRONCHIAL ULTRASOUND  12/29/2020   Procedure: RADIAL ENDOBRONCHIAL ULTRASOUND;  Surgeon: Collene Gobble, MD;  Location: [facility];  Service: Pulmonary;;   Patient Active Problem List   Diagnosis Date Noted   Abnormal CT of the chest 12/18/2020   Malignant neoplasm of upper-outer quadrant of right breast in female, estrogen receptor positive ([place]) 08/14/2019   S/P left unicompartmental knee replacement 02/25/2014    PCP: [place] PROVIDER: Nicholas Lose  REFERRING DIAG: C50.411,Z17.0 (ICD-10-CM) - Malignant neoplasm of upper-outer quadrant of right breast in female, estrogen receptor positive ([place])  THERAPY DIAG:  Lymphedema, not elsewhere classified  Stiffness of right shoulder, not elsewhere classified  Malignant neoplasm of upper-outer quadrant of right female breast, unspecified estrogen receptor status ([place])  ONSET DATE: 03/11/21  Rationale for Evaluation and Treatment Rehabilitation  SUBJECTIVE                                                                                                                                                                                           SUBJECTIVE STATEMENT: I got my garments. They all came yesterday.   PERTINENT HISTORY:  H/O right breast cancer on  2017 and 1999: Lumpectomy.  (Bilateral mastectomies in 2017: could not tolerate antiestrogen therapy) Chest CT on 06/06/19 showed a mass in the right axilla and a mass in the right lower lung lobe. PET scan on 07/03/19 showed a right lower lobe pulmonary nodule concerning for malignancy, the right axillary mass consistent with local breast cancer recurrence, and small hypermetabolic right axillary and neck nodes consistent with metastatic adenopathy. Biopsy of the right axilla on 07/13/19 showed carcinoma consistent with breast carcinoma, HER-2 negative (1+), ER+ 95%, PR+ 75%, Ki67 25%. Currently taking Ibrance. Negative doppler.   PAIN:  Are you having pain? No   PRECAUTIONS: Other: lymphedema RUE Hx of L TKA  WEIGHT BEARING RESTRICTIONS No  FALLS:  Has patient fallen in last 6 months? No  LIVING ENVIRONMENT: Lives with: lives with their daughter Lives in: House/apartment Stairs: No;  Has following equipment at home: Environmental consultant - 2 wheeled  OCCUPATION: retired  LEISURE: pt reports she does not exercise  HAND DOMINANCE : right   PRIOR LEVEL OF FUNCTION: Independent  PATIENT GOALS to make the swelling go away and make it feel better   OBJECTIVE  COGNITION:  Overall cognitive status: Within functional limits for tasks assessed   PALPATION: Thick edema palpable at olecranon through axilla   POSTURE: forward head and rounded shoulders  UPPER EXTREMITY AROM/PROM:  A/PROM RIGHT   eval   Shoulder extension 56  Shoulder flexion 132  Shoulder abduction 120  Shoulder internal rotation 36  Shoulder external rotation 78    (Blank rows = not tested)  A/PROM LEFT   eval  Shoulder extension 60  Shoulder flexion 141  Shoulder abduction 161  Shoulder internal rotation 46  Shoulder external rotation 88    (Blank rows = not tested)   LYMPHEDEMA ASSESSMENTS:   SURGERY TYPE/DATE: R lumpectomy in 1999, Bilateral mastectomies in 2017 [employer]: 2 or 3 removed  in 1999 CHEMOTHERAPY: did not do RADIATION:completed in 1999/2000 HORMONE TREATMENT: Ibrance INFECTIONS: none  LYMPHEDEMA ASSESSMENTS:   [facility] RIGHT  eval 08/17/21 08/19/21 08/24/21 08/26/21  10  cm proximal to olecranon process 32.7 32 31.4 31.5 30.4  Olecranon process _0 25.6  10 cm proximal to ulnar styloid process 22.8 23 22.8 21.8 21.8  Just proximal to ulnar styloid process 15 15 14._1 Across hand at thumb web space 18 18 17.5 17.8 17.6  At base of 2nd digit 5.5 5.6 5.4 5.4 5.1  (Blank rows = not tested)  [place] LEFT  eval  10 cm proximal to olecranon process 28  Olecranon process 25  10 cm proximal to ulnar styloid process 20  Just proximal to ulnar styloid process 13.7  Across hand at thumb web space 17.8  At base of 2nd digit 5.5  (Blank rows = not tested)    TODAY'S TREATMENT   08/28/21: Pt received her daytime and night time garment. Educated pt in proper donning and doffing for her garments and had pt return demonstrate to ensure she was able to independently don/doff. All garments fit well and had appropriate compression. Educated pt to wear daytime garment during waking hours and then don night time garment and to wear both daily to avoid an increase in edema. Also issued info on obtain donning gloves to assist with donning garments.   08/26/21: Manual Therapy In supine: Short neck, 5 diaphragmatic breaths, R inguinal nodes and establishment of Rt axillo-inguinal pathway, then Rt UE working proximal to distal, moving inner upper arm outwards and upwards, and doing both sides of forearms, spending extra time in any areas of fibrosis then retracing all steps  Compression bandaging: TG soft, elastomull to digits 1-5, artiflex from hand to axilla, 1 6cm bandage at hand, 1 8 cm bandage for "X" at elbow, and 1 10 cm bandage from wrist to axilla   08/24/21: Removed bandages and had pt wash her arm with soap and water. Remeasured circumferences and all  circumferences are within 2 cm of the left side except for the upper arm but that is only 3 cm away. Proceed with educating pt about different types of commercially available compression garments including flat knit vs circular knit sleeves and night time garments. Measured pt for a flat knit [employer] sleeve and glove (size 1) and a [employer] Profile size 2 night time garment. Alight is covering her glove and night time garment which was ordered today and pt covered her compression sleeve.  Compression bandaging: TG soft, elastomull to digits 1-5, artiflex from hand to axilla, 1 6cm bandage at hand, 1 8 cm bandage for "X" at elbow, and 1 10 cm bandage from wrist to axilla    PATIENT EDUCATION:  Education details: anatomy and physiology of the lymphatic system, how lymphedema is managed, compression bandaging Person educated: Patient and Child(ren) Education method: Explanation Education comprehension: verbalized understanding   HOME EXERCISE PROGRAM: Daughter will attempt bandaging:  Has link to " Lymphedema Management: Caregiver Bandaging you arm"  on youtube  ASSESSMENT CLINICAL IMPRESSION: Pt received her daytime and night time compression garments and was educated in donning/doffing today. She was able to do both independently. Both garments fit well. Educated pt to wear them 24/7 except when showering to keep her arm from swelling. Will assess on Monday to see if she had any issues.   OBJECTIVE IMPAIRMENTS decreased ROM, increased edema, increased fascial restrictions, postural dysfunction, and pain.   ACTIVITY LIMITATIONS carrying and lifting  PARTICIPATION LIMITATIONS:  none  PERSONAL FACTORS Time since onset of injury/illness/exacerbation are also affecting patient's functional outcome.   REHAB POTENTIAL:  Good  CLINICAL DECISION MAKING: Stable/uncomplicated  EVALUATION COMPLEXITY: Low  GOALS: Goals reviewed with patient? Yes  SHORT TERM GOALS = LONG TERM  GOALS Target date: 09/02/2021    Pt will be independent in self MLD for long term management of lymphedema.  Baseline: no knowledge Goal status: INITIAL  2.  Pt will demonstrate a 4 cm decrease in lymphedema at 10 cm proximal to olecranon to decrease risk of edema.  Baseline: 32.7 cm Goal status: INITIAL  3.  Pt will obtain appropriate compression garments for long term management of lymphedema.  Baseline: none Goal status: INITIAL  4.  Pt will improve R shoulder abduction to 150 degrees to allow her to reach out to the side.  Baseline: 120 degrees Goal status: INITIAL  5.  Pt will report she is able to sleep through the night without being awoken by pain in R arm.  Baseline: wakes up due to pain in arm Goal status: INITIAL PLAN: PT FREQUENCY: 3x/week  PT DURATION: 6 weeks beginning Aug 7th due to scheduling  PLANNED INTERVENTIONS: Therapeutic exercises, Therapeutic activity, Patient/Family education, Self Care, Joint mobilization, Orthotic/Fit training, Manual lymph drainage, Compression bandaging, scar mobilization, Vasopneumatic device, and Manual therapy  PLAN FOR NEXT SESSION:  how were garments?   [employer], PT 08/28/2021, 11:47 AM   PLEASE KEEP YOUR BANDAGES ON AS LONG AS POSSIBLE TO GET THE BEST SWELLING REDUCTION. Should your bandages become uncomfortable or feel too tight, follow these steps: Elevate your extremity higher than your heart.  Try to move your arm or leg joints against the firmness of the bandage to help with moving the fluid and allow the bandages to loosen a bit.  If the bandaging is still is too tight, it is ok to carefully remove the top layer.  There will still be more layers under it that can provide compression to your extremity. Finally, if you STILL have significant pain after trying these steps, it is ok to take the bandage off.  Check your skin carefully for any signs of irritation  PLEASE bring ALL bandage materials back to your  next appointment as we will reuse what we can TAKE CARE OF YOUR BANDAGES SO THEY WILL LAST LONGER AND STAY IN BETTER CONDITION Washing bandages:  Wash periodically using a mild detergent in warm water.  Do not use fabric softener or bleach.  Place bandages in a mesh lingerie bag or in a tied off pillow case and use the gentle cycle of the washing machine or hand wash. If you hand wash, you may want to put them in the spin cycle of your washer to get the extra water out, but make sure you put them in a mesh bag first. Do not wring or stretch them while they are wet.  Drying bandages: Lay the bandages out smoothly on a towel away from direct sunlight or heating sources that can damage the fabric. Rolling bandages in a towel and gently squeezing the towel to remove excess water before laying them out can speed up the process.  If you use a drying rack, place a towel on top of the rack to lay the bandages on.  If they hang down to dry, they fabric could be stretched out and the bandage will lose its compression.   Or, keep bandages in the mesh bag and dry them in the dryer on the low or no heat cycle. Rolling bandages: Please roll your bandages after drying them so they are ready for  your next treatment. If they are rolled too loose, they will be difficult to apply.  If rolled too tight, they can get stretched out.   TAKE CARE OF YOUR SKIN Apply a low pH moisturizing lotion to your skin daily Avoid scratching your skin Treat skin irritations quickly  Know the 5 warning signs of infection: redness, pain, warmth to touch, fever and increased swelling.  Call your physician immediately if you notice any of these signs of a possible infection.

## 2021-08-28 NOTE — Therapy (Signed)
OUTPATIENT PHYSICAL THERAPY ONCOLOGY TREATMENT  Patient Name: CIANNI MANNY MRN: [id number] DOB:02-12-1940, 81 y.o., female Today's Date: 08/28/2021   PT End of Session - 08/28/21 1141     Visit Number 7    Number of Visits 19    Date for PT Re-Evaluation 09/02/21    PT Start Time 1104    PT Stop Time 1135    PT Time Calculation (min) 31 min    Activity Tolerance Patient tolerated treatment well    Behavior During Therapy [facility] for tasks assessed/performed               Past Medical History:  Diagnosis Date   Anxiety    Arthritis    "hands" (02/25/2014)   Borderline type 2 diabetes mellitus    Breast cancer, right breast ([place]) 1999   recurrence 08/2017   COVID    March 2021 and early 2022   GERD (gastroesophageal reflux disease)    High cholesterol    "can't take the RX; makes my bones ache" (02/25/2014)   History of hiatal hernia    History of stomach ulcers    Hypertension    Migraines    hx   Pneumonia    PONV (postoperative nausea and vomiting)    at last colonoscopy she was told she was hard to awaken   Shortness of breath dyspnea    w/ exertion    Past Surgical History:  Procedure Laterality Date   APPENDECTOMY     BREAST BIOPSY Right 1999   BREAST LUMPECTOMY Right 1999   BRONCHIAL BIOPSY  12/29/2020   Procedure: BRONCHIAL BIOPSIES;  Surgeon: Collene Gobble, MD;  Location: [place];  Service: Pulmonary;;   BRONCHIAL BRUSHINGS  12/29/2020   Procedure: BRONCHIAL BRUSHINGS;  Surgeon: Collene Gobble, MD;  Location: [facility];  Service: Pulmonary;;   BRONCHIAL NEEDLE ASPIRATION BIOPSY  12/29/2020   Procedure: BRONCHIAL NEEDLE ASPIRATION BIOPSIES;  Surgeon: Collene Gobble, MD;  Location: [facility] ENDOSCOPY;  Service: Pulmonary;;   BRONCHIAL WASHINGS  12/29/2020   Procedure: BRONCHIAL WASHINGS;  Surgeon: Collene Gobble, MD;  Location: [facility]OS[facility];  Service: Pulmonary;;   DILATION AND CURETTAGE OF UTERUS     FINE NEEDLE ASPIRATION  12/29/2020    Procedure: FINE NEEDLE ASPIRATION (FNA) LINEAR;  Surgeon: Collene Gobble, MD;  Location: [facility] ENDOS[facility];  Service: Pulmonary;;   PARTIAL KNEE ARTHROPLASTY Left 02/25/2014   Procedure: LEFT UNICOMPARTMENTAL (medial compartment) KNEE;  Surgeon: Vickey Huger, MD;  Location: [place];  Service: Orthopedics;  Laterality: Left;   REPLACEMENT UNICONDYLAR JOINT KNEE Left 02/25/2014   TONSILLECTOMY     VAGINAL HYSTERECTOMY     VIDEO BRONCHOSCOPY WITH ENDOBRONCHIAL ULTRASOUND N/A 12/29/2020   Procedure: VIDEO BRONCHOSCOPY WITH ENDOBRONCHIAL ULTRASOUND;  Surgeon: Collene Gobble, MD;  Location: [place] [facility];  Service: Pulmonary;  Laterality: N/A;   VIDEO BRONCHOSCOPY WITH RADIAL ENDOBRONCHIAL ULTRASOUND  12/29/2020   Procedure: RADIAL ENDOBRONCHIAL ULTRASOUND;  Surgeon: Collene Gobble, MD;  Location: [facility];  Service: Pulmonary;;   Patient Active Problem List   Diagnosis Date Noted   Abnormal CT of the chest 12/18/2020   Malignant neoplasm of upper-outer quadrant of right breast in female, estrogen receptor positive ([place]) 08/14/2019   S/P left unicompartmental knee replacement 02/25/2014    PCP: [place] PROVIDER: Nicholas Lose  REFERRING DIAG: C50.411,Z17.0 (ICD-10-CM) - Malignant neoplasm of upper-outer quadrant of right breast in female, estrogen receptor positive ([place])  THERAPY DIAG:  Lymphedema, not elsewhere classified  Stiffness of right shoulder, not elsewhere classified  Malignant neoplasm of upper-outer quadrant of right female breast, unspecified estrogen receptor status ([place])  ONSET DATE: 03/11/21  Rationale for Evaluation and Treatment Rehabilitation  SUBJECTIVE                                                                                                                                                                                           SUBJECTIVE STATEMENT: The fingers bothered me some with the bandaging. They felt like they were trying to swell.    PERTINENT HISTORY:  H/O right breast cancer on 2017 and 1999: Lumpectomy.  (Bilateral mastectomies in 2017: could not tolerate antiestrogen therapy) Chest CT on 06/06/19 showed a mass in the right axilla and a mass in the right lower lung lobe. PET scan on 07/03/19 showed a right lower lobe pulmonary nodule concerning for malignancy, the right axillary mass consistent with local breast cancer recurrence, and small hypermetabolic right axillary and neck nodes consistent with metastatic adenopathy. Biopsy of the right axilla on 07/13/19 showed carcinoma consistent with breast carcinoma, HER-2 negative (1+), ER+ 95%, PR+ 75%, Ki67 25%. Currently taking Ibrance. Negative doppler.   PAIN:  Are you having pain? No   PRECAUTIONS: Other: lymphedema RUE Hx of L TKA  WEIGHT BEARING RESTRICTIONS No  FALLS:  Has patient fallen in last 6 months? No  LIVING ENVIRONMENT: Lives with: lives with their daughter Lives in: House/apartment Stairs: No;  Has following equipment at home: Environmental consultant - 2 wheeled  OCCUPATION: retired  LEISURE: pt reports she does not exercise  HAND DOMINANCE : right   PRIOR LEVEL OF FUNCTION: Independent  PATIENT GOALS to make the swelling go away and make it feel better   OBJECTIVE  COGNITION:  Overall cognitive status: Within functional limits for tasks assessed   PALPATION: Thick edema palpable at olecranon through axilla   POSTURE: forward head and rounded shoulders  UPPER EXTREMITY AROM/PROM:  A/PROM RIGHT   eval   Shoulder extension 56  Shoulder flexion 132  Shoulder abduction 120  Shoulder internal rotation 36  Shoulder external rotation 78    (Blank rows = not tested)  A/PROM LEFT   eval  Shoulder extension 60  Shoulder flexion 141  Shoulder abduction 161  Shoulder internal rotation 46  Shoulder external rotation 88    (Blank rows = not tested)   LYMPHEDEMA ASSESSMENTS:   SURGERY TYPE/DATE: R lumpectomy in 1999, Bilateral mastectomies in  2017 [place]: 2 or 3 removed in 1999 CHEMOTHERAPY: did not do RADIATION:completed in 1999/2000 HORMONE TREATMENT: Ibrance INFECTIONS: none  LYMPHEDEMA ASSESSMENTS:   LANDMARK RIGHT  eval 08/17/21 08/19/21 08/24/21 08/26/21 08/28/21  10 cm proximal to olecranon process 32.7 32 31.4 31.5 30.4 30.5  Olecranon process $RemoveBeforeDEI'28 28 26 26 'mecWIVTBBdeztCVs$ 25.6 25.6  10 cm proximal to ulnar styloid process 22.8 23 22.8 21.8 21.8 22.4  Just proximal to ulnar styloid process 15 15 14.$Remove'6 15 15 'LDQtYcP$ 14.5  Across hand at thumb web space 18 18 17.5 17.8 17.6 17.3  At base of 2nd digit 5.5 5.6 5.4 5.4 5.1 5.2  (Blank rows = not tested)  LANDMARK LEFT  eval  10 cm proximal to olecranon process 28  Olecranon process 25  10 cm proximal to ulnar styloid process 20  Just proximal to ulnar styloid process 13.7  Across hand at thumb web space 17.8  At base of 2nd digit 5.5  (Blank rows = not tested)    TODAY'S TREATMENT  08/26/21: Manual Therapy In supine: Short neck, 5 diaphragmatic breaths, R inguinal nodes and establishment of Rt axillo-inguinal pathway, then Rt UE working proximal to distal, moving inner upper arm outwards and upwards, and doing both sides of forearms, spending extra time in any areas of fibrosis then retracing all steps  Compression bandaging: TG soft, elastomull to digits 1-5, artiflex from hand to axilla, 1 6cm bandage at hand, 1 8 cm bandage for "X" at elbow, and 1 10 cm bandage from wrist to axilla   08/24/21: Removed bandages and had pt wash her arm with soap and water. Remeasured circumferences and all circumferences are within 2 cm of the left side except for the upper arm but that is only 3 cm away. Proceed with educating pt about different types of commercially available compression garments including flat knit vs circular knit sleeves and night time garments. Measured pt for a flat knit [employer] sleeve and glove (size 1) and a [employer] Profile size 2 night time garment.  Alight is covering her glove and night time garment which was ordered today and pt covered her compression sleeve.  Compression bandaging: TG soft, elastomull to digits 1-5, artiflex from hand to axilla, 1 6cm bandage at hand, 1 8 cm bandage for "X" at elbow, and 1 10 cm bandage from wrist to axilla  08/21/21: Manual Therapy In supine: Short neck, 5 diaphragmatic breaths, R inguinal nodes and establishment of Rt axillo-inguinal pathway, then Rt UE working proximal to distal, moving inner upper arm outwards and upwards, and doing both sides of forearms, spending extra time in any areas of fibrosis then retracing all steps while reviewing with pt and daughter basic principles of MLD and basics of anatomy of lymphatic system P/ROM: In Supine after MLD to Rt shoulder into flexion and abduction to pts available end ROM with VCs to relax during Compression bandaging: TG soft, elastomull to digits 1-5, artiflex from hand to axilla, 1 6cm bandage at hand, 1 8 cm bandage for "X" at elbow, and 1 10 cm bandage from wrist to axilla  08/19/21- In supine: Short neck, 5 diaphragmatic breaths, R inguinal nodes and establishment of axilloinguinal pathway, then R UE working proximal to distal, moving inner upper arm outwards and upwards, and doing both sides of forearms, spending extra time in any areas of fibrosis then retracing all steps while educating pt in basic principles of MLD Compression bandaging: TG soft, elastomull to digits 1-5, artiflex from hand to axilla, 1 6cm bandage at hand, 1 8 cm bandage in herringbone fashion from wrist to axilla, 1 10 cm bandage from wrist to axilla  08/17/21 - Education  on options regarding decongestion including bandages or velcro with demo and images of each option as well as cost breakdown.  Also showed pt ultimate goal of day and night garments and cost of these.  Pt is willing to attempt bandaging and daughter Levada Dy is also willing to learn so that pt feels more freedom with  bandaging.  PT applied bandage with vcs and education for daughter, then daughter applied bandage with cueing and direction and then PT reapplied the bandage one more time for pt to wear home.  Daughter given a link to video and instruction to return with all supplies.    PATIENT EDUCATION:  Education details: anatomy and physiology of the lymphatic system, how lymphedema is managed, compression bandaging Person educated: Patient and Child(ren) Education method: Explanation Education comprehension: verbalized understanding   HOME EXERCISE PROGRAM: Daughter will attempt bandaging:  Has link to " Lymphedema Management: Caregiver Bandaging you arm"  on youtube  ASSESSMENT CLINICAL IMPRESSION: Pt returns today with bandages intact. Bandages stayed up well. Used small size TG soft this last time and her measurements went down throughout especially at upper arm so used size small again today. Pt is awaiting arrival of her compression garments.   OBJECTIVE IMPAIRMENTS decreased ROM, increased edema, increased fascial restrictions, postural dysfunction, and pain.   ACTIVITY LIMITATIONS carrying and lifting  PARTICIPATION LIMITATIONS:  none  PERSONAL FACTORS Time since onset of injury/illness/exacerbation are also affecting patient's functional outcome.   REHAB POTENTIAL: Good  CLINICAL DECISION MAKING: Stable/uncomplicated  EVALUATION COMPLEXITY: Low  GOALS: Goals reviewed with patient? Yes  SHORT TERM GOALS = LONG TERM GOALS Target date: 09/02/2021    Pt will be independent in self MLD for long term management of lymphedema.  Baseline: no knowledge Goal status: INITIAL  2.  Pt will demonstrate a 4 cm decrease in lymphedema at 10 cm proximal to olecranon to decrease risk of edema.  Baseline: 32.7 cm Goal status: INITIAL  3.  Pt will obtain appropriate compression garments for long term management of lymphedema.  Baseline: none Goal status: INITIAL  4.  Pt will improve R  shoulder abduction to 150 degrees to allow her to reach out to the side.  Baseline: 120 degrees Goal status: INITIAL  5.  Pt will report she is able to sleep through the night without being awoken by pain in R arm.  Baseline: wakes up due to pain in arm Goal status: INITIAL PLAN: PT FREQUENCY: 3x/week  PT DURATION: 6 weeks beginning Aug 7th due to scheduling  PLANNED INTERVENTIONS: Therapeutic exercises, Therapeutic activity, Patient/Family education, Self Care, Joint mobilization, Orthotic/Fit training, Manual lymph drainage, Compression bandaging, scar mobilization, Vasopneumatic device, and Manual therapy  PLAN FOR NEXT SESSION:  Review with daughter as needed - include MLD, PROM to R shoulder as tolerated, did pt get garments?   [employer], PT 08/28/2021, 11:48 AM   PLEASE KEEP YOUR BANDAGES ON AS LONG AS POSSIBLE TO GET THE BEST SWELLING REDUCTION. Should your bandages become uncomfortable or feel too tight, follow these steps: Elevate your extremity higher than your heart.  Try to move your arm or leg joints against the firmness of the bandage to help with moving the fluid and allow the bandages to loosen a bit.  If the bandaging is still is too tight, it is ok to carefully remove the top layer.  There will still be more layers under it that can provide compression to your extremity. Finally, if you STILL have significant pain after trying  these steps, it is ok to take the bandage off.  Check your skin carefully for any signs of irritation  PLEASE bring ALL bandage materials back to your next appointment as we will reuse what we can TAKE CARE OF YOUR BANDAGES SO THEY WILL LAST LONGER AND STAY IN BETTER CONDITION Washing bandages:  Wash periodically using a mild detergent in warm water.  Do not use fabric softener or bleach.  Place bandages in a mesh lingerie bag or in a tied off pillow case and use the gentle cycle of the washing machine or hand wash. If you hand wash, you  may want to put them in the spin cycle of your washer to get the extra water out, but make sure you put them in a mesh bag first. Do not wring or stretch them while they are wet.  Drying bandages: Lay the bandages out smoothly on a towel away from direct sunlight or heating sources that can damage the fabric. Rolling bandages in a towel and gently squeezing the towel to remove excess water before laying them out can speed up the process.  If you use a drying rack, place a towel on top of the rack to lay the bandages on.  If they hang down to dry, they fabric could be stretched out and the bandage will lose its compression.   Or, keep bandages in the mesh bag and dry them in the dryer on the low or no heat cycle. Rolling bandages: Please roll your bandages after drying them so they are ready for your next treatment. If they are rolled too loose, they will be difficult to apply.  If rolled too tight, they can get stretched out.   TAKE CARE OF YOUR SKIN Apply a low pH moisturizing lotion to your skin daily Avoid scratching your skin Treat skin irritations quickly  Know the 5 warning signs of infection: redness, pain, warmth to touch, fever and increased swelling.  Call your physician immediately if you notice any of these signs of a possible infection.

## 2021-08-31 ENCOUNTER — Encounter: Payer: Self-pay | Admitting: Physical Therapy

## 2021-08-31 ENCOUNTER — Ambulatory Visit: Payer: Medicare HMO | Admitting: Physical Therapy

## 2021-08-31 DIAGNOSIS — I890 Lymphedema, not elsewhere classified: Secondary | ICD-10-CM

## 2021-08-31 DIAGNOSIS — C50411 Malignant neoplasm of upper-outer quadrant of right female breast: Secondary | ICD-10-CM

## 2021-08-31 DIAGNOSIS — M25611 Stiffness of right shoulder, not elsewhere classified: Secondary | ICD-10-CM

## 2021-08-31 NOTE — Therapy (Signed)
OUTPATIENT PHYSICAL THERAPY ONCOLOGY TREATMENT  Patient Name: Audrey [name] MRN: [id number] DOB:[birth year], 81 y.o., female Today's Date: 08/31/2021   PT End of Session - 08/31/21 1106     Visit Number 8    Number of Visits 19    Date for PT Re-Evaluation 09/02/21    PT Start Time 1104    PT Stop Time 1141    PT Time Calculation (min) 37 min    Activity Tolerance Patient tolerated treatment well    Behavior During Therapy [id number] for tasks assessed/performed               Past Medical History:  Diagnosis Date   Anxiety    Arthritis    "hands" (02/25/2014)   Borderline type 2 diabetes mellitus    Breast cancer, right breast ([place]) 1999   recurrence 08/2017   COVID    March 2021 and early 2022   GERD (gastroesophageal reflux disease)    High cholesterol    "can't take the RX; makes my bones ache" (02/25/2014)   History of hiatal hernia    History of stomach ulcers    Hypertension    Migraines    hx   Pneumonia    PONV (postoperative nausea and vomiting)    at last colonoscopy she was told she was hard to awaken   Shortness of breath dyspnea    w/ exertion    Past Surgical History:  Procedure Laterality Date   APPENDECTOMY     BREAST BIOPSY Right 1999   BREAST LUMPECTOMY Right 1999   BRONCHIAL BIOPSY  12/29/2020   Procedure: BRONCHIAL BIOPSIES;  Surgeon: Collene Gobble, MD;  Location: [facility] ENDOSCOPY;  Service: Pulmonary;;   BRONCHIAL BRUSHINGS  12/29/2020   Procedure: BRONCHIAL BRUSHINGS;  Surgeon: Collene Gobble, MD;  Location: [facility] ENDOSCOPY;  Service: Pulmonary;;   BRONCHIAL NEEDLE ASPIRATION BIOPSY  12/29/2020   Procedure: BRONCHIAL NEEDLE ASPIRATION BIOPSIES;  Surgeon: Collene Gobble, MD;  Location: [facility] ENDOSCOPY;  Service: Pulmonary;;   BRONCHIAL WASHINGS  12/29/2020   Procedure: BRONCHIAL WASHINGS;  Surgeon: Collene Gobble, MD;  Location: [facility] ENDOSCOPY;  Service: Pulmonary;;   DILATION AND CURETTAGE OF UTERUS     FINE NEEDLE ASPIRATION  12/29/2020    Procedure: FINE NEEDLE ASPIRATION (FNA) LINEAR;  Surgeon: Collene Gobble, MD;  Location: [facility] ENDOSCOPY;  Service: Pulmonary;;   PARTIAL KNEE ARTHROPLASTY Left 02/25/2014   Procedure: LEFT UNICOMPARTMENTAL (medial compartment) KNEE;  Surgeon: Vickey Huger, MD;  Location: [place];  Service: Orthopedics;  Laterality: Left;   REPLACEMENT UNICONDYLAR JOINT KNEE Left 02/25/2014   TONSILLECTOMY     VAGINAL HYSTERECTOMY     VIDEO BRONCHOSCOPY WITH ENDOBRONCHIAL ULTRASOUND N/A 12/29/2020   Procedure: VIDEO BRONCHOSCOPY WITH ENDOBRONCHIAL ULTRASOUND;  Surgeon: Collene Gobble, MD;  Location: [place] ENDOSCOPY;  Service: Pulmonary;  Laterality: N/A;   VIDEO BRONCHOSCOPY WITH RADIAL ENDOBRONCHIAL ULTRASOUND  12/29/2020   Procedure: RADIAL ENDOBRONCHIAL ULTRASOUND;  Surgeon: Collene Gobble, MD;  Location: [facility] ENDOSCOPY;  Service: Pulmonary;;   Patient Active Problem List   Diagnosis Date Noted   Abnormal CT of the chest 12/18/2020   Malignant neoplasm of upper-outer quadrant of right breast in female, estrogen receptor positive ([place]) 08/14/2019   S/P left unicompartmental knee replacement 02/25/2014    PCP: [place] PROVIDER: Nicholas Lose  REFERRING DIAG: C50.411,Z17.0 (ICD-10-CM) - Malignant neoplasm of upper-outer quadrant of right breast in female, estrogen receptor positive ([place])  THERAPY DIAG:  Lymphedema, not elsewhere classified  Stiffness of right shoulder, not elsewhere classified  Malignant neoplasm of upper-outer quadrant of right female breast, unspecified estrogen receptor status ([place])  ONSET DATE: 03/11/21  Rationale for Evaluation and Treatment Rehabilitation  SUBJECTIVE                                                                                                                                                                                           SUBJECTIVE STATEMENT: I feel like the glove is a little too tight. My fingertips feel like they loose circulation.    PERTINENT HISTORY:  H/O right breast cancer on 2017 and 1999: Lumpectomy.  (Bilateral mastectomies in 2017: could not tolerate antiestrogen therapy) Chest CT on 06/06/19 showed a mass in the right axilla and a mass in the right lower lung lobe. PET scan on 07/03/19 showed a right lower lobe pulmonary nodule concerning for malignancy, the right axillary mass consistent with local breast cancer recurrence, and small hypermetabolic right axillary and neck nodes consistent with metastatic adenopathy. Biopsy of the right axilla on 07/13/19 showed carcinoma consistent with breast carcinoma, HER-2 negative (1+), ER+ 95%, PR+ 75%, Ki67 25%. Currently taking Ibrance. Negative doppler.   PAIN:  Are you having pain? No   PRECAUTIONS: Other: lymphedema RUE Hx of L TKA  WEIGHT BEARING RESTRICTIONS No  FALLS:  Has patient fallen in last 6 months? No  LIVING ENVIRONMENT: Lives with: lives with their daughter Lives in: House/apartment Stairs: No;  Has following equipment at home: Environmental consultant - 2 wheeled  OCCUPATION: retired  LEISURE: pt reports she does not exercise  HAND DOMINANCE : right   PRIOR LEVEL OF FUNCTION: Independent  PATIENT GOALS to make the swelling go away and make it feel better   OBJECTIVE  COGNITION:  Overall cognitive status: Within functional limits for tasks assessed   PALPATION: Thick edema palpable at olecranon through axilla   POSTURE: forward head and rounded shoulders  UPPER EXTREMITY AROM/PROM:  A/PROM RIGHT   eval   Shoulder extension 56  Shoulder flexion 132  Shoulder abduction 120  Shoulder internal rotation 36  Shoulder external rotation 78    (Blank rows = not tested)  A/PROM LEFT   eval  Shoulder extension 60  Shoulder flexion 141  Shoulder abduction 161  Shoulder internal rotation 46  Shoulder external rotation 88    (Blank rows = not tested)   LYMPHEDEMA ASSESSMENTS:   SURGERY TYPE/DATE: R lumpectomy in 1999, Bilateral mastectomies in  2017 [place]: 2 or 3 removed in 1999 CHEMOTHERAPY: did not do RADIATION:completed in 1999/2000 HORMONE TREATMENT: Ibrance INFECTIONS: none  LYMPHEDEMA ASSESSMENTS:   [name]  RIGHT  eval 08/17/21 08/19/21 08/24/21 08/26/21 08/31/21  10 cm proximal to olecranon process 32.7 32 31.4 31.5 30.4 30.7  Olecranon process 28 28 26 26  25.6 26  10  cm proximal to ulnar styloid process 22.8 23 22.8 21.8 21.8 21.6  Just proximal to ulnar styloid process 15 15 14.6 15 15 15   Across hand at thumb web space 18 18 17.5 17.8 17.6 18.1  At base of 2nd digit 5.5 5.6 5.4 5.4 5.1 5.5  (Blank rows = not tested)  LANDMARK LEFT  eval  10 cm proximal to olecranon process 28  Olecranon process 25  10 cm proximal to ulnar styloid process 20  Just proximal to ulnar styloid process 13.7  Across hand at thumb web space 17.8  At base of 2nd digit 5.5  (Blank rows = not tested)    TODAY'S TREATMENT   08/31/21: Assessed pt's garments for fit. Pt reports the fingers of her glove are tight so therapist remeasured and her glove is the appropriate size. Educated pt to put markers in her glove fingers when she is not wearing it to help stretch them out some. Educated pt on the availability of the FlexiTouch compression pump for long term management of edema. Issued info on this and pt requests demographics be sent to [employer] to check for insurance coverage. Instruct pt in self MLD today and had pt return demonstrate entire sequence as follows: In sitting: Short neck, 5 diaphragmatic breaths, R inguinal nodes and establishment of Rt axillo-inguinal pathway, then Rt UE working proximal to distal, moving inner upper arm outwards and upwards, and doing both sides of forearms, spending extra time in any areas of fibrosis then retracing all steps. Pt required mod/max v/c and t/c for proper skin stretch, pressure and speed. Issued handout for pt to practice at home.   08/28/21: Pt received her daytime and night  time garment. Educated pt in proper donning and doffing for her garments and had pt return demonstrate to ensure she was able to independently don/doff. All garments fit well and had appropriate compression. Educated pt to wear daytime garment during waking hours and then don night time garment and to wear both daily to avoid an increase in edema. Also issued info on obtain donning gloves to assist with donning garments.   08/26/21: Manual Therapy In supine: Short neck, 5 diaphragmatic breaths, R inguinal nodes and establishment of Rt axillo-inguinal pathway, then Rt UE working proximal to distal, moving inner upper arm outwards and upwards, and doing both sides of forearms, spending extra time in any areas of fibrosis then retracing all steps  Compression bandaging: TG soft, elastomull to digits 1-5, artiflex from hand to axilla, 1 6cm bandage at hand, 1 8 cm bandage for "X" at elbow, and 1 10 cm bandage from wrist to axilla   08/24/21: Removed bandages and had pt wash her arm with soap and water. Remeasured circumferences and all circumferences are within 2 cm of the left side except for the upper arm but that is only 3 cm away. Proceed with educating pt about different types of commercially available compression garments including flat knit vs circular knit sleeves and night time garments. Measured pt for a flat knit [employer] sleeve and glove (size 1) and a CIrcAid Profile size 2 night time garment. Alight is covering her glove and night time garment which was ordered today and pt covered her compression sleeve.  Compression bandaging: TG soft, elastomull to digits 1-5, artiflex from hand  to axilla, 1 6cm bandage at hand, 1 8 cm bandage for "X" at elbow, and 1 10 cm bandage from wrist to axilla    PATIENT EDUCATION:  Education details: anatomy and physiology of the lymphatic system, how lymphedema is managed, compression bandaging Person educated: Patient and Child(ren) Education method:  Explanation Education comprehension: verbalized understanding   HOME EXERCISE PROGRAM: Daughter will attempt bandaging:  Has link to " Lymphedema Management: Caregiver Bandaging you arm"  on youtube  ASSESSMENT CLINICAL IMPRESSION: Pt has been wearing her day time sleeve and glove and her night time circaid profile since last session. She reports some difficulty sleeping with the night time garment and reports her glove is tight in the fingers. Educated pt about placing markers in the fingers to help stretch them. Began instructing pt in self MLD with pt requiring frequent verbal and tactile cues to perform correctly. Will check insurance for coverage for FlexiTouch.   OBJECTIVE IMPAIRMENTS decreased ROM, increased edema, increased fascial restrictions, postural dysfunction, and pain.   ACTIVITY LIMITATIONS carrying and lifting  PARTICIPATION LIMITATIONS:  none  PERSONAL FACTORS Time since onset of injury/illness/exacerbation are also affecting patient's functional outcome.   REHAB POTENTIAL: Good  CLINICAL DECISION MAKING: Stable/uncomplicated  EVALUATION COMPLEXITY: Low  GOALS: Goals reviewed with patient? Yes  SHORT TERM GOALS = LONG TERM GOALS Target date: 09/02/2021    Pt will be independent in self MLD for long term management of lymphedema.  Baseline: no knowledge Goal status: INITIAL  2.  Pt will demonstrate a 4 cm decrease in lymphedema at 10 cm proximal to olecranon to decrease risk of edema.  Baseline: 32.7 cm Goal status: INITIAL  3.  Pt will obtain appropriate compression garments for long term management of lymphedema.  Baseline: none Goal status: INITIAL  4.  Pt will improve R shoulder abduction to 150 degrees to allow her to reach out to the side.  Baseline: 120 degrees Goal status: INITIAL  5.  Pt will report she is able to sleep through the night without being awoken by pain in R arm.  Baseline: wakes up due to pain in arm Goal status:  INITIAL PLAN: PT FREQUENCY: 3x/week  PT DURATION: 6 weeks beginning Aug 7th due to scheduling  PLANNED INTERVENTIONS: Therapeutic exercises, Therapeutic activity, Patient/Family education, Self Care, Joint mobilization, Orthotic/Fit training, Manual lymph drainage, Compression bandaging, scar mobilization, Vasopneumatic device, and Manual therapy  PLAN FOR NEXT SESSION: update POC, how were garments and self MLD, coverage for Flexi?   [facility] [place], PT 08/31/2021, 11:53 AM   Deep Effective Breath   Standing, sitting, or laying down, place both hands on the belly. Take a deep breath IN, expanding the belly; then breath OUT, contracting the belly. Repeat __5__ times. Do __2-3__ sessions per day and before your self massage.  http://gt2.exer.us/866   Copyright  VHI. All rights reserved.  Axilla to Axilla - Sweep   On uninvolved side make 5 circles in the armpit, then pump _5__ times from involved armpit across chest to uninvolved armpit, making a pathway. Do _1__ time per day.  Copyright  VHI. All rights reserved.  Axilla to Inguinal Nodes - Sweep   On involved side, make 5 circles at groin at panty line, then pump _5__ times from armpit along side of trunk to outer hip, making your other pathway. Do __1_ time per day.  Copyright  VHI. All rights reserved.  Arm Posterior: Elbow to Shoulder - Sweep   Pump _5__ times from back of  elbow to top of shoulder. Then inner to outer upper arm _5_ times, then outer arm again _5_ times. Then back to the pathways _2-3_ times. Do _1__ time per day.  Copyright  VHI. All rights reserved.  ARM: Volar Wrist to Elbow - Sweep   Pump or stationary circles _5__ times from wrist to elbow making sure to do both sides of the forearm. Then retrace your steps to the outer arm, and the pathways _2-3_ times each. Do _1__ time per day.  Copyright  VHI. All rights reserved.  ARM: Dorsum of Hand to Shoulder - Sweep   Pump or stationary  circles _5__ times on back of hand including knuckle spaces and individual fingers if needed working up towards the wrist, then retrace all your steps working back up the forearm, doing both sides; upper outer arm and back to your pathways _2-3_ times each. Then do 5 circles again at uninvolved armpit and involved groin where you started! Good job!! Do __1_ time per day.  Copyright  VHI. All rights reserved.

## 2021-08-31 NOTE — Patient Instructions (Signed)
Deep Effective Breath   Standing, sitting, or laying down, place both hands on the belly. Take a deep breath IN, expanding the belly; then breath OUT, contracting the belly. Repeat __5__ times. Do __2-3__ sessions per day and before your self massage.  http://gt2.exer.us/866   Copyright  VHI. All rights reserved.  Axilla to Axilla - Sweep   On uninvolved side make 5 circles in the armpit, then pump _5__ times from involved armpit across chest to uninvolved armpit, making a pathway. Do _1__ time per day.  Copyright  VHI. All rights reserved.  Axilla to Inguinal Nodes - Sweep   On involved side, make 5 circles at groin at panty line, then pump _5__ times from armpit along side of trunk to outer hip, making your other pathway. Do __1_ time per day.  Copyright  VHI. All rights reserved.  Arm Posterior: Elbow to Shoulder - Sweep   Pump _5__ times from back of elbow to top of shoulder. Then inner to outer upper arm _5_ times, then outer arm again _5_ times. Then back to the pathways _2-3_ times. Do _1__ time per day.  Copyright  VHI. All rights reserved.  ARM: Volar Wrist to Elbow - Sweep   Pump or stationary circles _5__ times from wrist to elbow making sure to do both sides of the forearm. Then retrace your steps to the outer arm, and the pathways _2-3_ times each. Do _1__ time per day.  Copyright  VHI. All rights reserved.  ARM: Dorsum of Hand to Shoulder - Sweep   Pump or stationary circles _5__ times on back of hand including knuckle spaces and individual fingers if needed working up towards the wrist, then retrace all your steps working back up the forearm, doing both sides; upper outer arm and back to your pathways _2-3_ times each. Then do 5 circles again at uninvolved armpit and involved groin where you started! Good job!! Do __1_ time per day.  Copyright  VHI. All rights reserved.

## 2021-09-01 ENCOUNTER — Other Ambulatory Visit (HOSPITAL_COMMUNITY): Payer: Self-pay

## 2021-09-01 ENCOUNTER — Other Ambulatory Visit: Payer: Self-pay | Admitting: Hematology and Oncology

## 2021-09-01 DIAGNOSIS — C50411 Malignant neoplasm of upper-outer quadrant of right female breast: Secondary | ICD-10-CM

## 2021-09-01 MED ORDER — PALBOCICLIB 75 MG PO TABS
75.0000 mg | ORAL_TABLET | Freq: Every day | ORAL | 3 refills | Status: DC
  Filled 2021-09-01: qty 21, 28d supply, fill #0
  Filled 2021-10-08: qty 21, 28d supply, fill #1
  Filled 2021-10-28: qty 21, 28d supply, fill #2
  Filled 2021-11-26: qty 21, 28d supply, fill #3

## 2021-09-02 ENCOUNTER — Ambulatory Visit: Payer: Medicare HMO | Admitting: Physical Therapy

## 2021-09-04 ENCOUNTER — Encounter: Payer: Medicare HMO | Admitting: Rehabilitation

## 2021-09-07 ENCOUNTER — Ambulatory Visit: Payer: Medicare HMO | Admitting: Physical Therapy

## 2021-09-07 ENCOUNTER — Encounter: Payer: Self-pay | Admitting: Physical Therapy

## 2021-09-07 DIAGNOSIS — M62838 Other muscle spasm: Secondary | ICD-10-CM

## 2021-09-07 DIAGNOSIS — C50411 Malignant neoplasm of upper-outer quadrant of right female breast: Secondary | ICD-10-CM

## 2021-09-07 DIAGNOSIS — I890 Lymphedema, not elsewhere classified: Secondary | ICD-10-CM | POA: Diagnosis not present

## 2021-09-07 DIAGNOSIS — M25511 Pain in right shoulder: Secondary | ICD-10-CM

## 2021-09-07 DIAGNOSIS — M25611 Stiffness of right shoulder, not elsewhere classified: Secondary | ICD-10-CM

## 2021-09-07 NOTE — Therapy (Signed)
OUTPATIENT PHYSICAL THERAPY ONCOLOGY TREATMENT  Patient Name: Audrey [name] MRN: [id number] DOB:1940/01/21, 81 y.o., female Today's Date: 09/07/2021   PT End of Session - 09/07/21 1056     Visit Number 9    Number of Visits 17    Date for PT Re-Evaluation 10/05/21    PT Start Time 68    PT Stop Time 1151    PT Time Calculation (min) 56 min    Activity Tolerance Patient tolerated treatment well    Behavior During Therapy [facility] for tasks assessed/performed               Past Medical History:  Diagnosis Date   Anxiety    Arthritis    "hands" (02/25/2014)   Borderline type 2 diabetes mellitus    Breast cancer, right breast ([place]) 1999   recurrence 08/2017   COVID    March 2021 and early 2022   GERD (gastroesophageal reflux disease)    High cholesterol    "can't take the RX; makes my bones ache" (02/25/2014)   History of hiatal hernia    History of stomach ulcers    Hypertension    Migraines    hx   Pneumonia    PONV (postoperative nausea and vomiting)    at last colonoscopy she was told she was hard to awaken   Shortness of breath dyspnea    w/ exertion    Past Surgical History:  Procedure Laterality Date   APPENDECTOMY     BREAST BIOPSY Right 1999   BREAST LUMPECTOMY Right 1999   BRONCHIAL BIOPSY  12/29/2020   Procedure: BRONCHIAL BIOPSIES;  Surgeon: Collene Gobble, MD;  Location: [place];  Service: Pulmonary;;   BRONCHIAL BRUSHINGS  12/29/2020   Procedure: BRONCHIAL BRUSHINGS;  Surgeon: Collene Gobble, MD;  Location: [facility]OSCOPY;  Service: Pulmonary;;   BRONCHIAL NEEDLE ASPIRATION BIOPSY  12/29/2020   Procedure: BRONCHIAL NEEDLE ASPIRATION BIOPSIES;  Surgeon: Collene Gobble, MD;  Location: [facility] ENDOSCOPY;  Service: Pulmonary;;   BRONCHIAL WASHINGS  12/29/2020   Procedure: BRONCHIAL WASHINGS;  Surgeon: Collene Gobble, MD;  Location: [facility] ENDOSCOPY;  Service: Pulmonary;;   DILATION AND CURETTAGE OF UTERUS     FINE NEEDLE ASPIRATION  12/29/2020    Procedure: FINE NEEDLE ASPIRATION (FNA) LINEAR;  Surgeon: Collene Gobble, MD;  Location: [facility] ENDOSCOPY;  Service: Pulmonary;;   PARTIAL KNEE ARTHROPLASTY Left 02/25/2014   Procedure: LEFT UNICOMPARTMENTAL (medial compartment) KNEE;  Surgeon: Vickey Huger, MD;  Location: [place];  Service: Orthopedics;  Laterality: Left;   REPLACEMENT UNICONDYLAR JOINT KNEE Left 02/25/2014   TONSILLECTOMY     VAGINAL HYSTERECTOMY     VIDEO BRONCHOSCOPY WITH ENDOBRONCHIAL ULTRASOUND N/A 12/29/2020   Procedure: VIDEO BRONCHOSCOPY WITH ENDOBRONCHIAL ULTRASOUND;  Surgeon: Collene Gobble, MD;  Location: [place] ENDOSCOPY;  Service: Pulmonary;  Laterality: N/A;   VIDEO BRONCHOSCOPY WITH RADIAL ENDOBRONCHIAL ULTRASOUND  12/29/2020   Procedure: RADIAL ENDOBRONCHIAL ULTRASOUND;  Surgeon: Collene Gobble, MD;  Location: [facility] ENDOSCOPY;  Service: Pulmonary;;   Patient Active Problem List   Diagnosis Date Noted   Abnormal CT of the chest 12/18/2020   Malignant neoplasm of upper-outer quadrant of right breast in female, estrogen receptor positive ([place]) 08/14/2019   S/P left unicompartmental knee replacement 02/25/2014    PCP: [place] PROVIDER: Nicholas Lose  REFERRING DIAG: C50.411,Z17.0 (ICD-10-CM) - Malignant neoplasm of upper-outer quadrant of right breast in female, estrogen receptor positive ([employer])  THERAPY DIAG:  Stiffness of right shoulder, not  elsewhere classified  Acute pain of right shoulder  Other muscle spasm  Lymphedema, not elsewhere classified  Malignant neoplasm of upper-outer quadrant of right female breast, unspecified estrogen receptor status ([place])  ONSET DATE: 03/11/21  Rationale for Evaluation and Treatment Rehabilitation  SUBJECTIVE                                                                                                                                                                                           SUBJECTIVE STATEMENT: I think there is something wrong with my R  shoulder and when it acts up the whole arm acts up.   PERTINENT HISTORY:  H/O right breast cancer on 2017 and 1999: Lumpectomy.  (Bilateral mastectomies in 2017: could not tolerate antiestrogen therapy) Chest CT on 06/06/19 showed a mass in the right axilla and a mass in the right lower lung lobe. PET scan on 07/03/19 showed a right lower lobe pulmonary nodule concerning for malignancy, the right axillary mass consistent with local breast cancer recurrence, and small hypermetabolic right axillary and neck nodes consistent with metastatic adenopathy. Biopsy of the right axilla on 07/13/19 showed carcinoma consistent with breast carcinoma, HER-2 negative (1+), ER+ 95%, PR+ 75%, Ki67 25%. Currently taking Ibrance. Negative doppler.   PAIN:  Are you having pain? No   PRECAUTIONS: Other: lymphedema RUE Hx of L TKA  WEIGHT BEARING RESTRICTIONS No  FALLS:  Has patient fallen in last 6 months? No  LIVING ENVIRONMENT: Lives with: lives with their daughter Lives in: House/apartment Stairs: No;  Has following equipment at home: Environmental consultant - 2 wheeled  OCCUPATION: retired  LEISURE: pt reports she does not exercise  HAND DOMINANCE : right   PRIOR LEVEL OF FUNCTION: Independent  PATIENT GOALS to make the swelling go away and make it feel better   OBJECTIVE  COGNITION:  Overall cognitive status: Within functional limits for tasks assessed   PALPATION: Thick edema palpable at olecranon through axilla   POSTURE: forward head and rounded shoulders  UPPER EXTREMITY AROM/PROM:  A/PROM RIGHT   eval  09/07/21  Shoulder extension 56 58  Shoulder flexion 132 123 (148 after STM)  Shoulder abduction 120 115 (148 after STM)  Shoulder internal rotation 36 48  Shoulder external rotation 78 71    (Blank rows = not tested)  A/PROM LEFT   eval  Shoulder extension 60  Shoulder flexion 141  Shoulder abduction 161  Shoulder internal rotation 46  Shoulder external rotation 88    (Blank rows =  not tested)   LYMPHEDEMA ASSESSMENTS:   SURGERY TYPE/DATE: R lumpectomy in 1999, Bilateral mastectomies in 2017 [place]  REMOVED: 2 or 3 removed in 1999 CHEMOTHERAPY: did not do RADIATION:completed in 1999/2000 HORMONE TREATMENT: Ibrance INFECTIONS: none  LYMPHEDEMA ASSESSMENTS:   [facility] RIGHT  eval 08/17/21 08/19/21 08/24/21 08/26/21 08/31/21 09/07/21  10 cm proximal to olecranon process 32.7 32 31.4 31.5 30.4 30.7 31  Olecranon process 28 28 26 26  25.6 26 25.1  10 cm proximal to ulnar styloid process 22.8 23 22.8 21.8 21.8 21.6 22.1  Just proximal to ulnar styloid process 15 15 14.6 15 15 15 14   Across hand at thumb web space 18 18 17.5 17.8 17.6 18.1 18  At base of 2nd digit 5.5 5.6 5.4 5.4 5.1 5.5 5.4  (Blank rows = not tested)  LANDMARK LEFT  eval  10 cm proximal to olecranon process 28  Olecranon process 25  10 cm proximal to ulnar styloid process 20  Just proximal to ulnar styloid process 13.7  Across hand at thumb web space 17.8  At base of 2nd digit 5.5  (Blank rows = not tested)    TODAY'S TREATMENT   09/07/21: STM in L s/L to R serratus anterior, lats, upper traps and rhomboids to help decrease pain and muscle spasms using cocoa butter - pts ROM improved greatly following STM Scapular mobilization in L S/L in all directions with increased tightness noted in all directions that improved with therapy today PROM in supine to pt's tolerance in to flexion and abduction  08/31/21: Assessed pt's garments for fit. Pt reports the fingers of her glove are tight so therapist remeasured and her glove is the appropriate size. Educated pt to put markers in her glove fingers when she is not wearing it to help stretch them out some. Educated pt on the availability of the FlexiTouch compression pump for long term management of edema. Issued info on this and pt requests demographics be sent to [employer] to check for insurance coverage. Instruct pt in self MLD today and had pt  return demonstrate entire sequence as follows: In sitting: Short neck, 5 diaphragmatic breaths, R inguinal nodes and establishment of Rt axillo-inguinal pathway, then Rt UE working proximal to distal, moving inner upper arm outwards and upwards, and doing both sides of forearms, spending extra time in any areas of fibrosis then retracing all steps. Pt required mod/max v/c and t/c for proper skin stretch, pressure and speed. Issued handout for pt to practice at home.   08/28/21: Pt received her daytime and night time garment. Educated pt in proper donning and doffing for her garments and had pt return demonstrate to ensure she was able to independently don/doff. All garments fit well and had appropriate compression. Educated pt to wear daytime garment during waking hours and then don night time garment and to wear both daily to avoid an increase in edema. Also issued info on obtain donning gloves to assist with donning garments.   08/26/21: Manual Therapy In supine: Short neck, 5 diaphragmatic breaths, R inguinal nodes and establishment of Rt axillo-inguinal pathway, then Rt UE working proximal to distal, moving inner upper arm outwards and upwards, and doing both sides of forearms, spending extra time in any areas of fibrosis then retracing all steps  Compression bandaging: TG soft, elastomull to digits 1-5, artiflex from hand to axilla, 1 6cm bandage at hand, 1 8 cm bandage for "X" at elbow, and 1 10 cm bandage from wrist to axilla   08/24/21: Removed bandages and had pt wash her arm with soap and water. Remeasured circumferences and all circumferences are within  2 cm of the left side except for the upper arm but that is only 3 cm away. Proceed with educating pt about different types of commercially available compression garments including flat knit vs circular knit sleeves and night time garments. Measured pt for a flat knit [employer] sleeve and glove (size 1) and a [employer] Profile size 2 night  time garment. Alight is covering her glove and night time garment which was ordered today and pt covered her compression sleeve.  Compression bandaging: TG soft, elastomull to digits 1-5, artiflex from hand to axilla, 1 6cm bandage at hand, 1 8 cm bandage for "X" at elbow, and 1 10 cm bandage from wrist to axilla    PATIENT EDUCATION:  Education details: anatomy and physiology of the lymphatic system, how lymphedema is managed, compression bandaging Person educated: Patient and Child(ren) Education method: Explanation Education comprehension: verbalized understanding   HOME EXERCISE PROGRAM: Daughter will attempt bandaging:  Has link to " Lymphedema Management: Caregiver Bandaging you arm"  on youtube  ASSESSMENT CLINICAL IMPRESSION: Remeasured circumferences today and pt has decreased at wrist and olecranon. She has been wearing her compression garments but has not been pulling them up all the way so educated pt to wear it high on her upper arm. Pt reports increased right shoudler pain and muscle spasms so added goals to address this. Began soft tissue mobilization today to help decrease pain and improve ROM. At end of soft tissue mobilization and scapular mobilization, pt's right shoulder ROM improved greatly. Pt would benefit from skilled PT services to improve R shoulder ROM and decrease muscle spasms.   OBJECTIVE IMPAIRMENTS decreased ROM, increased edema, increased fascial restrictions, postural dysfunction, and pain.   ACTIVITY LIMITATIONS carrying and lifting  PARTICIPATION LIMITATIONS:  none  PERSONAL FACTORS Time since onset of injury/illness/exacerbation are also affecting patient's functional outcome.   REHAB POTENTIAL: Good  CLINICAL DECISION MAKING: Stable/uncomplicated  EVALUATION COMPLEXITY: Low  GOALS: Goals reviewed with patient? Yes  SHORT TERM GOALS = LONG TERM GOALS Target date: 09/02/2021    Pt will be independent in self MLD for long term management of  lymphedema.  Baseline: no knowledge Goal status: IN PROGRESS  2.  Pt will demonstrate a 4 cm decrease in lymphedema at 10 cm proximal to olecranon to decrease risk of edema.  Baseline: 32.7 cm; 09/07/21- 31 Goal status: IN PROGRESS  3.  Pt will obtain appropriate compression garments for long term management of lymphedema.  Baseline: none Goal status: MET 09/07/21 pt has day and night time garments  4.  Pt will improve R shoulder abduction to 150 degrees to allow her to reach out to the side.  Baseline: 120 degrees; 09/07/21- 115 Goal status: IN PROGRESS  5.  Pt will report she is able to sleep through the night without being awoken by pain in R arm.  Baseline: wakes up due to pain in arm Goal status: IN PROGRESS  6. Pt will be able to turn her body to look over her shoulder without muscle spasms.  Goal Status: INITIAL PLAN: PT FREQUENCY: 2x/week  PT DURATION: 4 weeks   PLANNED INTERVENTIONS: Therapeutic exercises, Therapeutic activity, Patient/Family education, Self Care, Joint mobilization, Orthotic/Fit training, Manual lymph drainage, Compression bandaging, scar mobilization, Vasopneumatic device, and Manual therapy  PLAN FOR NEXT SESSION: flexi coverage, STM to lats and serratus, AAROM exercises, eventually supine scap   [employer], PT 09/07/2021, 12:09 PM   Deep Effective Breath   Standing, sitting, or laying down,  place both hands on the belly. Take a deep breath IN, expanding the belly; then breath OUT, contracting the belly. Repeat __5__ times. Do __2-3__ sessions per day and before your self massage.  http://[email or web address].exer.us/866   Copyright  VHI. All rights reserved.  Axilla to Axilla - Sweep   On uninvolved side make 5 circles in the armpit, then pump _5__ times from involved armpit across chest to uninvolved armpit, making a pathway. Do _1__ time per day.  Copyright  VHI. All rights reserved.  Axilla to Inguinal Nodes - Sweep   On involved side,  make 5 circles at groin at panty line, then pump _5__ times from armpit along side of trunk to outer hip, making your other pathway. Do __1_ time per day.  Copyright  VHI. All rights reserved.  Arm Posterior: Elbow to Shoulder - Sweep   Pump _5__ times from back of elbow to top of shoulder. Then inner to outer upper arm _5_ times, then outer arm again _5_ times. Then back to the pathways _2-3_ times. Do _1__ time per day.  Copyright  VHI. All rights reserved.  ARM: Volar Wrist to Elbow - Sweep   Pump or stationary circles _5__ times from wrist to elbow making sure to do both sides of the forearm. Then retrace your steps to the outer arm, and the pathways _2-3_ times each. Do _1__ time per day.  Copyright  VHI. All rights reserved.  ARM: Dorsum of Hand to Shoulder - Sweep   Pump or stationary circles _5__ times on back of hand including knuckle spaces and individual fingers if needed working up towards the wrist, then retrace all your steps working back up the forearm, doing both sides; upper outer arm and back to your pathways _2-3_ times each. Then do 5 circles again at uninvolved armpit and involved groin where you started! Good job!! Do __1_ time per day.  Copyright  VHI. All rights reserved.

## 2021-09-09 ENCOUNTER — Encounter: Payer: Medicare HMO | Admitting: Physical Therapy

## 2021-09-10 ENCOUNTER — Other Ambulatory Visit (HOSPITAL_COMMUNITY): Payer: Self-pay

## 2021-09-10 ENCOUNTER — Ambulatory Visit: Payer: Medicare HMO | Admitting: Physical Therapy

## 2021-09-10 ENCOUNTER — Encounter: Payer: Self-pay | Admitting: Physical Therapy

## 2021-09-10 DIAGNOSIS — C50411 Malignant neoplasm of upper-outer quadrant of right female breast: Secondary | ICD-10-CM

## 2021-09-10 DIAGNOSIS — I890 Lymphedema, not elsewhere classified: Secondary | ICD-10-CM | POA: Diagnosis not present

## 2021-09-10 DIAGNOSIS — M25511 Pain in right shoulder: Secondary | ICD-10-CM

## 2021-09-10 DIAGNOSIS — M25611 Stiffness of right shoulder, not elsewhere classified: Secondary | ICD-10-CM

## 2021-09-10 DIAGNOSIS — M62838 Other muscle spasm: Secondary | ICD-10-CM

## 2021-09-10 NOTE — Therapy (Signed)
OUTPATIENT PHYSICAL THERAPY ONCOLOGY TREATMENT  Patient Name: Audrey [name] MRN: [id number] DOB:1940-01-15, 81 y.o., female Today's Date: 09/10/2021   PT End of Session - 09/10/21 1304     Visit Number 10    Number of Visits 17    Date for PT Re-Evaluation 10/05/21    PT Start Time 7262    PT Stop Time 0355    PT Time Calculation (min) 50 min    Activity Tolerance Patient tolerated treatment well    Behavior During Therapy [facility] for tasks assessed/performed               Past Medical History:  Diagnosis Date   Anxiety    Arthritis    "hands" (02/25/2014)   Borderline type 2 diabetes mellitus    Breast cancer, right breast ([place]) 1999   recurrence 08/2017   COVID    March 2021 and early 2022   GERD (gastroesophageal reflux disease)    High cholesterol    "can't take the RX; makes my bones ache" (02/25/2014)   History of hiatal hernia    History of stomach ulcers    Hypertension    Migraines    hx   Pneumonia    PONV (postoperative nausea and vomiting)    at last colonoscopy she was told she was hard to awaken   Shortness of breath dyspnea    w/ exertion    Past Surgical History:  Procedure Laterality Date   APPENDECTOMY     BREAST BIOPSY Right 1999   BREAST LUMPECTOMY Right 1999   BRONCHIAL BIOPSY  12/29/2020   Procedure: BRONCHIAL BIOPSIES;  Surgeon: Collene Gobble, MD;  Location: [place];  Service: Pulmonary;;   BRONCHIAL BRUSHINGS  12/29/2020   Procedure: BRONCHIAL BRUSHINGS;  Surgeon: Collene Gobble, MD;  Location: [facility] ENDOSCOPY;  Service: Pulmonary;;   BRONCHIAL NEEDLE ASPIRATION BIOPSY  12/29/2020   Procedure: BRONCHIAL NEEDLE ASPIRATION BIOPSIES;  Surgeon: Collene Gobble, MD;  Location: [facility] ENDOSCOPY;  Service: Pulmonary;;   BRONCHIAL WASHINGS  12/29/2020   Procedure: BRONCHIAL WASHINGS;  Surgeon: Collene Gobble, MD;  Location: [facility] ENDOSCOPY;  Service: Pulmonary;;   DILATION AND CURETTAGE OF UTERUS     FINE NEEDLE ASPIRATION  12/29/2020    Procedure: FINE NEEDLE ASPIRATION (FNA) LINEAR;  Surgeon: Collene Gobble, MD;  Location: [facility] ENDOSCOPY;  Service: Pulmonary;;   PARTIAL KNEE ARTHROPLASTY Left 02/25/2014   Procedure: LEFT UNICOMPARTMENTAL (medial compartment) KNEE;  Surgeon: Vickey Huger, MD;  Location: [place];  Service: Orthopedics;  Laterality: Left;   REPLACEMENT UNICONDYLAR JOINT KNEE Left 02/25/2014   TONSILLECTOMY     VAGINAL HYSTERECTOMY     VIDEO BRONCHOSCOPY WITH ENDOBRONCHIAL ULTRASOUND N/A 12/29/2020   Procedure: VIDEO BRONCHOSCOPY WITH ENDOBRONCHIAL ULTRASOUND;  Surgeon: Collene Gobble, MD;  Location: [place] ENDOSCOPY;  Service: Pulmonary;  Laterality: N/A;   VIDEO BRONCHOSCOPY WITH RADIAL ENDOBRONCHIAL ULTRASOUND  12/29/2020   Procedure: RADIAL ENDOBRONCHIAL ULTRASOUND;  Surgeon: Collene Gobble, MD;  Location: [facility] ENDOSCOPY;  Service: Pulmonary;;   Patient Active Problem List   Diagnosis Date Noted   Abnormal CT of the chest 12/18/2020   Malignant neoplasm of upper-outer quadrant of right breast in female, estrogen receptor positive ([place]) 08/14/2019   S/P left unicompartmental knee replacement 02/25/2014    PCP: [place] PROVIDER: Nicholas Lose  REFERRING DIAG: C50.411,Z17.0 (ICD-10-CM) - Malignant neoplasm of upper-outer quadrant of right breast in female, estrogen receptor positive ([place])  THERAPY DIAG:  Stiffness of right shoulder, not  elsewhere classified  Acute pain of right shoulder  Other muscle spasm  Lymphedema, not elsewhere classified  Malignant neoplasm of upper-outer quadrant of right female breast, unspecified estrogen receptor status ([place])  ONSET DATE: 03/11/21  Rationale for Evaluation and Treatment Rehabilitation  SUBJECTIVE                                                                                                                                                                                           SUBJECTIVE STATEMENT: My shoulder is doing better. I haven't had  any of those sharp pains when I turn   PERTINENT HISTORY:  H/O right breast cancer on 2017 and 1999: Lumpectomy.  (Bilateral mastectomies in 2017: could not tolerate antiestrogen therapy) Chest CT on 06/06/19 showed a mass in the right axilla and a mass in the right lower lung lobe. PET scan on 07/03/19 showed a right lower lobe pulmonary nodule concerning for malignancy, the right axillary mass consistent with local breast cancer recurrence, and small hypermetabolic right axillary and neck nodes consistent with metastatic adenopathy. Biopsy of the right axilla on 07/13/19 showed carcinoma consistent with breast carcinoma, HER-2 negative (1+), ER+ 95%, PR+ 75%, Ki67 25%. Currently taking Ibrance. Negative doppler.   PAIN:  Are you having pain? No   PRECAUTIONS: Other: lymphedema RUE Hx of L TKA  WEIGHT BEARING RESTRICTIONS No  FALLS:  Has patient fallen in last 6 months? No  LIVING ENVIRONMENT: Lives with: lives with their daughter Lives in: House/apartment Stairs: No;  Has following equipment at home: Environmental consultant - 2 wheeled  OCCUPATION: retired  LEISURE: pt reports she does not exercise  HAND DOMINANCE : right   PRIOR LEVEL OF FUNCTION: Independent  PATIENT GOALS to make the swelling go away and make it feel better   OBJECTIVE  COGNITION:  Overall cognitive status: Within functional limits for tasks assessed   PALPATION: Thick edema palpable at olecranon through axilla   POSTURE: forward head and rounded shoulders  UPPER EXTREMITY AROM/PROM:  A/PROM RIGHT   eval  09/07/21 09/10/21  Shoulder extension 56 58   Shoulder flexion 132 123 (148 after STM) 125  Shoulder abduction 120 115 (148 after STM) 130  Shoulder internal rotation 36 48   Shoulder external rotation 78 71     (Blank rows = not tested)  A/PROM LEFT   eval  Shoulder extension 60  Shoulder flexion 141  Shoulder abduction 161  Shoulder internal rotation 46  Shoulder external rotation 88    (Blank rows  = not tested)   LYMPHEDEMA ASSESSMENTS:   SURGERY TYPE/DATE: R lumpectomy in 1999, Bilateral mastectomies in 2017 NUMBER OF  LYMPH NODES REMOVED: 2 or 3 removed in 1999 CHEMOTHERAPY: did not do RADIATION:completed in 1999/2000 HORMONE TREATMENT: Ibrance INFECTIONS: none  LYMPHEDEMA ASSESSMENTS:   [facility] RIGHT  eval 08/17/21 08/19/21 08/24/21 08/26/21 08/31/21 09/07/21  10 cm proximal to olecranon process 32.7 32 31.4 31.5 30.4 30.7 31  Olecranon process 28 28 26 26  25.6 26 25.1  10 cm proximal to ulnar styloid process 22.8 23 22.8 21.8 21.8 21.6 22.1  Just proximal to ulnar styloid process 15 15 14.6 15 15 15 14   Across hand at thumb web space 18 18 17.5 17.8 17.6 18.1 18  At base of 2nd digit 5.5 5.6 5.4 5.4 5.1 5.5 5.4  (Blank rows = not tested)  LANDMARK LEFT  eval  10 cm proximal to olecranon process 28  Olecranon process 25  10 cm proximal to ulnar styloid process 20  Just proximal to ulnar styloid process 13.7  Across hand at thumb web space 17.8  At base of 2nd digit 5.5  (Blank rows = not tested)    TODAY'S TREATMENT   09/10/21: STM in L s/L to R serratus anterior, lats, upper traps and rhomboids to help decrease pain and muscle spasms using cocoa butter - muscular tightness improved greatly from last session PROM to R shoulder in to flexion, abduction and ER with no impingement type pain only stretching across chest and axilla  09/07/21: STM in L s/L to R serratus anterior, lats, upper traps and rhomboids to help decrease pain and muscle spasms using cocoa butter - pts ROM improved greatly following STM Scapular mobilization in L S/L in all directions with increased tightness noted in all directions that improved with therapy today PROM in supine to pt's tolerance in to flexion and abduction  08/31/21: Assessed pt's garments for fit. Pt reports the fingers of her glove are tight so therapist remeasured and her glove is the appropriate size. Educated pt to put markers in  her glove fingers when she is not wearing it to help stretch them out some. Educated pt on the availability of the FlexiTouch compression pump for long term management of edema. Issued info on this and pt requests demographics be sent to [employer] to check for insurance coverage. Instruct pt in self MLD today and had pt return demonstrate entire sequence as follows: In sitting: Short neck, 5 diaphragmatic breaths, R inguinal nodes and establishment of Rt axillo-inguinal pathway, then Rt UE working proximal to distal, moving inner upper arm outwards and upwards, and doing both sides of forearms, spending extra time in any areas of fibrosis then retracing all steps. Pt required mod/max v/c and t/c for proper skin stretch, pressure and speed. Issued handout for pt to practice at home.   08/28/21: Pt received her daytime and night time garment. Educated pt in proper donning and doffing for her garments and had pt return demonstrate to ensure she was able to independently don/doff. All garments fit well and had appropriate compression. Educated pt to wear daytime garment during waking hours and then don night time garment and to wear both daily to avoid an increase in edema. Also issued info on obtain donning gloves to assist with donning garments.   08/26/21: Manual Therapy In supine: Short neck, 5 diaphragmatic breaths, R inguinal nodes and establishment of Rt axillo-inguinal pathway, then Rt UE working proximal to distal, moving inner upper arm outwards and upwards, and doing both sides of forearms, spending extra time in any areas of fibrosis then retracing all steps  Compression  bandaging: TG soft, elastomull to digits 1-5, artiflex from hand to axilla, 1 6cm bandage at hand, 1 8 cm bandage for "X" at elbow, and 1 10 cm bandage from wrist to axilla   08/24/21: Removed bandages and had pt wash her arm with soap and water. Remeasured circumferences and all circumferences are within 2 cm of the left side except  for the upper arm but that is only 3 cm away. Proceed with educating pt about different types of commercially available compression garments including flat knit vs circular knit sleeves and night time garments. Measured pt for a flat knit [employer] sleeve and glove (size 1) and a [employer] Profile size 2 night time garment. Alight is covering her glove and night time garment which was ordered today and pt covered her compression sleeve.  Compression bandaging: TG soft, elastomull to digits 1-5, artiflex from hand to axilla, 1 6cm bandage at hand, 1 8 cm bandage for "X" at elbow, and 1 10 cm bandage from wrist to axilla    PATIENT EDUCATION:  Education details: anatomy and physiology of the lymphatic system, how lymphedema is managed, compression bandaging Person educated: Patient and Child(ren) Education method: Explanation Education comprehension: verbalized understanding   HOME EXERCISE PROGRAM: Daughter will attempt bandaging:  Has link to " Lymphedema Management: Caregiver Bandaging you arm"  on youtube  ASSESSMENT CLINICAL IMPRESSION: Pt maintained some gains in ROM after last session. She had much less tightness in scapular muscles today compared to last session. She has been having less shoulder pain and has not had any muscle spasms since last session. Next session will begin AAROM exercises and  possibly supine scapular series.   OBJECTIVE IMPAIRMENTS decreased ROM, increased edema, increased fascial restrictions, postural dysfunction, and pain.   ACTIVITY LIMITATIONS carrying and lifting  PARTICIPATION LIMITATIONS:  none  PERSONAL FACTORS Time since onset of injury/illness/exacerbation are also affecting patient's functional outcome.   REHAB POTENTIAL: Good  CLINICAL DECISION MAKING: Stable/uncomplicated  EVALUATION COMPLEXITY: Low  GOALS: Goals reviewed with patient? Yes  SHORT TERM GOALS = LONG TERM GOALS Target date: 09/02/2021    Pt will be independent in  self MLD for long term management of lymphedema.  Baseline: no knowledge Goal status: IN PROGRESS  2.  Pt will demonstrate a 4 cm decrease in lymphedema at 10 cm proximal to olecranon to decrease risk of edema.  Baseline: 32.7 cm; 09/07/21- 31 Goal status: IN PROGRESS  3.  Pt will obtain appropriate compression garments for long term management of lymphedema.  Baseline: none Goal status: MET 09/07/21 pt has day and night time garments  4.  Pt will improve R shoulder abduction to 150 degrees to allow her to reach out to the side.  Baseline: 120 degrees; 09/07/21- 115 Goal status: IN PROGRESS  5.  Pt will report she is able to sleep through the night without being awoken by pain in R arm.  Baseline: wakes up due to pain in arm Goal status: IN PROGRESS  6. Pt will be able to turn her body to look over her shoulder without muscle spasms.  Goal Status: INITIAL PLAN: PT FREQUENCY: 2x/week  PT DURATION: 4 weeks   PLANNED INTERVENTIONS: Therapeutic exercises, Therapeutic activity, Patient/Family education, Self Care, Joint mobilization, Orthotic/Fit training, Manual lymph drainage, Compression bandaging, scar mobilization, Vasopneumatic device, and Manual therapy  PLAN FOR NEXT SESSION: STM to lats and serratus, AAROM exercises, eventually supine scap   [employer], PT 09/10/2021, 1:59 PM   Deep Effective  Breath   Standing, sitting, or laying down, place both hands on the belly. Take a deep breath IN, expanding the belly; then breath OUT, contracting the belly. Repeat __5__ times. Do __2-3__ sessions per day and before your self massage.  [email or web address]://[email or web address].exer.us/866   Copyright  VHI. All rights reserved.  Axilla to Axilla - Sweep   On uninvolved side make 5 circles in the armpit, then pump _5__ times from involved armpit across chest to uninvolved armpit, making a pathway. Do _1__ time per day.  Copyright  VHI. All rights reserved.  Axilla to Inguinal Nodes -  Sweep   On involved side, make 5 circles at groin at panty line, then pump _5__ times from armpit along side of trunk to outer hip, making your other pathway. Do __1_ time per day.  Copyright  VHI. All rights reserved.  Arm Posterior: Elbow to Shoulder - Sweep   Pump _5__ times from back of elbow to top of shoulder. Then inner to outer upper arm _5_ times, then outer arm again _5_ times. Then back to the pathways _2-3_ times. Do _1__ time per day.  Copyright  VHI. All rights reserved.  ARM: Volar Wrist to Elbow - Sweep   Pump or stationary circles _5__ times from wrist to elbow making sure to do both sides of the forearm. Then retrace your steps to the outer arm, and the pathways _2-3_ times each. Do _1__ time per day.  Copyright  VHI. All rights reserved.  ARM: Dorsum of Hand to Shoulder - Sweep   Pump or stationary circles _5__ times on back of hand including knuckle spaces and individual fingers if needed working up towards the wrist, then retrace all your steps working back up the forearm, doing both sides; upper outer arm and back to your pathways _2-3_ times each. Then do 5 circles again at uninvolved armpit and involved groin where you started! Good job!! Do __1_ time per day.  Copyright  VHI. All rights reserved.

## 2021-09-11 ENCOUNTER — Encounter: Payer: Medicare HMO | Admitting: Rehabilitation

## 2021-09-15 ENCOUNTER — Encounter: Payer: Medicare HMO | Admitting: Rehabilitation

## 2021-09-16 ENCOUNTER — Other Ambulatory Visit (HOSPITAL_COMMUNITY): Payer: Self-pay

## 2021-09-17 ENCOUNTER — Ambulatory Visit: Payer: Medicare HMO | Attending: Hematology and Oncology

## 2021-09-17 DIAGNOSIS — C50411 Malignant neoplasm of upper-outer quadrant of right female breast: Secondary | ICD-10-CM | POA: Insufficient documentation

## 2021-09-17 DIAGNOSIS — M25611 Stiffness of right shoulder, not elsewhere classified: Secondary | ICD-10-CM | POA: Diagnosis present

## 2021-09-17 DIAGNOSIS — M25511 Pain in right shoulder: Secondary | ICD-10-CM | POA: Insufficient documentation

## 2021-09-17 DIAGNOSIS — I890 Lymphedema, not elsewhere classified: Secondary | ICD-10-CM | POA: Diagnosis present

## 2021-09-17 DIAGNOSIS — M62838 Other muscle spasm: Secondary | ICD-10-CM | POA: Diagnosis present

## 2021-09-17 NOTE — Therapy (Signed)
OUTPATIENT PHYSICAL THERAPY ONCOLOGY TREATMENT  Patient Name: Audrey [name] MRN: [id number] DOB:[birth year], 81 y.o., female Today's Date: 09/17/2021   PT End of Session - 09/17/21 1114     Visit Number 11    Number of Visits 17    Date for PT Re-Evaluation 10/05/21    PT Start Time 1106    PT Stop Time 1200    PT Time Calculation (min) 54 min    Activity Tolerance Patient tolerated treatment well    Behavior During Therapy [facility] for tasks assessed/performed               Past Medical History:  Diagnosis Date   Anxiety    Arthritis    "hands" (02/25/2014)   Borderline type 2 diabetes mellitus    Breast cancer, right breast ([place]) 1999   recurrence 08/2017   COVID    March 2021 and early 2022   GERD (gastroesophageal reflux disease)    High cholesterol    "can't take the RX; makes my bones ache" (02/25/2014)   History of hiatal hernia    History of stomach ulcers    Hypertension    Migraines    hx   Pneumonia    PONV (postoperative nausea and vomiting)    at last colonoscopy she was told she was hard to awaken   Shortness of breath dyspnea    w/ exertion    Past Surgical History:  Procedure Laterality Date   APPENDECTOMY     BREAST BIOPSY Right 1999   BREAST LUMPECTOMY Right 1999   BRONCHIAL BIOPSY  12/29/2020   Procedure: BRONCHIAL BIOPSIES;  Surgeon: Collene Gobble, MD;  Location: [facility] ENDOSCOPY;  Service: Pulmonary;;   BRONCHIAL BRUSHINGS  12/29/2020   Procedure: BRONCHIAL BRUSHINGS;  Surgeon: Collene Gobble, MD;  Location: [facility] ENDOSCOPY;  Service: Pulmonary;;   BRONCHIAL NEEDLE ASPIRATION BIOPSY  12/29/2020   Procedure: BRONCHIAL NEEDLE ASPIRATION BIOPSIES;  Surgeon: Collene Gobble, MD;  Location: [facility] ENDOSCOPY;  Service: Pulmonary;;   BRONCHIAL WASHINGS  12/29/2020   Procedure: BRONCHIAL WASHINGS;  Surgeon: Collene Gobble, MD;  Location: [facility] ENDOSCOPY;  Service: Pulmonary;;   DILATION AND CURETTAGE OF UTERUS     FINE NEEDLE ASPIRATION  12/29/2020    Procedure: FINE NEEDLE ASPIRATION (FNA) LINEAR;  Surgeon: Collene Gobble, MD;  Location: [facility] ENDOSCOPY;  Service: Pulmonary;;   PARTIAL KNEE ARTHROPLASTY Left 02/25/2014   Procedure: LEFT UNICOMPARTMENTAL (medial compartment) KNEE;  Surgeon: Vickey Huger, MD;  Location: [place];  Service: Orthopedics;  Laterality: Left;   REPLACEMENT UNICONDYLAR JOINT KNEE Left 02/25/2014   TONSILLECTOMY     VAGINAL HYSTERECTOMY     VIDEO BRONCHOSCOPY WITH ENDOBRONCHIAL ULTRASOUND N/A 12/29/2020   Procedure: VIDEO BRONCHOSCOPY WITH ENDOBRONCHIAL ULTRASOUND;  Surgeon: Collene Gobble, MD;  Location: [place] ENDOSCOPY;  Service: Pulmonary;  Laterality: N/A;   VIDEO BRONCHOSCOPY WITH RADIAL ENDOBRONCHIAL ULTRASOUND  12/29/2020   Procedure: RADIAL ENDOBRONCHIAL ULTRASOUND;  Surgeon: Collene Gobble, MD;  Location: [facility] ENDOSCOPY;  Service: Pulmonary;;   Patient Active Problem List   Diagnosis Date Noted   Abnormal CT of the chest 12/18/2020   Malignant neoplasm of upper-outer quadrant of right breast in female, estrogen receptor positive ([place]) 08/14/2019   S/P left unicompartmental knee replacement 02/25/2014    PCP: [facility] PROVIDER: Nicholas Lose  REFERRING DIAG: C50.411,Z17.0 (ICD-10-CM) - Malignant neoplasm of upper-outer quadrant of right breast in female, estrogen receptor positive ([place])  THERAPY DIAG:  Stiffness of right shoulder, not  elsewhere classified  Acute pain of right shoulder  Other muscle spasm  Lymphedema, not elsewhere classified  Malignant neoplasm of upper-outer quadrant of right female breast, unspecified estrogen receptor status ([place])  ONSET DATE: 03/11/21  Rationale for Evaluation and Treatment Rehabilitation  SUBJECTIVE                                                                                                                                                                                           SUBJECTIVE STATEMENT: I had a little pain waking up this morning  at my Rt axilla/trunk but it feels better right now.   PERTINENT HISTORY:  H/O right breast cancer on 2017 and 1999: Lumpectomy.  (Bilateral mastectomies in 2017: could not tolerate antiestrogen therapy) Chest CT on 06/06/19 showed a mass in the right axilla and a mass in the right lower lung lobe. PET scan on 07/03/19 showed a right lower lobe pulmonary nodule concerning for malignancy, the right axillary mass consistent with local breast cancer recurrence, and small hypermetabolic right axillary and neck nodes consistent with metastatic adenopathy. Biopsy of the right axilla on 07/13/19 showed carcinoma consistent with breast carcinoma, HER-2 negative (1+), ER+ 95%, PR+ 75%, Ki67 25%. Currently taking Ibrance. Negative doppler.   PAIN:  Are you having pain? No   PRECAUTIONS: Other: lymphedema RUE Hx of L TKA  WEIGHT BEARING RESTRICTIONS No  FALLS:  Has patient fallen in last 6 months? No  LIVING ENVIRONMENT: Lives with: lives with their daughter Lives in: House/apartment Stairs: No;  Has following equipment at home: Environmental consultant - 2 wheeled  OCCUPATION: retired  LEISURE: pt reports she does not exercise  HAND DOMINANCE : right   PRIOR LEVEL OF FUNCTION: Independent  PATIENT GOALS to make the swelling go away and make it feel better   OBJECTIVE  COGNITION:  Overall cognitive status: Within functional limits for tasks assessed   PALPATION: Thick edema palpable at olecranon through axilla   POSTURE: forward head and rounded shoulders  UPPER EXTREMITY AROM/PROM:  A/PROM RIGHT   eval  09/07/21 09/10/21  Shoulder extension 56 58   Shoulder flexion 132 123 (148 after STM) 125  Shoulder abduction 120 115 (148 after STM) 130  Shoulder internal rotation 36 48   Shoulder external rotation 78 71     (Blank rows = not tested)  A/PROM LEFT   eval  Shoulder extension 60  Shoulder flexion 141  Shoulder abduction 161  Shoulder internal rotation 46  Shoulder external rotation 88     (Blank rows = not tested)   LYMPHEDEMA ASSESSMENTS:   SURGERY TYPE/DATE: R lumpectomy in 1999, Bilateral mastectomies in  2017 NUMBER OF LYMPH NODES REMOVED: 2 or 3 removed in 1999 CHEMOTHERAPY: did not do RADIATION:completed in 1999/2000 HORMONE TREATMENT: Ibrance INFECTIONS: none  LYMPHEDEMA ASSESSMENTS:   [facility] RIGHT  eval 08/17/21 08/19/21 08/24/21 08/26/21 08/31/21 09/07/21  10 cm proximal to olecranon process 32.7 32 31.4 31.5 30.4 30.7 31  Olecranon process 28 28 26 26  25.6 26 25.1  10 cm proximal to ulnar styloid process 22.8 23 22.8 21.8 21.8 21.6 22.1  Just proximal to ulnar styloid process 15 15 14.6 15 15 15 14   Across hand at thumb web space 18 18 17.5 17.8 17.6 18.1 18  At base of 2nd digit 5.5 5.6 5.4 5.4 5.1 5.5 5.4  (Blank rows = not tested)  LANDMARK LEFT  eval  10 cm proximal to olecranon process 28  Olecranon process 25  10 cm proximal to ulnar styloid process 20  Just proximal to ulnar styloid process 13.7  Across hand at thumb web space 17.8  At base of 2nd digit 5.5  (Blank rows = not tested)    TODAY'S TREATMENT  09/17/21: Therapeutic Exs Pulleys into flexion and abduction x2 mins each returning therapist demo for each and tactile cues during to decrease Rt scapular compensation which pt struggled with Ball roll up wall into flexion x5 returning therapist demo Supine Scapular Series with yellow theraband x5 each returning therapist demo for each and VCs to decrease scapular compensation Manual Therapy STM in L S/L to R serratus anterior, lats, upper traps and rhomboids to help decrease pain and muscle spasms using cocoa butter - muscular tightness improved greatly from last session PROM to R shoulder in to flexion, abduction and ER with no impingement type pain only stretching across chest and axilla  09/10/21: STM in L s/L to R serratus anterior, lats, upper traps and rhomboids to help decrease pain and muscle spasms using cocoa butter - muscular  tightness improved greatly from last session PROM to R shoulder in to flexion, abduction and ER with no impingement type pain only stretching across chest and axilla  09/07/21: STM in L s/L to R serratus anterior, lats, upper traps and rhomboids to help decrease pain and muscle spasms using cocoa butter - pts ROM improved greatly following STM Scapular mobilization in L S/L in all directions with increased tightness noted in all directions that improved with therapy today PROM in supine to pt's tolerance in to flexion and abduction    PATIENT EDUCATION:  Education details: Supine Scapular Series  Person educated: Patient Education method: Explanation Education comprehension: verbalized understanding, returned demo and will benefit from further review   HOME EXERCISE PROGRAM: Daughter will attempt bandaging:  Has link to " Lymphedema Management: Caregiver Bandaging you arm"  on youtube Supine Scapular series with yellow theraband  ASSESSMENT CLINICAL IMPRESSION: Pt did well with beginning new AA/ROM exs and adding supine scapular series to HEP. Advised her to only do this every other day for now, slowly increasing reps from 5 to 7 then eventually 10. She verbalized good understanding. Also continued manual therapy working to improve her Rt shoulder end P/ROM. She conts to tolerate this very well without pinching pain.   OBJECTIVE IMPAIRMENTS decreased ROM, increased edema, increased fascial restrictions, postural dysfunction, and pain.   ACTIVITY LIMITATIONS carrying and lifting  PARTICIPATION LIMITATIONS:  none  PERSONAL FACTORS Time since onset of injury/illness/exacerbation are also affecting patient's functional outcome.   REHAB POTENTIAL: Good  CLINICAL DECISION MAKING: Stable/uncomplicated  EVALUATION COMPLEXITY: Low  GOALS: Goals reviewed  with patient? Yes  SHORT TERM GOALS = LONG TERM GOALS Target date: 09/02/2021    Pt will be independent in self MLD for long term  management of lymphedema.  Baseline: no knowledge Goal status: IN PROGRESS  2.  Pt will demonstrate a 4 cm decrease in lymphedema at 10 cm proximal to olecranon to decrease risk of edema.  Baseline: 32.7 cm; 09/07/21- 31 Goal status: IN PROGRESS  3.  Pt will obtain appropriate compression garments for long term management of lymphedema.  Baseline: none Goal status: MET 09/07/21 pt has day and night time garments  4.  Pt will improve R shoulder abduction to 150 degrees to allow her to reach out to the side.  Baseline: 120 degrees; 09/07/21- 115 Goal status: IN PROGRESS  5.  Pt will report she is able to sleep through the night without being awoken by pain in R arm.  Baseline: wakes up due to pain in arm Goal status: IN PROGRESS  6. Pt will be able to turn her body to look over her shoulder without muscle spasms.  Goal Status: INITIAL PLAN: PT FREQUENCY: 2x/week  PT DURATION: 4 weeks   PLANNED INTERVENTIONS: Therapeutic exercises, Therapeutic activity, Patient/Family education, Self Care, Joint mobilization, Orthotic/Fit training, Manual lymph drainage, Compression bandaging, scar mobilization, Vasopneumatic device, and Manual therapy  PLAN FOR NEXT SESSION: STM to lats and serratus, AAROM exercises, review supine scap and assess tolerance of this   Otelia Limes, PTA 09/17/2021, 12:05 PM   Deep Effective Breath   Standing, sitting, or laying down, place both hands on the belly. Take a deep breath IN, expanding the belly; then breath OUT, contracting the belly. Repeat __5__ times. Do __2-3__ sessions per day and before your self massage.  http://[email or web address].exer.us/866   Copyright  VHI. All rights reserved.  Axilla to Axilla - Sweep   On uninvolved side make 5 circles in the armpit, then pump _5__ times from involved armpit across chest to uninvolved armpit, making a pathway. Do _1__ time per day.  Copyright  VHI. All rights reserved.  Axilla to Inguinal Nodes -  Sweep   On involved side, make 5 circles at groin at panty line, then pump _5__ times from armpit along side of trunk to outer hip, making your other pathway. Do __1_ time per day.  Copyright  VHI. All rights reserved.  Arm Posterior: Elbow to Shoulder - Sweep   Pump _5__ times from back of elbow to top of shoulder. Then inner to outer upper arm _5_ times, then outer arm again _5_ times. Then back to the pathways _2-3_ times. Do _1__ time per day.  Copyright  VHI. All rights reserved.  ARM: [place]olar Wrist to Elbow - Sweep   Pump or stationary circles _5__ times from wrist to elbow making sure to do both sides of the forearm. Then retrace your steps to the outer arm, and the pathways _2-3_ times each. Do _1__ time per day.  Copyright  VHI. All rights reserved.  ARM: Dorsum of Hand to Shoulder - Sweep   Pump or stationary circles _5__ times on back of hand including knuckle spaces and individual fingers if needed working up towards the wrist, then retrace all your steps working back up the forearm, doing both sides; upper outer arm and back to your pathways _2-3_ times each. Then do 5 circles again at uninvolved armpit and involved groin where you started! Good job!! Do __1_ time per day.  Copyright  VHI. All rights reserved.  Over Head Pull: Narrow and Wide Grip   Cancer Rehab [phone]   On back, knees bent, feet flat, band across thighs, elbows straight but relaxed. Pull hands apart (start). Keeping elbows straight, bring arms up and over head, hands toward floor. Keep pull steady on band. Hold momentarily. Return slowly, keeping pull steady, back to start. Then do same with a wider grip on the band (past shoulder width) Repeat _5-10__ times. Band color __yellow____   Side Pull: Double Arm   On back, knees bent, feet flat. Arms perpendicular to body, shoulder level, elbows straight but relaxed. Pull arms out to sides, elbows straight. Resistance band comes across  collarbones, hands toward floor. Hold momentarily. Slowly return to starting position. Repeat _5-10__ times. Band color _yellow____   Sword   On back, knees bent, feet flat, left hand on left hip, right hand above left. Pull right arm DIAGONALLY (hip to shoulder) across chest. Bring right arm along head toward floor. Hold momentarily. Slowly return to starting position. Repeat _5-10__ times. Do with left arm. Band color _yellow_____   Shoulder Rotation: Double Arm   On back, knees bent, feet flat, elbows tucked at sides, bent 90, hands palms up. Pull hands apart and down toward floor, keeping elbows near sides. Hold momentarily. Slowly return to starting position. Repeat _5-10__ times. Band color __yellow____

## 2021-09-17 NOTE — Patient Instructions (Addendum)
Over Head Pull: Narrow and Wide Grip   Cancer Rehab [phone]   On back, knees bent, feet flat, band across thighs, elbows straight but relaxed. Pull hands apart (start). Keeping elbows straight, bring arms up and over head, hands toward floor. Keep pull steady on band. Hold momentarily. Return slowly, keeping pull steady, back to start. Then do same with a wider grip on the band (past shoulder width) Repeat _5-10__ times. Band color __yellow____   Side Pull: Double Arm   On back, knees bent, feet flat. Arms perpendicular to body, shoulder level, elbows straight but relaxed. Pull arms out to sides, elbows straight. Resistance band comes across collarbones, hands toward floor. Hold momentarily. Slowly return to starting position. Repeat _5-10__ times. Band color _yellow____   Sword   On back, knees bent, feet flat, left hand on left hip, right hand above left. Pull right arm DIAGONALLY (hip to shoulder) across chest. Bring right arm along head toward floor. Hold momentarily. Slowly return to starting position. Repeat _5-10__ times. Do with left arm. Band color _yellow_____   Shoulder Rotation: Double Arm   On back, knees bent, feet flat, elbows tucked at sides, bent 90, hands palms up. Pull hands apart and down toward floor, keeping elbows near sides. Hold momentarily. Slowly return to starting position. Repeat _5-10__ times. Band color __yellow____

## 2021-09-21 ENCOUNTER — Ambulatory Visit: Payer: Medicare HMO | Admitting: Physical Therapy

## 2021-09-22 ENCOUNTER — Ambulatory Visit: Payer: Medicare HMO

## 2021-09-22 DIAGNOSIS — I890 Lymphedema, not elsewhere classified: Secondary | ICD-10-CM

## 2021-09-22 DIAGNOSIS — M25611 Stiffness of right shoulder, not elsewhere classified: Secondary | ICD-10-CM | POA: Diagnosis not present

## 2021-09-22 DIAGNOSIS — M25511 Pain in right shoulder: Secondary | ICD-10-CM

## 2021-09-22 DIAGNOSIS — M62838 Other muscle spasm: Secondary | ICD-10-CM

## 2021-09-22 DIAGNOSIS — C50411 Malignant neoplasm of upper-outer quadrant of right female breast: Secondary | ICD-10-CM

## 2021-09-22 NOTE — Therapy (Signed)
OUTPATIENT PHYSICAL THERAPY ONCOLOGY TREATMENT  Patient Name: Audrey [name] MRN: [id number] DOB:1940-04-25, 81 y.o., female Today's Date: 09/22/2021   PT End of Session - 09/22/21 1204     Visit Number 12    Number of Visits 17    Date for PT Re-Evaluation 10/05/21    PT Start Time 1205    PT Stop Time [id number]    PT Time Calculation (min) 50 min    Activity Tolerance Patient tolerated treatment well    Behavior During Therapy [facility] for tasks assessed/performed               Past Medical History:  Diagnosis Date   Anxiety    Arthritis    "hands" (02/25/2014)   Borderline type 2 diabetes mellitus    Breast cancer, right breast ([place]) 1999   recurrence 08/2017   COVID    March 2021 and early 2022   GERD (gastroesophageal reflux disease)    High cholesterol    "can't take the RX; makes my bones ache" (02/25/2014)   History of hiatal hernia    History of stomach ulcers    Hypertension    Migraines    hx   Pneumonia    PONV (postoperative nausea and vomiting)    at last colonoscopy she was told she was hard to awaken   Shortness of breath dyspnea    w/ exertion    Past Surgical History:  Procedure Laterality Date   APPENDECTOMY     BREAST BIOPSY Right 1999   BREAST LUMPECTOMY Right 1999   BRONCHIAL BIOPSY  12/29/2020   Procedure: BRONCHIAL BIOPSIES;  Surgeon: Collene Gobble, MD;  Location: [facility] ENDOSCOPY;  Service: Pulmonary;;   BRONCHIAL BRUSHINGS  12/29/2020   Procedure: BRONCHIAL BRUSHINGS;  Surgeon: Collene Gobble, MD;  Location: [facility] ENDOSCOPY;  Service: Pulmonary;;   BRONCHIAL NEEDLE ASPIRATION BIOPSY  12/29/2020   Procedure: BRONCHIAL NEEDLE ASPIRATION BIOPSIES;  Surgeon: Collene Gobble, MD;  Location: [facility] ENDOSCOPY;  Service: Pulmonary;;   BRONCHIAL WASHINGS  12/29/2020   Procedure: BRONCHIAL WASHINGS;  Surgeon: Collene Gobble, MD;  Location: [facility] ENDOSCOPY;  Service: Pulmonary;;   DILATION AND CURETTAGE OF UTERUS     FINE NEEDLE ASPIRATION  12/29/2020    Procedure: FINE NEEDLE ASPIRATION (FNA) LINEAR;  Surgeon: Collene Gobble, MD;  Location: [facility] ENDOSCOPY;  Service: Pulmonary;;   PARTIAL KNEE ARTHROPLASTY Left 02/25/2014   Procedure: LEFT UNICOMPARTMENTAL (medial compartment) KNEE;  Surgeon: Vickey Huger, MD;  Location: [place];  Service: Orthopedics;  Laterality: Left;   REPLACEMENT UNICONDYLAR JOINT KNEE Left 02/25/2014   TONSILLECTOMY     VAGINAL HYSTERECTOMY     VIDEO BRONCHOSCOPY WITH ENDOBRONCHIAL ULTRASOUND N/A 12/29/2020   Procedure: VIDEO BRONCHOSCOPY WITH ENDOBRONCHIAL ULTRASOUND;  Surgeon: Collene Gobble, MD;  Location: [place] ENDOSCOPY;  Service: Pulmonary;  Laterality: N/A;   VIDEO BRONCHOSCOPY WITH RADIAL ENDOBRONCHIAL ULTRASOUND  12/29/2020   Procedure: RADIAL ENDOBRONCHIAL ULTRASOUND;  Surgeon: Collene Gobble, MD;  Location: [facility] ENDOSCOPY;  Service: Pulmonary;;   Patient Active Problem List   Diagnosis Date Noted   Abnormal CT of the chest 12/18/2020   Malignant neoplasm of upper-outer quadrant of right breast in female, estrogen receptor positive ([place]) 08/14/2019   S/P left unicompartmental knee replacement 02/25/2014    PCP: [facility] PROVIDER: Nicholas Lose  REFERRING DIAG: C50.411,Z17.0 (ICD-10-CM) - Malignant neoplasm of upper-outer quadrant of right breast in female, estrogen receptor positive ([place])  THERAPY DIAG:  Stiffness of right shoulder, not  elsewhere classified  Acute pain of right shoulder  Other muscle spasm  Lymphedema, not elsewhere classified  Malignant neoplasm of upper-outer quadrant of right female breast, unspecified estrogen receptor status ([place])  ONSET DATE: 03/11/21  Rationale for Evaluation and Treatment Rehabilitation  SUBJECTIVE                                                                                                                                                                                           SUBJECTIVE STATEMENT: The exercises with the band made my chest  hurt. The gloves still hurts some. I havent been doing the MLD, but I do wear the sleeve and glove and use the night garment.  PERTINENT HISTORY:  H/O right breast cancer on 2017 and 1999: Lumpectomy.  (Bilateral mastectomies in 2017: could not tolerate antiestrogen therapy) Chest CT on 06/06/19 showed a mass in the right axilla and a mass in the right lower lung lobe. PET scan on 07/03/19 showed a right lower lobe pulmonary nodule concerning for malignancy, the right axillary mass consistent with local breast cancer recurrence, and small hypermetabolic right axillary and neck nodes consistent with metastatic adenopathy. Biopsy of the right axilla on 07/13/19 showed carcinoma consistent with breast carcinoma, HER-2 negative (1+), ER+ 95%, PR+ 75%, Ki67 25%. Currently taking Ibrance. Negative doppler.   PAIN:  Are you having pain? No, discomfort at right shoulder blade.   PRECAUTIONS: Other: lymphedema RUE Hx of L TKA  WEIGHT BEARING RESTRICTIONS No  FALLS:  Has patient fallen in last 6 months? No  LIVING ENVIRONMENT: Lives with: lives with their daughter Lives in: House/apartment Stairs: No;  Has following equipment at home: Environmental consultant - 2 wheeled  OCCUPATION: retired  LEISURE: pt reports she does not exercise  HAND DOMINANCE : right   PRIOR LEVEL OF FUNCTION: Independent  PATIENT GOALS to make the swelling go away and make it feel better   OBJECTIVE  COGNITION:  Overall cognitive status: Within functional limits for tasks assessed   PALPATION: Thick edema palpable at olecranon through axilla   POSTURE: forward head and rounded shoulders  UPPER EXTREMITY AROM/PROM:  A/PROM RIGHT   eval  09/07/21 09/10/21  Shoulder extension 56 58   Shoulder flexion 132 123 (148 after STM) 125  Shoulder abduction 120 115 (148 after STM) 130  Shoulder internal rotation 36 48   Shoulder external rotation 78 71     (Blank rows = not tested)  A/PROM LEFT   eval  Shoulder extension 60   Shoulder flexion 141  Shoulder abduction 161  Shoulder internal rotation 46  Shoulder external rotation 88    (Blank rows =  not tested)   LYMPHEDEMA ASSESSMENTS:   SURGERY TYPE/DATE: R lumpectomy in 1999, Bilateral mastectomies in 2017 NUMBER OF LYMPH NODES REMOVED: 2 or 3 removed in 1999 CHEMOTHERAPY: did not do RADIATION:completed in 1999/2000 HORMONE TREATMENT: Ibrance INFECTIONS: none  LYMPHEDEMA ASSESSMENTS:   [facility] RIGHT  eval 08/17/21 08/19/21 08/24/21 08/26/21 08/31/21 09/07/21  10 cm proximal to olecranon process 32.7 32 31.4 31.5 30.4 30.7 31  Olecranon process _0 25.6 26 25.1  10 cm proximal to ulnar styloid process 22.8 23 22.8 21.8 21.8 21.6 22.1  Just proximal to ulnar styloid process 15 15 14._1 Across hand at thumb web space 18 18 17.5 17.8 17.6 18.1 18  At base of 2nd digit 5.5 5.6 5.4 5.4 5.1 5.5 5.4  (Blank rows = not tested)  LANDMARK LEFT  eval  10 cm proximal to olecranon process 28  Olecranon process 25  10 cm proximal to ulnar styloid process 20  Just proximal to ulnar styloid process 13.7  Across hand at thumb web space 17.8  At base of 2nd digit 5.5  (Blank rows = not tested)    TODAY'S TREATMENT   09/22/2021 Manual Therapy STM in L S/L to R serratus anterior, lats, upper traps and rhomboids to help decrease pain and muscle spasms using cocoa butter - muscular tightness improved greatly from last session PROM to R shoulder in to flexion, abduction and ER with no impingement type pain  AROM bilateral shoulder flexion, scaption, horizontal abd x 5 Reviewed supine scapular series except sword  x 3-5 reps.Pt to leave out sword at home and decreas ROM she performs exs in so she doesn't increase chest soreness Showed pts daughter how to adjust sleeve and discussed laundering so sleeve doesn't continue to slide down  09/17/21: Therapeutic Exs Pulleys into flexion and abduction x2 mins each returning therapist demo for each and  tactile cues during to decrease Rt scapular compensation which pt struggled with Ball roll up wall into flexion x5 returning therapist demo Supine Scapular Series with yellow theraband x5 each returning therapist demo for each and VCs to decrease scapular compensation Manual Therapy STM in L S/L to R serratus anterior, lats, upper traps and rhomboids to help decrease pain and muscle spasms using cocoa butter - muscular tightness improved greatly from last session PROM to R shoulder in to flexion, abduction and ER with no impingement type pain only stretching across chest and axilla  09/10/21: STM in L s/L to R serratus anterior, lats, upper traps and rhomboids to help decrease pain and muscle spasms using cocoa butter - muscular tightness improved greatly from last session PROM to R shoulder in to flexion, abduction and ER with no impingement type pain only stretching across chest and axilla  09/07/21: STM in L s/L to R serratus anterior, lats, upper traps and rhomboids to help decrease pain and muscle spasms using cocoa butter - pts ROM improved greatly following STM Scapular mobilization in L S/L in all directions with increased tightness noted in all directions that improved with therapy today PROM in supine to pt's tolerance in to flexion and abduction    PATIENT EDUCATION:  Education details: Supine Scapular Series  Person educated: Patient Education method: Explanation Education comprehension: verbalized understanding, returned demo and will benefit from further review   HOME EXERCISE PROGRAM: Daughter will attempt bandaging:  Has link to " Lymphedema Management: Caregiver Bandaging you arm"  on youtube Supine Scapular series with yellow theraband  ASSESSMENT CLINICAL IMPRESSION: Pt had increased chest pain after doing scapular series at home so reviewed today and left out sword exs and decreased the ROM she was performing in. She did very well with exs in clinic. Performed manual  work to [employer], lats, rhomboids with increased tissue tension noted. Pt had 1 episode of back spasm with AROM exs possibly due to lifting her back with AROM. Resolved quickly and did better with larger bolster. Pt will decrease reps of theraband to 3x and build up from there.   OBJECTIVE IMPAIRMENTS decreased ROM, increased edema, increased fascial restrictions, postural dysfunction, and pain.   ACTIVITY LIMITATIONS carrying and lifting  PARTICIPATION LIMITATIONS:  none  PERSONAL FACTORS Time since onset of injury/illness/exacerbation are also affecting patient's functional outcome.   REHAB POTENTIAL: Good  CLINICAL DECISION MAKING: Stable/uncomplicated  EVALUATION COMPLEXITY: Low  GOALS: Goals reviewed with patient? Yes  SHORT TERM GOALS = LONG TERM GOALS Target date: 09/02/2021    Pt will be independent in self MLD for long term management of lymphedema.  Baseline: no knowledge Goal status: IN PROGRESS  2.  Pt will demonstrate a 4 cm decrease in lymphedema at 10 cm proximal to olecranon to decrease risk of edema.  Baseline: 32.7 cm; 09/07/21- 31 Goal status: IN PROGRESS  3.  Pt will obtain appropriate compression garments for long term management of lymphedema.  Baseline: none Goal status: MET 09/07/21 pt has day and night time garments  4.  Pt will improve R shoulder abduction to 150 degrees to allow her to reach out to the side.  Baseline: 120 degrees; 09/07/21- 115 Goal status: IN PROGRESS  5.  Pt will report she is able to sleep through the night without being awoken by pain in R arm.  Baseline: wakes up due to pain in arm Goal status: IN PROGRESS  6. Pt will be able to turn her body to look over her shoulder without muscle spasms.  Goal Status: INITIAL PLAN: PT FREQUENCY: 2x/week  PT DURATION: 4 weeks   PLANNED INTERVENTIONS: Therapeutic exercises, Therapeutic activity, Patient/Family education, Self Care, Joint mobilization, Orthotic/Fit training, Manual  lymph drainage, Compression bandaging, scar mobilization, Vasopneumatic device, and Manual therapy  PLAN FOR NEXT SESSION: STM to lats and serratus, AAROM exercises, review supine scap and assess tolerance of this, MLD?   Claris Pong, PT 09/22/2021, 1:03 PM   Deep Effective Breath   Standing, sitting, or laying down, place both hands on the belly. Take a deep breath IN, expanding the belly; then breath OUT, contracting the belly. Repeat __5__ times. Do __2-3__ sessions per day and before your self massage.  http://gt2.exer.us/866   Copyright  VHI. All rights reserved.  Axilla to Axilla - Sweep   On uninvolved side make 5 circles in the armpit, then pump _5__ times from involved armpit across chest to uninvolved armpit, making a pathway. Do _1__ time per day.  Copyright  VHI. All rights reserved.  Axilla to Inguinal Nodes - Sweep   On involved side, make 5 circles at groin at panty line, then pump _5__ times from armpit along side of trunk to outer hip, making your other pathway. Do __1_ time per day.  Copyright  VHI. All rights reserved.  Arm Posterior: Elbow to Shoulder - Sweep   Pump _5__ times from back of elbow to top of shoulder. Then inner to outer upper arm _5_ times, then outer arm again _5_ times. Then back to the pathways _2-3_ times. Do _1__ time per day.  Copyright  VHI. All rights reserved.  ARM: [place] Wrist to Elbow - Sweep   Pump or stationary circles _5__ times from wrist to elbow making sure to do both sides of the forearm. Then retrace your steps to the outer arm, and the pathways _2-3_ times each. Do _1__ time per day.  Copyright  VHI. All rights reserved.  ARM: Dorsum of Hand to Shoulder - Sweep   Pump or stationary circles _5__ times on back of hand including knuckle spaces and individual fingers if needed working up towards the wrist, then retrace all your steps working back up the forearm, doing both sides; upper outer arm and back to  your pathways _2-3_ times each. Then do 5 circles again at uninvolved armpit and involved groin where you started! Good job!! Do __1_ time per day.  Copyright  VHI. All rights reserved.    Over Head Pull: Narrow and Wide Grip   Cancer Rehab [phone]   On back, knees bent, feet flat, band across thighs, elbows straight but relaxed. Pull hands apart (start). Keeping elbows straight, bring arms up and over head, hands toward floor. Keep pull steady on band. Hold momentarily. Return slowly, keeping pull steady, back to start. Then do same with a wider grip on the band (past shoulder width) Repeat _5-10__ times. Band color __yellow____   Side Pull: Double Arm   On back, knees bent, feet flat. Arms perpendicular to body, shoulder level, elbows straight but relaxed. Pull arms out to sides, elbows straight. Resistance band comes across collarbones, hands toward floor. Hold momentarily. Slowly return to starting position. Repeat _5-10__ times. Band color _yellow____   Sword   On back, knees bent, feet flat, left hand on left hip, right hand above left. Pull right arm DIAGONALLY (hip to shoulder) across chest. Bring right arm along head toward floor. Hold momentarily. Slowly return to starting position. Repeat _5-10__ times. Do with left arm. Band color _yellow_____   Shoulder Rotation: Double Arm   On back, knees bent, feet flat, elbows tucked at sides, bent 90, hands palms up. Pull hands apart and down toward floor, keeping elbows near sides. Hold momentarily. Slowly return to starting position. Repeat _5-10__ times. Band color __yellow____

## 2021-09-23 ENCOUNTER — Encounter: Payer: Medicare HMO | Admitting: Physical Therapy

## 2021-09-24 ENCOUNTER — Ambulatory Visit: Payer: Medicare HMO | Admitting: Physical Therapy

## 2021-09-24 ENCOUNTER — Ambulatory Visit: Payer: Medicare HMO

## 2021-09-24 DIAGNOSIS — C50411 Malignant neoplasm of upper-outer quadrant of right female breast: Secondary | ICD-10-CM

## 2021-09-24 DIAGNOSIS — M25611 Stiffness of right shoulder, not elsewhere classified: Secondary | ICD-10-CM

## 2021-09-24 DIAGNOSIS — I890 Lymphedema, not elsewhere classified: Secondary | ICD-10-CM

## 2021-09-24 DIAGNOSIS — M25511 Pain in right shoulder: Secondary | ICD-10-CM

## 2021-09-24 DIAGNOSIS — M62838 Other muscle spasm: Secondary | ICD-10-CM

## 2021-09-24 NOTE — Progress Notes (Signed)
Patient Care Team: [name], MD as PCP - General (Family Medicine)  DIAGNOSIS: No diagnosis found.  SUMMARY OF ONCOLOGIC HISTORY: Oncology History  Malignant neoplasm of upper-outer quadrant of right breast in female, estrogen receptor positive ([place])  07/13/2019 Initial Diagnosis   History of right breast cancer on 2017. Chest CT on 06/06/19 showed a mass in the right axilla and a mass in the right lower lung lobe. PET scan on 07/03/19 showed a right lower lobe pulmonary nodule concerning for malignancy, the right axillary mass consistent with local breast cancer recurrence, and small hypermetabolic right axillary and neck nodes consistent with metastatic adenopathy. Biopsy of the right axilla on 07/13/19 showed carcinoma consistent with breast carcinoma, HER-2 negative (1+), ER+ 95%, PR+ 75%, Ki67 25%.   08/14/2019 -  Anti-estrogen oral therapy   Initially anastrozole alone,   Verzenio October 2022 (Verzenio discontinued for diarrhea), switched to [facility] 12/08/2020     CHIEF COMPLIANT:   INTERVAL HISTORY: Audrey [name] is a   ALLERGIES:  is allergic to ativan [lorazepam], cefuroxime, aspirin, codeine, and penicillins.  MEDICATIONS:  Current Outpatient Medications  Medication Sig Dispense Refill   albuterol (VENTOLIN HFA) 108 (90 Base) MCG/ACT inhaler 2 inhalations every 4-6 hours as needed for cough wheezing and shortness of breath     amLODipine (NORVASC) 2.5 MG tablet Take 2.5 mg by mouth daily.     anastrozole (ARIMIDEX) 1 MG tablet TAKE 1 TABLET EVERY DAY 90 tablet 3   cholecalciferol (VITAMIN D3) 25 MCG (1000 UNIT) tablet Take 1,000 Units by mouth daily.     cyclobenzaprine (FLEXERIL) 5 MG tablet Take 5 mg by mouth 3 (three) times daily as needed for muscle spasms.     dicyclomine (BENTYL) 20 MG tablet Take 20 mg by mouth 3 (three) times daily as needed for spasms.     hyoscyamine (LEVBID) 0.375 MG 12 hr tablet Take 0.375 mg by mouth every 12 (twelve) hours as needed for  cramping.     ketotifen (ZADITOR) 0.025 % ophthalmic solution Place 1 drop into both eyes 2 (two) times daily as needed (dry eyes).     omeprazole (PRILOSEC) 20 MG capsule Take 20 mg by mouth daily as needed (acid reflux).     ondansetron (ZOFRAN) 4 MG tablet Take 4 mg by mouth every 8 (eight) hours as needed for nausea or vomiting.     palbociclib (IBRANCE) 75 MG tablet Take 1 tablet (75 mg total) by mouth daily. Take for 21 days on, 7 days off, repeat every 28 days. 21 tablet 3   polyethylene glycol (MIRALAX / GLYCOLAX) 17 g packet Take 17 g by mouth daily as needed for moderate constipation.     No current facility-administered medications for this visit.    PHYSICAL EXAMINATION: ECOG PERFORMANCE STATUS: {CHL ONC ECOG PS:[phone]}  There were no vitals filed for this visit. There were no vitals filed for this visit.  BREAST:*** No palpable masses or nodules in either right or left breasts. No palpable axillary supraclavicular or infraclavicular adenopathy no breast tenderness or nipple discharge. (exam performed in the presence of a chaperone)  LABORATORY DATA:  I have reviewed the data as listed    Latest Ref Rng & Units 07/27/2021   10:47 AM 04/28/2021    9:40 AM 03/23/2021    1:43 PM  CMP  Glucose 70 - 99 mg/dL 107  102  118   BUN 8 - 23 mg/dL 22  18  23    Creatinine 0.44 -  1.00 mg/dL 1.35  1.16  1.28   Sodium 135 - 145 mmol/L 138  139  140   Potassium 3.5 - 5.1 mmol/L 3.9  3.8  3.8   Chloride 98 - 111 mmol/L 106  107  105   CO2 22 - 32 mmol/L 27  25  28    Calcium 8.9 - 10.3 mg/dL 9.7  9.4  9.7   Total Protein 6.5 - 8.1 g/dL 7.1  6.9  6.7   Total Bilirubin 0.3 - 1.2 mg/dL 0.3  0.3  0.4   Alkaline Phos 38 - 126 U/L 72  64  68   AST 15 - 41 U/L 15  11  12    ALT 0 - 44 U/L 12  8  10      Lab Results  Component Value Date   WBC 5.9 07/27/2021   HGB 11.4 (L) 07/27/2021   HCT 33.3 (L) 07/27/2021   MCV 100.3 (H) 07/27/2021   PLT 347 07/27/2021   NEUTROABS 3.3 07/27/2021     ASSESSMENT & PLAN:  No problem-specific Assessment & Plan notes found for this encounter.    No orders of the defined types were placed in this encounter.  The patient has a good understanding of the overall plan. she agrees with it. she will call with any problems that may develop before the next visit here. Total time spent: 30 mins including face to face time and time spent for planning, charting and co-ordination of care   Suzzette Righter, [place] 09/24/21    I Gardiner Coins am scribing for Dr. Lindi Adie  ***

## 2021-09-24 NOTE — Therapy (Signed)
OUTPATIENT PHYSICAL THERAPY ONCOLOGY TREATMENT  Patient Name: Audrey AUGELLO MRN: [id number] DOB:[birth year], 81 y.o., female Today's Date: 09/24/2021   PT End of Session - 09/24/21 1203     Visit Number 13    Number of Visits 17    Date for PT Re-Evaluation 10/05/21    PT Start Time 1205    PT Stop Time 1253    PT Time Calculation (min) 48 min    Activity Tolerance Patient tolerated treatment well    Behavior During Therapy [facility] for tasks assessed/performed               Past Medical History:  Diagnosis Date   Anxiety    Arthritis    "hands" (02/25/2014)   Borderline type 2 diabetes mellitus    Breast cancer, right breast ([place]) 1999   recurrence 08/2017   COVID    March 2021 and early 2022   GERD (gastroesophageal reflux disease)    High cholesterol    "can't take the RX; makes my bones ache" (02/25/2014)   History of hiatal hernia    History of stomach ulcers    Hypertension    Migraines    hx   Pneumonia    PONV (postoperative nausea and vomiting)    at last colonoscopy she was told she was hard to awaken   Shortness of breath dyspnea    w/ exertion    Past Surgical History:  Procedure Laterality Date   APPENDECTOMY     BREAST BIOPSY Right 1999   BREAST LUMPECTOMY Right 1999   BRONCHIAL BIOPSY  12/29/2020   Procedure: BRONCHIAL BIOPSIES;  Surgeon: Collene Gobble, MD;  Location: [facility] ENDOSCOPY;  Service: Pulmonary;;   BRONCHIAL BRUSHINGS  12/29/2020   Procedure: BRONCHIAL BRUSHINGS;  Surgeon: Collene Gobble, MD;  Location: [facility] ENDOSCOPY;  Service: Pulmonary;;   BRONCHIAL NEEDLE ASPIRATION BIOPSY  12/29/2020   Procedure: BRONCHIAL NEEDLE ASPIRATION BIOPSIES;  Surgeon: Collene Gobble, MD;  Location: [facility] ENDOSCOPY;  Service: Pulmonary;;   BRONCHIAL WASHINGS  12/29/2020   Procedure: BRONCHIAL WASHINGS;  Surgeon: Collene Gobble, MD;  Location: [facility] ENDOSCOPY;  Service: Pulmonary;;   DILATION AND CURETTAGE OF UTERUS     FINE NEEDLE ASPIRATION  12/29/2020    Procedure: FINE NEEDLE ASPIRATION (FNA) LINEAR;  Surgeon: Collene Gobble, MD;  Location: [facility] ENDOSCOPY;  Service: Pulmonary;;   PARTIAL KNEE ARTHROPLASTY Left 02/25/2014   Procedure: LEFT UNICOMPARTMENTAL (medial compartment) KNEE;  Surgeon: Vickey Huger, MD;  Location: [place];  Service: Orthopedics;  Laterality: Left;   REPLACEMENT UNICONDYLAR JOINT KNEE Left 02/25/2014   TONSILLECTOMY     VAGINAL HYSTERECTOMY     VIDEO BRONCHOSCOPY WITH ENDOBRONCHIAL ULTRASOUND N/A 12/29/2020   Procedure: VIDEO BRONCHOSCOPY WITH ENDOBRONCHIAL ULTRASOUND;  Surgeon: Collene Gobble, MD;  Location: [place] ENDOSCOPY;  Service: Pulmonary;  Laterality: N/A;   VIDEO BRONCHOSCOPY WITH RADIAL ENDOBRONCHIAL ULTRASOUND  12/29/2020   Procedure: RADIAL ENDOBRONCHIAL ULTRASOUND;  Surgeon: Collene Gobble, MD;  Location[facility] END[facility]COPY;  Service: Pulmonary;;   Patient Active Problem List   Diagnosis Date Noted   Abnormal CT of the chest 12/18/2020   Malignant neoplasm of upper-outer quadrant of right breast in female, estrogen receptor positive ([place]) 08/14/2019   S/P left unicompartmental knee replacement 02/25/2014    PCP: [place] PROVIDER: Nicholas Lose  REFERRING DIAG: C50.411,Z17.0 (ICD-10-CM) - Malignant neoplasm of upper-outer quadrant of right breast in female, estrogen receptor positive ([place])  THERAPY DIAG:  Stiffness of right shoulder, not  elsewhere classified  Acute pain of right shoulder  Other muscle spasm  Lymphedema, not elsewhere classified  Malignant neoplasm of upper-outer quadrant of right female breast, unspecified estrogen receptor status ([place])  ONSET DATE: 03/11/21  Rationale for Evaluation and Treatment Rehabilitation  SUBJECTIVE                                                                                                                                                                                           SUBJECTIVE STATEMENT: My  eye was swollen today when I got up but  I don't know why.  I tried using ice on it but it didn't help.My arm swelling has been good and I have been able to get my sleeve on by myself.I felt better after the manual work last time.  I can lean on my elbow now and shut off my bedside light without pain in my right shoulder blade  PERTINENT HISTORY:  H/O right breast cancer on 2017 and 1999: Lumpectomy.  (Bilateral mastectomies in 2017: could not tolerate antiestrogen therapy) Chest CT on 06/06/19 showed a mass in the right axilla and a mass in the right lower lung lobe. PET scan on 07/03/19 showed a right lower lobe pulmonary nodule concerning for malignancy, the right axillary mass consistent with local breast cancer recurrence, and small hypermetabolic right axillary and neck nodes consistent with metastatic adenopathy. Biopsy of the right axilla on 07/13/19 showed carcinoma consistent with breast carcinoma, HER-2 negative (1+), ER+ 95%, PR+ 75%, Ki67 25%. Currently taking Ibrance. Negative doppler.   PAIN:  Are you having pain? No, discomfort at right shoulder blade.   PRECAUTIONS: Other: lymphedema RUE Hx of L TKA  WEIGHT BEARING RESTRICTIONS No  FALLS:  Has patient fallen in last 6 months? No  LIVING ENVIRONMENT: Lives with: lives with their daughter Lives in: House/apartment Stairs: No;  Has following equipment at home: Environmental consultant - 2 wheeled  OCCUPATION: retired  LEISURE: pt reports she does not exercise  HAND DOMINANCE : right   PRIOR LEVEL OF FUNCTION: Independent  PATIENT GOALS to make the swelling go away and make it feel better   OBJECTIVE  COGNITION:  Overall cognitive status: Within functional limits for tasks assessed   PALPATION: Thick edema palpable at olecranon through axilla   POSTURE: forward head and rounded shoulders  UPPER EXTREMITY AROM/PROM:  A/PROM RIGHT   eval  09/07/21 09/10/21 09/24/2021  Shoulder extension 56 58    Shoulder flexion 132 123 (148 after STM) 125 138  Shoulder abduction 120  115 (148 after STM) 130 140  Shoulder internal rotation 36 48    Shoulder external rotation  78 71      (Blank rows = not tested)  A/PROM LEFT   eval  Shoulder extension 60  Shoulder flexion 141  Shoulder abduction 161  Shoulder internal rotation 46  Shoulder external rotation 88    (Blank rows = not tested)   LYMPHEDEMA ASSESSMENTS:   SURGERY TYPE/DATE: R lumpectomy in 1999, Bilateral mastectomies in 2017 NUMBER OF LYMPH NODES REMOVED: 2 or 3 removed in 1999 CHEMOTHERAPY: did not do RADIATION:completed in 1999/2000 HORMONE TREATMENT: Ibrance INFECTIONS: none  LYMPHEDEMA ASSESSMENTS:   [facility] RIGHT  eval 08/17/21 08/19/21 08/24/21 08/26/21 08/31/21 09/07/21  10 cm proximal to olecranon process 32.7 32 31.4 31.5 30.4 30.7 31  Olecranon process 28 28 26 26  25.6 26 25.1  10 cm proximal to ulnar styloid process 22.8 23 22.8 21.8 21.8 21.6 22.1  Just proximal to ulnar styloid process 15 15 14.6 15 15 15 14   Across hand at thumb web space 18 18 17.5 17.8 17.6 18.1 18  At base of 2nd digit 5.5 5.6 5.4 5.4 5.1 5.5 5.4  (Blank rows = not tested)  LANDMARK LEFT  eval  10 cm proximal to olecranon process 28  Olecranon process 25  10 cm proximal to ulnar styloid process 20  Just proximal to ulnar styloid process 13.7  Across hand at thumb web space 17.8  At base of 2nd digit 5.5  (Blank rows = not tested)    TODAY'S TREATMENT    09/24/2021 STM in L S/L to R serratus anterior, lats, upper traps and rhomboids to help decrease pain and muscle spasms using cocoa butter - muscular tightness improved greatly from last session PROM to R shoulder in to flexion, abduction and ER with no impingement type pain  AROM bilateral shoulder flexion, scaption, horizontal abd x 5 Standing scapular retraction with yellow x 5 Measured shoulder ROM 09/22/2021 Manual Therapy STM in L S/L to R serratus anterior, lats, upper traps and rhomboids to help decrease pain and muscle spasms using cocoa butter  - muscular tightness improved greatly from last session PROM to R shoulder in to flexion, abduction and ER with no impingement type pain  AROM bilateral shoulder flexion, scaption, horizontal abd x 5 Reviewed supine scapular series except sword  x 3-5 reps.Pt to leave out sword at home and decreas ROM she performs exs in so she doesn't increase chest soreness Showed pts daughter how to adjust sleeve and discussed laundering so sleeve doesn't continue to slide down  09/17/21: Therapeutic Exs Pulleys into flexion and abduction x2 mins each returning therapist demo for each and tactile cues during to decrease Rt scapular compensation which pt struggled with Ball roll up wall into flexion x5 returning therapist demo Supine Scapular Series with yellow theraband x5 each returning therapist demo for each and VCs to decrease scapular compensation Manual Therapy STM in L S/L to R serratus anterior, lats, upper traps and rhomboids to help decrease pain and muscle spasms using cocoa butter - muscular tightness improved greatly from last session PROM to R shoulder in to flexion, abduction and ER with no impingement type pain only stretching across chest and axilla  09/10/21: STM in L s/L to R serratus anterior, lats, upper traps and rhomboids to help decrease pain and muscle spasms using cocoa butter - muscular tightness improved greatly from last session PROM to R shoulder in to flexion, abduction and ER with no impingement type pain only stretching across chest and axilla  09/07/21: STM in L s/L to R serratus  anterior, lats, upper traps and rhomboids to help decrease pain and muscle spasms using cocoa butter - pts ROM improved greatly following STM Scapular mobilization in L S/L in all directions with increased tightness noted in all directions that improved with therapy today PROM in supine to pt's tolerance in to flexion and abduction    PATIENT EDUCATION:  Education details: Supine Scapular Series   Person educated: Patient Education method: Explanation Education comprehension: verbalized understanding, returned demo and will benefit from further review   HOME EXERCISE PROGRAM: Daughter will attempt bandaging:  Has link to " Lymphedema Management: Caregiver Bandaging you arm"  on youtube Supine Scapular series with yellow theraband  ASSESSMENT CLINICAL IMPRESSION: Pt had no complaints of chest pain after last visit and was able to do supine band exs at home yesterday without increased pain. She demonstrates fgood increase in shoulder ROM today, and is now able to lean on her right elbow to turn off her bedside light without right elbow pain.  OBJECTIVE IMPAIRMENTS decreased ROM, increased edema, increased fascial restrictions, postural dysfunction, and pain.   ACTIVITY LIMITATIONS carrying and lifting  PARTICIPATION LIMITATIONS:  none  PERSONAL FACTORS Time since onset of injury/illness/exacerbation are also affecting patient's functional outcome.   REHAB POTENTIAL: Good  CLINICAL DECISION MAKING: Stable/uncomplicated  EVALUATION COMPLEXITY: Low  GOALS: Goals reviewed with patient? Yes  SHORT TERM GOALS = LONG TERM GOALS Target date: 09/02/2021    Pt will be independent in self MLD for long term management of lymphedema.  Baseline: no knowledge Goal status: IN PROGRESS  2.  Pt will demonstrate a 4 cm decrease in lymphedema at 10 cm proximal to olecranon to decrease risk of edema.  Baseline: 32.7 cm; 09/07/21- 31 Goal status: IN PROGRESS  3.  Pt will obtain appropriate compression garments for long term management of lymphedema.  Baseline: none Goal status: MET 09/07/21 pt has day and night time garments  4.  Pt will improve R shoulder abduction to 150 degrees to allow her to reach out to the side.  Baseline: 120 degrees; 09/07/21- 115 Goal status: IN PROGRESS  5.  Pt will report she is able to sleep through the night without being awoken by pain in R arm.   Baseline: wakes up due to pain in arm Goal status: IN PROGRESS  6. Pt will be able to turn her body to look over her shoulder without muscle spasms.  Goal Status: INITIAL PLAN: PT FREQUENCY: 2x/week  PT DURATION: 4 weeks   PLANNED INTERVENTIONS: Therapeutic exercises, Therapeutic activity, Patient/Family education, Self Care, Joint mobilization, Orthotic/Fit training, Manual lymph drainage, Compression bandaging, scar mobilization, Vasopneumatic device, and Manual therapy  PLAN FOR NEXT SESSION: STM to lats and serratus, AAROM exercises, review supine scap and assess tolerance of this, MLD?   Claris Pong, PT 09/24/2021, 12:54 PM   Deep Effective Breath   Standing, sitting, or laying down, place both hands on the belly. Take a deep breath IN, expanding the belly; then breath OUT, contracting the belly. Repeat __5__ times. Do __2-3__ sessions per day and before your self massage.  http://gt2.exer.us/866   Copyright  VHI. All rights reserved.  Axilla to Axilla - Sweep   On uninvolved side make 5 circles in the armpit, then pump _5__ times from involved armpit across chest to uninvolved armpit, making a pathway. Do _1__ time per day.  Copyright  VHI. All rights reserved.  Axilla to Inguinal Nodes - Sweep   On involved side, make 5 circles at groin  at panty line, then pump _5__ times from armpit along side of trunk to outer hip, making your other pathway. Do __1_ time per day.  Copyright  VHI. All rights reserved.  Arm Posterior: Elbow to Shoulder - Sweep   Pump _5__ times from back of elbow to top of shoulder. Then inner to outer upper arm _5_ times, then outer arm again _5_ times. Then back to the pathways _2-3_ times. Do _1__ time per day.  Copyright  VHI. All rights reserved.  ARM: Volar Wrist to Elbow - Sweep   Pump or stationary circles _5__ times from wrist to elbow making sure to do both sides of the forearm. Then retrace your steps to the outer arm, and  the pathways _2-3_ times each. Do _1__ time per day.  Copyright  VHI. All rights reserved.  ARM: Dorsum of Hand to Shoulder - Sweep   Pump or stationary circles _5__ times on back of hand including knuckle spaces and individual fingers if needed working up towards the wrist, then retrace all your steps working back up the forearm, doing both sides; upper outer arm and back to your pathways _2-3_ times each. Then do 5 circles again at uninvolved armpit and involved groin where you started! Good job!! Do __1_ time per day.  Copyright  VHI. All rights reserved.    Over Head Pull: Narrow and Wide Grip   Cancer Rehab [phone]   On back, knees bent, feet flat, band across thighs, elbows straight but relaxed. Pull hands apart (start). Keeping elbows straight, bring arms up and over head, hands toward floor. Keep pull steady on band. Hold momentarily. Return slowly, keeping pull steady, back to start. Then do same with a wider grip on the band (past shoulder width) Repeat _5-10__ times. Band color __yellow____   Side Pull: Double Arm   On back, knees bent, feet flat. Arms perpendicular to body, shoulder level, elbows straight but relaxed. Pull arms out to sides, elbows straight. Resistance band comes across collarbones, hands toward floor. Hold momentarily. Slowly return to starting position. Repeat _5-10__ times. Band color _yellow____   Sword   On back, knees bent, feet flat, left hand on left hip, right hand above left. Pull right arm DIAGONALLY (hip to shoulder) across chest. Bring right arm along head toward floor. Hold momentarily. Slowly return to starting position. Repeat _5-10__ times. Do with left arm. Band color _yellow_____   Shoulder Rotation: Double Arm   On back, knees bent, feet flat, elbows tucked at sides, bent 90, hands palms up. Pull hands apart and down toward floor, keeping elbows near sides. Hold momentarily. Slowly return to starting position. Repeat _5-10__  times. Band color __yellow____

## 2021-09-25 ENCOUNTER — Encounter: Payer: Medicare HMO | Admitting: Rehabilitation

## 2021-09-28 ENCOUNTER — Ambulatory Visit: Payer: Medicare HMO | Admitting: Physical Therapy

## 2021-09-29 ENCOUNTER — Inpatient Hospital Stay: Payer: Medicare HMO | Admitting: Hematology and Oncology

## 2021-09-29 ENCOUNTER — Inpatient Hospital Stay: Payer: Medicare HMO | Attending: Hematology and Oncology

## 2021-09-29 ENCOUNTER — Other Ambulatory Visit: Payer: Self-pay

## 2021-09-29 VITALS — BP 160/68 | HR 56 | Temp 97.9°F | Resp 18 | Ht 62.5 in | Wt 139.9 lb

## 2021-09-29 DIAGNOSIS — C50411 Malignant neoplasm of upper-outer quadrant of right female breast: Secondary | ICD-10-CM

## 2021-09-29 DIAGNOSIS — Z880 Allergy status to penicillin: Secondary | ICD-10-CM | POA: Insufficient documentation

## 2021-09-29 DIAGNOSIS — Z886 Allergy status to analgesic agent status: Secondary | ICD-10-CM | POA: Diagnosis not present

## 2021-09-29 DIAGNOSIS — C50919 Malignant neoplasm of unspecified site of unspecified female breast: Secondary | ICD-10-CM

## 2021-09-29 DIAGNOSIS — R5383 Other fatigue: Secondary | ICD-10-CM | POA: Diagnosis not present

## 2021-09-29 DIAGNOSIS — M7989 Other specified soft tissue disorders: Secondary | ICD-10-CM | POA: Diagnosis not present

## 2021-09-29 DIAGNOSIS — Z170 Estrogen receptor positive status [ER+]: Secondary | ICD-10-CM

## 2021-09-29 DIAGNOSIS — Z79899 Other long term (current) drug therapy: Secondary | ICD-10-CM | POA: Insufficient documentation

## 2021-09-29 DIAGNOSIS — R232 Flushing: Secondary | ICD-10-CM | POA: Insufficient documentation

## 2021-09-29 DIAGNOSIS — Z79811 Long term (current) use of aromatase inhibitors: Secondary | ICD-10-CM | POA: Diagnosis not present

## 2021-09-29 DIAGNOSIS — Z885 Allergy status to narcotic agent status: Secondary | ICD-10-CM | POA: Diagnosis not present

## 2021-09-29 DIAGNOSIS — Z881 Allergy status to other antibiotic agents status: Secondary | ICD-10-CM | POA: Diagnosis not present

## 2021-09-29 DIAGNOSIS — I890 Lymphedema, not elsewhere classified: Secondary | ICD-10-CM | POA: Diagnosis not present

## 2021-09-29 LAB — CBC WITH DIFFERENTIAL (CANCER CENTER ONLY)
Abs Immature Granulocytes: 0.01 10*3/uL (ref 0.00–0.07)
Basophils Absolute: 0.1 10*3/uL (ref 0.0–0.1)
Basophils Relative: 1 %
Eosinophils Absolute: 0 10*3/uL (ref 0.0–0.5)
Eosinophils Relative: 1 %
HCT: 32.5 % — ABNORMAL LOW (ref 36.0–46.0)
Hemoglobin: 11.5 g/dL — ABNORMAL LOW (ref 12.0–15.0)
Immature Granulocytes: 0 %
Lymphocytes Relative: 42 %
Lymphs Abs: 2.7 10*3/uL (ref 0.7–4.0)
MCH: 34.7 pg — ABNORMAL HIGH (ref 26.0–34.0)
MCHC: 35.4 g/dL (ref 30.0–36.0)
MCV: 98.2 fL (ref 80.0–100.0)
Monocytes Absolute: 0.3 10*3/uL (ref 0.1–1.0)
Monocytes Relative: 4 %
Neutro Abs: 3.4 10*3/uL (ref 1.7–7.7)
Neutrophils Relative %: 52 %
Platelet Count: 287 10*3/uL (ref 150–400)
RBC: 3.31 MIL/uL — ABNORMAL LOW (ref 3.87–5.11)
RDW: 13.2 % (ref 11.5–15.5)
WBC Count: 6.4 10*3/uL (ref 4.0–10.5)
nRBC: 0 % (ref 0.0–0.2)

## 2021-09-29 LAB — CMP (CANCER CENTER ONLY)
ALT: 11 U/L (ref 0–44)
AST: 14 U/L — ABNORMAL LOW (ref 15–41)
Albumin: 4.5 g/dL (ref 3.5–5.0)
Alkaline Phosphatase: 75 U/L (ref 38–126)
Anion gap: 8 (ref 5–15)
BUN: 18 mg/dL (ref 8–23)
CO2: 26 mmol/L (ref 22–32)
Calcium: 9.7 mg/dL (ref 8.9–10.3)
Chloride: 105 mmol/L (ref 98–111)
Creatinine: 1.2 mg/dL — ABNORMAL HIGH (ref 0.44–1.00)
GFR, Estimated: 46 mL/min — ABNORMAL LOW (ref >60)
Glucose, Bld: 88 mg/dL (ref 70–99)
Potassium: 3.9 mmol/L (ref 3.5–5.1)
Sodium: 139 mmol/L (ref 135–145)
Total Bilirubin: 0.4 mg/dL (ref 0.3–1.2)
Total Protein: 7.2 g/dL (ref 6.5–8.1)

## 2021-09-29 NOTE — Assessment & Plan Note (Signed)
H/Oright breast cancer on 2017and 1999: Lumpectomy.(Bilateral mastectomies in 2017: Seen by Dr. Lavera Guise, could not tolerate antiestrogen therapy) Chest CT on 06/06/19 showed a mass in the right axilla and a mass in the right lower lung lobe. PET scan on 07/03/19 showed a right lower lobe pulmonary nodule concerning for malignancy, the right axillary mass consistent with local breast cancer recurrence, and small hypermetabolic right axillary and neck nodes consistent with metastatic adenopathy. Biopsy of the right axilla on 07/13/19 showed carcinoma consistent with breast carcinoma, HER-2 negative (1+), ER+ 95%, PR+ 75%, Ki67 25%. ---------------------------------------------------------------------------------------------------------------------------------------------- Current treatment: Anastrozole 1 mg daily started 8/3/2021withIbrance(Verzinio was discontinued because of diarrhea) Ibrancetoxicities:Labs have been reviewed 1.Diverticulitis:Intermittent diarrhea 2.no evidence of leukopenia. 3.Fatigue: She would like to take a lower dosage of Ibrance. We will reduce the dosage to 75 mg 3 weeks on 1 week off starting 05/25/2021.  05/22/2021: Mild to moderate improvement in thoracic adenopathy compared to prior.  Previously hypermetabolic right lower lobe mass is minimally smaller, small sclerotic lesion T2 vertebral body stable  Right arm swelling: No evidence of DVT by ultrasound done today.  She is going through lymphedema physical therapy but she does not want to go to the facility and wants to do it on her own.  Return to clinic in 3 months with labs, scans and follow-up.

## 2021-09-30 ENCOUNTER — Telehealth: Payer: Self-pay | Admitting: Hematology and Oncology

## 2021-09-30 LAB — CANCER ANTIGEN 27.29: CA 27.29: 99.7 U/mL — ABNORMAL HIGH (ref 0.0–38.6)

## 2021-09-30 NOTE — Telephone Encounter (Signed)
Scheduled appointment per 9/19 los. Left voicemail.

## 2021-10-01 ENCOUNTER — Encounter: Payer: Self-pay | Admitting: Physical Therapy

## 2021-10-01 ENCOUNTER — Ambulatory Visit: Payer: Medicare HMO | Admitting: Physical Therapy

## 2021-10-01 DIAGNOSIS — I890 Lymphedema, not elsewhere classified: Secondary | ICD-10-CM

## 2021-10-01 DIAGNOSIS — M25611 Stiffness of right shoulder, not elsewhere classified: Secondary | ICD-10-CM | POA: Diagnosis not present

## 2021-10-01 DIAGNOSIS — C50411 Malignant neoplasm of upper-outer quadrant of right female breast: Secondary | ICD-10-CM

## 2021-10-01 DIAGNOSIS — M62838 Other muscle spasm: Secondary | ICD-10-CM

## 2021-10-01 DIAGNOSIS — M25511 Pain in right shoulder: Secondary | ICD-10-CM

## 2021-10-01 NOTE — Therapy (Signed)
OUTPATIENT PHYSICAL THERAPY ONCOLOGY TREATMENT  Patient Name: Audrey [name] MRN: [id number] DOB:23-Sep-1940, 81 y.o., female Today's Date: 10/01/2021   PT End of Session - 10/01/21 1354     Visit Number 14    Number of Visits 17    Date for PT Re-Evaluation 10/05/21    PT Start Time 1352    PT Stop Time 7654    PT Time Calculation (min) 56 min    Activity Tolerance Patient tolerated treatment well    Behavior During Therapy [facility] for tasks assessed/performed                Past Medical History:  Diagnosis Date   Anxiety    Arthritis    "hands" (02/25/2014)   Borderline type 2 diabetes mellitus    Breast cancer, right breast ([place]) 1999   recurrence 08/2017   COVID    March 2021 and early 2022   GERD (gastroesophageal reflux disease)    High cholesterol    "can't take the RX; makes my bones ache" (02/25/2014)   History of hiatal hernia    History of stomach ulcers    Hypertension    Migraines    hx   Pneumonia    PONV (postoperative nausea and vomiting)    at last colonoscopy she was told she was hard to awaken   Shortness of breath dyspnea    w/ exertion    Past Surgical History:  Procedure Laterality Date   APPENDECTOMY     BREAST BIOPSY Right 1999   BREAST LUMPECTOMY Right 1999   BRONCHIAL BIOPSY  12/29/2020   Procedure: BRONCHIAL BIOPSIES;  Surgeon: Collene Gobble, MD;  Location: [facility] ENDOSCOPY;  Service: Pulmonary;;   BRONCHIAL BRUSHINGS  12/29/2020   Procedure: BRONCHIAL BRUSHINGS;  Surgeon: Collene Gobble, MD;  Location: [facility] ENDOSCOPY;  Service: Pulmonary;;   BRONCHIAL NEEDLE ASPIRATION BIOPSY  12/29/2020   Procedure: BRONCHIAL NEEDLE ASPIRATION BIOPSIES;  Surgeon: Collene Gobble, MD;  Location: [facility] ENDOSCOPY;  Service: Pulmonary;;   BRONCHIAL WASHINGS  12/29/2020   Procedure: BRONCHIAL WASHINGS;  Surgeon: Collene Gobble, MD;  Location: [facility] ENDOSCOPY;  Service: Pulmonary;;   DILATION AND CURETTAGE OF UTERUS     FINE NEEDLE ASPIRATION  12/29/2020    Procedure: FINE NEEDLE ASPIRATION (FNA) LINEAR;  Surgeon: Collene Gobble, MD;  Location: [facility] ENDOSCOPY;  Service: Pulmonary;;   PARTIAL KNEE ARTHROPLASTY Left 02/25/2014   Procedure: LEFT UNICOMPARTMENTAL (medial compartment) KNEE;  Surgeon: Vickey Huger, MD;  Location: [place];  Service: Orthopedics;  Laterality: Left;   REPLACEMENT UNICONDYLAR JOINT KNEE Left 02/25/2014   TONSILLECTOMY     VAGINAL HYSTERECTOMY     VIDEO BRONCHOSCOPY WITH ENDOBRONCHIAL ULTRASOUND N/A 12/29/2020   Procedure: VIDEO BRONCHOSCOPY WITH ENDOBRONCHIAL ULTRASOUND;  Surgeon: Collene Gobble, MD;  Location: [place] ENDOSCOPY;  Service: Pulmonary;  Laterality: N/A;   VIDEO BRONCHOSCOPY WITH RADIAL ENDOBRONCHIAL ULTRASOUND  12/29/2020   Procedure: RADIAL ENDOBRONCHIAL ULTRASOUND;  Surgeon: Collene Gobble, MD;  Location: [facility] ENDOSCOPY;  Service: Pulmonary;;   Patient Active Problem List   Diagnosis Date Noted   Abnormal CT of the chest 12/18/2020   Malignant neoplasm of upper-outer quadrant of right breast in female, estrogen receptor positive ([place]) 08/14/2019   S/P left unicompartmental knee replacement 02/25/2014    PCP: [place] PROVIDER: Nicholas Lose  REFERRING DIAG: C50.411,Z17.0 (ICD-10-CM) - Malignant neoplasm of upper-outer quadrant of right breast in female, estrogen receptor positive ([place])  THERAPY DIAG:  Stiffness of right shoulder,  not elsewhere classified  Acute pain of right shoulder  Other muscle spasm  Lymphedema, not elsewhere classified  Malignant neoplasm of upper-outer quadrant of right female breast, unspecified estrogen receptor status ([place])  ONSET DATE: 03/11/21  Rationale for Evaluation and Treatment Rehabilitation  SUBJECTIVE                                                                                                                                                                                           SUBJECTIVE STATEMENT: My shoulder is doing much better. I  cancelled my appointments for next week because I have to have surgery on my eye.   PERTINENT HISTORY:  H/O right breast cancer on 2017 and 1999: Lumpectomy.  (Bilateral mastectomies in 2017: could not tolerate antiestrogen therapy) Chest CT on 06/06/19 showed a mass in the right axilla and a mass in the right lower lung lobe. PET scan on 07/03/19 showed a right lower lobe pulmonary nodule concerning for malignancy, the right axillary mass consistent with local breast cancer recurrence, and small hypermetabolic right axillary and neck nodes consistent with metastatic adenopathy. Biopsy of the right axilla on 07/13/19 showed carcinoma consistent with breast carcinoma, HER-2 negative (1+), ER+ 95%, PR+ 75%, Ki67 25%. Currently taking Ibrance. Negative doppler.   PAIN:  Are you having pain? No, discomfort at right shoulder blade.   PRECAUTIONS: Other: lymphedema RUE Hx of L TKA  WEIGHT BEARING RESTRICTIONS No  FALLS:  Has patient fallen in last 6 months? No  LIVING ENVIRONMENT: Lives with: lives with their daughter Lives in: House/apartment Stairs: No;  Has following equipment at home: Environmental consultant - 2 wheeled  OCCUPATION: retired  LEISURE: pt reports she does not exercise  HAND DOMINANCE : right   PRIOR LEVEL OF FUNCTION: Independent  PATIENT GOALS to make the swelling go away and make it feel better   OBJECTIVE  COGNITION:  Overall cognitive status: Within functional limits for tasks assessed   PALPATION: Thick edema palpable at olecranon through axilla   POSTURE: forward head and rounded shoulders  UPPER EXTREMITY AROM/PROM:  A/PROM RIGHT   eval  09/07/21 09/10/21 09/24/2021 10/01/21  Shoulder extension 56 58     Shoulder flexion 132 123 (148 after STM) 125 138 130; 145 after PROM  Shoulder abduction 120 115 (148 after STM) 130 140 133  Shoulder internal rotation 36 48     Shoulder external rotation 78 71       (Blank rows = not tested)  A/PROM LEFT   eval  Shoulder  extension 60  Shoulder flexion 141  Shoulder abduction 161  Shoulder internal rotation 46  Shoulder external rotation  88    (Blank rows = not tested)   LYMPHEDEMA ASSESSMENTS:   SURGERY TYPE/DATE: R lumpectomy in 1999, Bilateral mastectomies in 2017 NUMBER OF LYMPH NODES REMOVED: 2 or 3 removed in 1999 CHEMOTHERAPY: did not do RADIATION:completed in 1999/2000 HORMONE TREATMENT: Ibrance INFECTIONS: none  LYMPHEDEMA ASSESSMENTS:   [facility] RIGHT  eval 08/17/21 08/19/21 08/24/21 08/26/21 08/31/21 09/07/21  10 cm proximal to olecranon process 32.7 32 31.4 31.5 30.4 30.7 31  Olecranon process 28 28 26 26  25.6 26 25.1  10 cm proximal to ulnar styloid process 22.8 23 22.8 21.8 21.8 21.6 22.1  Just proximal to ulnar styloid process 15 15 14.6 15 15 15 14   Across hand at thumb web space 18 18 17.5 17.8 17.6 18.1 18  At base of 2nd digit 5.5 5.6 5.4 5.4 5.1 5.5 5.4  (Blank rows = not tested)  LANDMARK LEFT  eval  10 cm proximal to olecranon process 28  Olecranon process 25  10 cm proximal to ulnar styloid process 20  Just proximal to ulnar styloid process 13.7  Across hand at thumb web space 17.8  At base of 2nd digit 5.5  (Blank rows = not tested)    TODAY'S TREATMENT    09/24/2021 STM in L S/L to R serratus anterior, lats, upper traps and rhomboids to help decrease pain and muscle spasms using cocoa butter - muscular tightness improved greatly from last session PROM to R shoulder in to flexion, abduction and ER with no impingement type pain  AROM bilateral shoulder flexion, scaption, horizontal abd x 5 Standing scapular retraction with yellow x 5 Measured shoulder ROM 09/22/2021 Manual Therapy STM in L S/L to R serratus anterior, lats, upper traps and rhomboids to help decrease pain and muscle spasms using cocoa butter - muscular tightness improved greatly from last session PROM to R shoulder in to flexion, abduction and ER with no impingement type pain  AROM bilateral shoulder  flexion, scaption, horizontal abd x 5 Reviewed supine scapular series except sword  x 3-5 reps.Pt to leave out sword at home and decreas ROM she performs exs in so she doesn't increase chest soreness Showed pts daughter how to adjust sleeve and discussed laundering so sleeve doesn't continue to slide down  09/17/21: Therapeutic Exs Pulleys into flexion and abduction x2 mins each returning therapist demo for each and tactile cues during to decrease Rt scapular compensation which pt struggled with Ball roll up wall into flexion x5 returning therapist demo Supine Scapular Series with yellow theraband x5 each returning therapist demo for each and VCs to decrease scapular compensation Manual Therapy STM in L S/L to R serratus anterior, lats, upper traps and rhomboids to help decrease pain and muscle spasms using cocoa butter - muscular tightness improved greatly from last session PROM to R shoulder in to flexion, abduction and ER with no impingement type pain only stretching across chest and axilla  09/10/21: STM in L s/L to R serratus anterior, lats, upper traps and rhomboids to help decrease pain and muscle spasms using cocoa butter - muscular tightness improved greatly from last session PROM to R shoulder in to flexion, abduction and ER with no impingement type pain only stretching across chest and axilla  09/07/21: STM in L s/L to R serratus anterior, lats, upper traps and rhomboids to help decrease pain and muscle spasms using cocoa butter - pts ROM improved greatly following STM Scapular mobilization in L S/L in all directions with increased tightness noted in all directions that  improved with therapy today PROM in supine to pt's tolerance in to flexion and abduction    PATIENT EDUCATION:  Education details: Supine Scapular Series  Person educated: Patient Education method: Explanation Education comprehension: verbalized understanding, returned demo and will benefit from further  review   HOME EXERCISE PROGRAM: Daughter will attempt bandaging:  Has link to " Lymphedema Management: Caregiver Bandaging you arm"  on youtube Supine Scapular series with yellow theraband  ASSESSMENT CLINICAL IMPRESSION: Assessed pt's progress towards her goals in therapy. Pt has less pain than she did and her ROM has improved. She has progressed towards all goals and met/partially met most goals. She feels she is ready for discharge from skilled PT services at this time. Encouraged pt to continue to do her exercises daily to prevent her from losing gains in ROM.   OBJECTIVE IMPAIRMENTS decreased ROM, increased edema, increased fascial restrictions, postural dysfunction, and pain.   ACTIVITY LIMITATIONS carrying and lifting  PARTICIPATION LIMITATIONS:  none  PERSONAL FACTORS Time since onset of injury/illness/exacerbation are also affecting patient's functional outcome.   REHAB POTENTIAL: Good  CLINICAL DECISION MAKING: Stable/uncomplicated  EVALUATION COMPLEXITY: Low  GOALS: Goals reviewed with patient? Yes  SHORT TERM GOALS = LONG TERM GOALS Target date: 09/02/2021    Pt will be independent in self MLD for long term management of lymphedema.  Baseline: no knowledge Goal status: MET 10/01/21  2.  Pt will demonstrate a 4 cm decrease in lymphedema at 10 cm proximal to olecranon to decrease risk of edema.  Baseline: 32.7 cm; 09/07/21- 31; 10/01/21- 31 Goal status: NOT MET  3.  Pt will obtain appropriate compression garments for long term management of lymphedema.  Baseline: none Goal status: MET 09/07/21 pt has day and night time garments  4.  Pt will improve R shoulder abduction to 150 degrees to allow her to reach out to the side.  Baseline: 120 degrees; 09/07/21- 115; 10/01/21- 133 Goal status: NOT MET  5.  Pt will report she is able to sleep through the night without being awoken by pain in R arm.  Baseline: wakes up due to pain in arm Goal status: PARTIALLY MET - pt  reports she is still awoken by pain occasionally but not as frequently as she was  6. Pt will be able to turn her body to look over her shoulder without muscle spasms.  Goal Status:MET 10/01/21 PLAN: PT FREQUENCY: 2x/week  PT DURATION: 4 weeks   PLANNED INTERVENTIONS: Therapeutic exercises, Therapeutic activity, Patient/Family education, Self Care, Joint mobilization, Orthotic/Fit training, Manual lymph drainage, Compression bandaging, scar mobilization, Vasopneumatic device, and Manual therapy  PLAN FOR NEXT SESSION: D/C this visit   Manus Gunning, PT 10/01/2021, 2:49 PM   PHYSICAL THERAPY DISCHARGE SUMMARY  Visits from Start of Care: 14  Current functional level related to goals / functional outcomes: See above   Remaining deficits: Pt still shy of her ROM goals but has progressed towards them, still has pain at night though not as frequent   Education / Equipment: MLD, compression garments, HEP   Patient agrees to discharge. Patient goals were partially met. Patient is being discharged due to being pleased with the current functional level.  Allyson Sabal [place], [place] 10/01/21 2:56 PM

## 2021-10-05 ENCOUNTER — Ambulatory Visit: Payer: Medicare HMO | Admitting: Physical Therapy

## 2021-10-06 ENCOUNTER — Other Ambulatory Visit (HOSPITAL_COMMUNITY): Payer: Self-pay

## 2021-10-07 ENCOUNTER — Other Ambulatory Visit (HOSPITAL_COMMUNITY): Payer: Self-pay

## 2021-10-08 ENCOUNTER — Other Ambulatory Visit (HOSPITAL_COMMUNITY): Payer: Self-pay

## 2021-10-08 ENCOUNTER — Ambulatory Visit: Payer: Medicare HMO | Admitting: Physical Therapy

## 2021-10-09 ENCOUNTER — Other Ambulatory Visit (HOSPITAL_COMMUNITY): Payer: Self-pay

## 2021-10-28 ENCOUNTER — Other Ambulatory Visit (HOSPITAL_COMMUNITY): Payer: Self-pay

## 2021-11-03 ENCOUNTER — Other Ambulatory Visit (HOSPITAL_COMMUNITY): Payer: Self-pay

## 2021-11-05 ENCOUNTER — Other Ambulatory Visit (HOSPITAL_COMMUNITY): Payer: Self-pay

## 2021-11-13 ENCOUNTER — Other Ambulatory Visit (HOSPITAL_COMMUNITY): Payer: Self-pay

## 2021-11-17 ENCOUNTER — Telehealth: Payer: Self-pay | Admitting: Pharmacy Technician

## 2021-11-17 ENCOUNTER — Other Ambulatory Visit (HOSPITAL_COMMUNITY): Payer: Self-pay

## 2021-11-17 NOTE — Telephone Encounter (Signed)
Oral Oncology Patient Advocate Encounter   Was successful in securing patient an $21,700 grant from [name]) to provide copayment coverage for Audrey [name].  This will keep the out of pocket expense at $0.     I have spoken with the patient.    The billing information is as follows and has been shared with Audrey [name].   Member ID: [id number] Group ID: [id number] RxBin: [id number] Dates of Eligibility: 08/19/21 through 11/17/22  Fund:  Audrey [name], Audrey [name] [name] Direct Number: [phone]  Fax: [phone]

## 2021-11-25 ENCOUNTER — Other Ambulatory Visit (HOSPITAL_COMMUNITY): Payer: Self-pay

## 2021-11-26 ENCOUNTER — Other Ambulatory Visit (HOSPITAL_COMMUNITY): Payer: Self-pay

## 2021-12-01 ENCOUNTER — Other Ambulatory Visit (HOSPITAL_COMMUNITY): Payer: Self-pay

## 2021-12-24 ENCOUNTER — Other Ambulatory Visit: Payer: Self-pay | Admitting: Hematology and Oncology

## 2021-12-24 ENCOUNTER — Other Ambulatory Visit (HOSPITAL_COMMUNITY): Payer: Self-pay

## 2021-12-24 ENCOUNTER — Other Ambulatory Visit: Payer: Self-pay

## 2021-12-24 DIAGNOSIS — Z170 Estrogen receptor positive status [ER+]: Secondary | ICD-10-CM

## 2021-12-24 MED ORDER — PALBOCICLIB 75 MG PO TABS
75.0000 mg | ORAL_TABLET | Freq: Every day | ORAL | 3 refills | Status: DC
  Filled 2021-12-24: qty 21, 28d supply, fill #0
  Filled 2022-01-19: qty 21, 28d supply, fill #1
  Filled 2022-02-16: qty 21, 28d supply, fill #2
  Filled 2022-03-19: qty 21, 28d supply, fill #3

## 2021-12-28 ENCOUNTER — Other Ambulatory Visit (HOSPITAL_COMMUNITY): Payer: Self-pay

## 2021-12-28 ENCOUNTER — Other Ambulatory Visit: Payer: Self-pay

## 2021-12-29 ENCOUNTER — Encounter (HOSPITAL_COMMUNITY): Payer: Self-pay

## 2021-12-29 ENCOUNTER — Other Ambulatory Visit: Payer: Self-pay

## 2021-12-29 ENCOUNTER — Ambulatory Visit (HOSPITAL_COMMUNITY)
Admission: RE | Admit: 2021-12-29 | Discharge: 2021-12-29 | Disposition: A | Discharge status: Home / Self Care | Payer: Medicare HMO | Source: Ambulatory Visit | Attending: Hematology and Oncology | Admitting: Hematology and Oncology

## 2021-12-29 ENCOUNTER — Inpatient Hospital Stay: Payer: Medicare HMO | Attending: Hematology and Oncology

## 2021-12-29 DIAGNOSIS — Z170 Estrogen receptor positive status [ER+]: Secondary | ICD-10-CM | POA: Diagnosis present

## 2021-12-29 DIAGNOSIS — Z880 Allergy status to penicillin: Secondary | ICD-10-CM | POA: Insufficient documentation

## 2021-12-29 DIAGNOSIS — Z885 Allergy status to narcotic agent status: Secondary | ICD-10-CM | POA: Insufficient documentation

## 2021-12-29 DIAGNOSIS — Z79899 Other long term (current) drug therapy: Secondary | ICD-10-CM | POA: Insufficient documentation

## 2021-12-29 DIAGNOSIS — R918 Other nonspecific abnormal finding of lung field: Secondary | ICD-10-CM | POA: Insufficient documentation

## 2021-12-29 DIAGNOSIS — R5383 Other fatigue: Secondary | ICD-10-CM | POA: Insufficient documentation

## 2021-12-29 DIAGNOSIS — R197 Diarrhea, unspecified: Secondary | ICD-10-CM | POA: Insufficient documentation

## 2021-12-29 DIAGNOSIS — Z881 Allergy status to other antibiotic agents status: Secondary | ICD-10-CM | POA: Insufficient documentation

## 2021-12-29 DIAGNOSIS — Z79811 Long term (current) use of aromatase inhibitors: Secondary | ICD-10-CM | POA: Insufficient documentation

## 2021-12-29 DIAGNOSIS — C50411 Malignant neoplasm of upper-outer quadrant of right female breast: Secondary | ICD-10-CM | POA: Diagnosis present

## 2021-12-29 DIAGNOSIS — C778 Secondary and unspecified malignant neoplasm of lymph nodes of multiple regions: Secondary | ICD-10-CM | POA: Insufficient documentation

## 2021-12-29 DIAGNOSIS — Z886 Allergy status to analgesic agent status: Secondary | ICD-10-CM | POA: Insufficient documentation

## 2021-12-29 DIAGNOSIS — C50919 Malignant neoplasm of unspecified site of unspecified female breast: Secondary | ICD-10-CM

## 2021-12-29 LAB — CMP (CANCER CENTER ONLY)
ALT: 12 U/L (ref 0–44)
AST: 17 U/L (ref 15–41)
Albumin: 4 g/dL (ref 3.5–5.0)
Alkaline Phosphatase: 73 U/L (ref 38–126)
Anion gap: 10 (ref 5–15)
BUN: 22 mg/dL (ref 8–23)
CO2: 21 mmol/L — ABNORMAL LOW (ref 22–32)
Calcium: 9.4 mg/dL (ref 8.9–10.3)
Chloride: 107 mmol/L (ref 98–111)
Creatinine: 1.25 mg/dL — ABNORMAL HIGH (ref 0.44–1.00)
GFR, Estimated: 44 mL/min — ABNORMAL LOW (ref >60)
Glucose, Bld: 91 mg/dL (ref 70–99)
Potassium: 4.2 mmol/L (ref 3.5–5.1)
Sodium: 138 mmol/L (ref 135–145)
Total Bilirubin: 0.5 mg/dL (ref 0.3–1.2)
Total Protein: 7 g/dL (ref 6.5–8.1)

## 2021-12-29 LAB — CBC WITH DIFFERENTIAL (CANCER CENTER ONLY)
Abs Immature Granulocytes: 0.01 10*3/uL (ref 0.00–0.07)
Basophils Absolute: 0.1 10*3/uL (ref 0.0–0.1)
Basophils Relative: 2 %
Eosinophils Absolute: 0 10*3/uL (ref 0.0–0.5)
Eosinophils Relative: 1 %
HCT: 30.7 % — ABNORMAL LOW (ref 36.0–46.0)
Hemoglobin: 10.6 g/dL — ABNORMAL LOW (ref 12.0–15.0)
Immature Granulocytes: 0 %
Lymphocytes Relative: 46 %
Lymphs Abs: 2.1 10*3/uL (ref 0.7–4.0)
MCH: 34.3 pg — ABNORMAL HIGH (ref 26.0–34.0)
MCHC: 34.5 g/dL (ref 30.0–36.0)
MCV: 99.4 fL (ref 80.0–100.0)
Monocytes Absolute: 0.3 10*3/uL (ref 0.1–1.0)
Monocytes Relative: 5 %
Neutro Abs: 2.2 10*3/uL (ref 1.7–7.7)
Neutrophils Relative %: 46 %
Platelet Count: 239 10*3/uL (ref 150–400)
RBC: 3.09 MIL/uL — ABNORMAL LOW (ref 3.87–5.11)
RDW: 13.6 % (ref 11.5–15.5)
WBC Count: 4.6 10*3/uL (ref 4.0–10.5)
nRBC: 0 % (ref 0.0–0.2)

## 2021-12-29 MED ORDER — IOHEXOL 300 MG/ML  SOLN
60.0000 mL | Freq: Once | INTRAMUSCULAR | Status: AC | PRN
  Administered 2021-12-29: 60 mL via INTRAVENOUS

## 2021-12-29 MED ORDER — SODIUM CHLORIDE (PF) 0.9 % IJ SOLN
INTRAMUSCULAR | Status: AC
  Filled 2021-12-29: qty 50

## 2021-12-30 LAB — CANCER ANTIGEN 27.29: CA 27.29: 209.2 U/mL — ABNORMAL HIGH (ref 0.0–38.6)

## 2021-12-30 NOTE — Progress Notes (Signed)
Patient Care Team: [name], MD as PCP - General (Family Medicine)  DIAGNOSIS: No diagnosis found.  SUMMARY OF ONCOLOGIC HISTORY: Oncology History  Malignant neoplasm of upper-outer quadrant of right breast in female, estrogen receptor positive ([place])  07/13/2019 Initial Diagnosis   History of right breast cancer on 2017. Chest CT on 06/06/19 showed a mass in the right axilla and a mass in the right lower lung lobe. PET scan on 07/03/19 showed a right lower lobe pulmonary nodule concerning for malignancy, the right axillary mass consistent with local breast cancer recurrence, and small hypermetabolic right axillary and neck nodes consistent with metastatic adenopathy. Biopsy of the right axilla on 07/13/19 showed carcinoma consistent with breast carcinoma, HER-2 negative (1+), ER+ 95%, PR+ 75%, Ki67 25%.   08/14/2019 -  Anti-estrogen oral therapy   Initially anastrozole alone,   Verzenio October 2022 (Verzenio discontinued for diarrhea), switched to [facility] 12/08/2020     CHIEF COMPLIANT:   INTERVAL HISTORY: Audrey [name] is a   ALLERGIES:  is allergic to ativan [lorazepam], cefuroxime, aspirin, codeine, and penicillins.  MEDICATIONS:  Current Outpatient Medications  Medication Sig Dispense Refill   albuterol (VENTOLIN HFA) 108 (90 Base) MCG/ACT inhaler 2 inhalations every 4-6 hours as needed for cough wheezing and shortness of breath     amLODipine (NORVASC) 2.5 MG tablet Take 2.5 mg by mouth daily.     anastrozole (ARIMIDEX) 1 MG tablet TAKE 1 TABLET EVERY DAY 90 tablet 3   cholecalciferol (VITAMIN D3) 25 MCG (1000 UNIT) tablet Take 1,000 Units by mouth daily.     cyclobenzaprine (FLEXERIL) 5 MG tablet Take 5 mg by mouth 3 (three) times daily as needed for muscle spasms.     dicyclomine (BENTYL) 20 MG tablet Take 20 mg by mouth 3 (three) times daily as needed for spasms.     hyoscyamine (LEVBID) 0.375 MG 12 hr tablet Take 0.375 mg by mouth every 12 (twelve) hours as needed for  cramping.     ketotifen (ZADITOR) 0.025 % ophthalmic solution Place 1 drop into both eyes 2 (two) times daily as needed (dry eyes).     omeprazole (PRILOSEC) 20 MG capsule Take 20 mg by mouth daily as needed (acid reflux).     ondansetron (ZOFRAN) 4 MG tablet Take 4 mg by mouth every 8 (eight) hours as needed for nausea or vomiting.     palbociclib (IBRANCE) 75 MG tablet Take 1 tablet (75 mg total) by mouth daily. Take for 21 days on, 7 days off, repeat every 28 days. 21 tablet 3   polyethylene glycol (MIRALAX / GLYCOLAX) 17 g packet Take 17 g by mouth daily as needed for moderate constipation.     No current facility-administered medications for this visit.    PHYSICAL EXAMINATION: ECOG PERFORMANCE STATUS: {CHL ONC ECOG [phone]}  There were no vitals filed for this visit. There were no vitals filed for this visit.  BREAST:*** No palpable masses or nodules in either right or left breasts. No palpable axillary supraclavicular or infraclavicular adenopathy no breast tenderness or nipple discharge. (exam performed in the presence of a chaperone)  LABORATORY DATA:  I have reviewed the data as listed    Latest Ref Rng & Units 12/29/2021    8:40 AM 09/29/2021   10:30 AM 07/27/2021   10:47 AM  CMP  Glucose 70 - 99 mg/dL 91  88  107   BUN 8 - 23 mg/dL _0 Creatinine 0.44 - 1.00  mg/dL 1.25  1.20  1.35   Sodium 135 - 145 mmol/L 138  139  138   Potassium 3.5 - 5.1 mmol/L 4.2  3.9  3.9   Chloride 98 - 111 mmol/L 107  105  106   CO2 22 - 32 mmol/L _0 Calcium 8.9 - 10.3 mg/dL 9.4  9.7  9.7   Total Protein 6.5 - 8.1 g/dL 7.0  7.2  7.1   Total Bilirubin 0.3 - 1.2 mg/dL 0.5  0.4  0.3   Alkaline Phos 38 - 126 U/L 73  75  72   AST 15 - 41 U/L _1 ALT 0 - 44 U/L _2 Lab Results  Component Value Date   WBC 4.6 12/29/2021   HGB 10.6 (L) 12/29/2021   HCT 30.7 (L) 12/29/2021   MCV 99.4 12/29/2021   PLT 239 12/29/2021   NEUTROABS 2.2 12/29/2021     ASSESSMENT & PLAN:  No problem-specific Assessment & Plan notes found for this encounter.    No orders of the defined types were placed in this encounter.  The patient has a good understanding of the overall plan. she agrees with it. she will call with any problems that may develop before the next visit here. Total time spent: 30 mins including face to face time and time spent for planning, charting and co-ordination of care   Suzzette Righter, [place] 12/30/21    I Gardiner Coins am acting as a Education administrator for [employer]  ***

## 2021-12-31 ENCOUNTER — Other Ambulatory Visit: Payer: Self-pay

## 2021-12-31 ENCOUNTER — Inpatient Hospital Stay: Payer: Medicare HMO | Admitting: Hematology and Oncology

## 2021-12-31 VITALS — BP 148/56 | HR 101 | Temp 97.9°F | Resp 18 | Ht 62.5 in | Wt 134.6 lb

## 2021-12-31 DIAGNOSIS — Z880 Allergy status to penicillin: Secondary | ICD-10-CM | POA: Diagnosis not present

## 2021-12-31 DIAGNOSIS — Z170 Estrogen receptor positive status [ER+]: Secondary | ICD-10-CM | POA: Diagnosis not present

## 2021-12-31 DIAGNOSIS — Z79899 Other long term (current) drug therapy: Secondary | ICD-10-CM | POA: Diagnosis not present

## 2021-12-31 DIAGNOSIS — Z881 Allergy status to other antibiotic agents status: Secondary | ICD-10-CM | POA: Diagnosis not present

## 2021-12-31 DIAGNOSIS — R5383 Other fatigue: Secondary | ICD-10-CM | POA: Diagnosis not present

## 2021-12-31 DIAGNOSIS — R197 Diarrhea, unspecified: Secondary | ICD-10-CM | POA: Diagnosis not present

## 2021-12-31 DIAGNOSIS — Z885 Allergy status to narcotic agent status: Secondary | ICD-10-CM | POA: Diagnosis not present

## 2021-12-31 DIAGNOSIS — C50411 Malignant neoplasm of upper-outer quadrant of right female breast: Secondary | ICD-10-CM

## 2021-12-31 DIAGNOSIS — C778 Secondary and unspecified malignant neoplasm of lymph nodes of multiple regions: Secondary | ICD-10-CM | POA: Diagnosis not present

## 2021-12-31 DIAGNOSIS — Z79811 Long term (current) use of aromatase inhibitors: Secondary | ICD-10-CM | POA: Diagnosis not present

## 2021-12-31 DIAGNOSIS — Z886 Allergy status to analgesic agent status: Secondary | ICD-10-CM | POA: Diagnosis not present

## 2021-12-31 DIAGNOSIS — R918 Other nonspecific abnormal finding of lung field: Secondary | ICD-10-CM | POA: Diagnosis not present

## 2021-12-31 MED ORDER — AZITHROMYCIN 250 MG PO TABS: ORAL_TABLET | ORAL | 0 refills | Status: DC

## 2021-12-31 NOTE — Assessment & Plan Note (Addendum)
H/O right breast cancer on 2017 and 1999: Lumpectomy.  (Bilateral mastectomies in 2017: Seen by Dr. Lavera Guise, could not tolerate antiestrogen therapy) Chest CT on 06/06/19 showed a mass in the right axilla and a mass in the right lower lung lobe. PET scan on 07/03/19 showed a right lower lobe pulmonary nodule concerning for malignancy, the right axillary mass consistent with local breast cancer recurrence, and small hypermetabolic right axillary and neck nodes consistent with metastatic adenopathy. Biopsy of the right axilla on 07/13/19 showed carcinoma consistent with breast carcinoma, HER-2 negative (1+), ER+ 95%, PR+ 75%, Ki67 25%. ---------------------------------------------------------------------------------------------------------------------------------------------- Current treatment: Anastrozole 1 mg daily started 08/14/2019 with Leslee Home (Verzinio was discontinued because of diarrhea) Ibrance toxicities: Labs have been reviewed 1.  Diverticulitis: Intermittent diarrhea 2. no evidence of leukopenia. 3.  Fatigue: She would like to take a lower dosage of Ibrance.  We will reduce the dosage to 75 mg 3 weeks on 1 week off starting 05/25/2021.   12/29/2021: CT chest: To my evaluation the lung nodules appear to be the same size.  We are awaiting the final report from radiology.  Increasing the CA 27-29 levels: The tumor markers are increasing.  This is concerning for progression.  I would like to obtain a CT abdomen and pelvis as well as a bone scan for further assessment.   I would like to see her back after the scans are performed to discuss the results and to decide if a change in treatment is warranted.  I briefly discussed with him about Faslodex and asked them to read more about this injection and that could replace aromatase inhibitor therapy.

## 2022-01-07 ENCOUNTER — Ambulatory Visit (HOSPITAL_COMMUNITY)
Admission: RE | Admit: 2022-01-07 | Discharge: 2022-01-07 | Disposition: A | Discharge status: Home / Self Care | Payer: Medicare HMO | Source: Ambulatory Visit | Attending: Hematology and Oncology | Admitting: Hematology and Oncology

## 2022-01-07 DIAGNOSIS — C50411 Malignant neoplasm of upper-outer quadrant of right female breast: Secondary | ICD-10-CM | POA: Insufficient documentation

## 2022-01-07 DIAGNOSIS — Z170 Estrogen receptor positive status [ER+]: Secondary | ICD-10-CM | POA: Diagnosis present

## 2022-01-07 MED ORDER — IOHEXOL 300 MG/ML  SOLN
100.0000 mL | Freq: Once | INTRAMUSCULAR | Status: AC | PRN
  Administered 2022-01-07: 100 mL via INTRAVENOUS

## 2022-01-07 MED ORDER — SODIUM CHLORIDE (PF) 0.9 % IJ SOLN
INTRAMUSCULAR | Status: AC
  Filled 2022-01-07: qty 50

## 2022-01-08 ENCOUNTER — Encounter (HOSPITAL_COMMUNITY)
Admission: RE | Admit: 2022-01-08 | Discharge: 2022-01-08 | Disposition: A | Discharge status: Home / Self Care | Payer: Medicare HMO | Source: Ambulatory Visit | Attending: Hematology and Oncology | Admitting: Hematology and Oncology

## 2022-01-08 DIAGNOSIS — Z170 Estrogen receptor positive status [ER+]: Secondary | ICD-10-CM | POA: Diagnosis present

## 2022-01-08 DIAGNOSIS — C50411 Malignant neoplasm of upper-outer quadrant of right female breast: Secondary | ICD-10-CM | POA: Insufficient documentation

## 2022-01-08 MED ORDER — TECHNETIUM TC 99M MEDRONATE IV KIT
22.0000 | PACK | Freq: Once | INTRAVENOUS | Status: AC
  Administered 2022-01-08: 22 via INTRAVENOUS

## 2022-01-09 NOTE — Progress Notes (Incomplete)
Patient Care Team: [name], MD as PCP - General (Family Medicine)  DIAGNOSIS: No diagnosis found.  SUMMARY OF ONCOLOGIC HISTORY: Oncology History  Malignant neoplasm of upper-outer quadrant of right breast in female, estrogen receptor positive ([facility])  07/13/2019 Initial Diagnosis   History of right breast cancer on 2017. Chest CT on 06/06/19 showed a mass in the right axilla and a mass in the right lower lung lobe. PET scan on 07/03/19 showed a right lower lobe pulmonary nodule concerning for malignancy, the right axillary mass consistent with local breast cancer recurrence, and small hypermetabolic right axillary and neck nodes consistent with metastatic adenopathy. Biopsy of the right axilla on 07/13/19 showed carcinoma consistent with breast carcinoma, HER-2 negative (1+), ER+ 95%, PR+ 75%, Ki67 25%.   08/14/2019 -  Anti-estrogen oral therapy   Initially anastrozole alone,   Verzenio October 2022 (Verzenio discontinued for diarrhea), switched to [facility] 12/08/2020     CHIEF COMPLIANT: Follow-up to review scans  INTERVAL HISTORY: Audrey [name] is a 81 year old above-mentioned history of metastatic breast cancer on anastrozole. She presents to the clinic today for a follow-up.   ALLERGIES:  is allergic to ativan [lorazepam], cefuroxime, aspirin, codeine, and penicillins.  MEDICATIONS:  Current Outpatient Medications  Medication Sig Dispense Refill   albuterol (VENTOLIN HFA) 108 (90 Base) MCG/ACT inhaler 2 inhalations every 4-6 hours as needed for cough wheezing and shortness of breath     amLODipine (NORVASC) 2.5 MG tablet Take 2.5 mg by mouth daily.     anastrozole (ARIMIDEX) 1 MG tablet TAKE 1 TABLET EVERY DAY 90 tablet 3   azithromycin (ZITHROMAX Z-PAK) 250 MG tablet Use as directed 6 each 0   cholecalciferol (VITAMIN D3) 25 MCG (1000 UNIT) tablet Take 1,000 Units by mouth daily.     cyclobenzaprine (FLEXERIL) 5 MG tablet Take 5 mg by mouth 3 (three) times daily as needed  for muscle spasms.     dicyclomine (BENTYL) 20 MG tablet Take 20 mg by mouth 3 (three) times daily as needed for spasms.     hyoscyamine (LEVBID) 0.375 MG 12 hr tablet Take 0.375 mg by mouth every 12 (twelve) hours as needed for cramping.     ketotifen (ZADITOR) 0.025 % ophthalmic solution Place 1 drop into both eyes 2 (two) times daily as needed (dry eyes).     omeprazole (PRILOSEC) 20 MG capsule Take 20 mg by mouth daily as needed (acid reflux).     ondansetron (ZOFRAN) 4 MG tablet Take 4 mg by mouth every 8 (eight) hours as needed for nausea or vomiting.     palbociclib (IBRANCE) 75 MG tablet Take 1 tablet (75 mg total) by mouth daily. Take for 21 days on, 7 days off, repeat every 28 days. 21 tablet 3   polyethylene glycol (MIRALAX / GLYCOLAX) 17 g packet Take 17 g by mouth daily as needed for moderate constipation.     Zoster Vaccine Adjuvanted [facility]) injection Inject 0.5 mLs into the muscle.     No current facility-administered medications for this visit.    PHYSICAL EXAMINATION: ECOG PERFORMANCE STATUS: {CHL ONC ECOG [phone]}  There were no vitals filed for this visit. There were no vitals filed for this visit.  BREAST:*** No palpable masses or nodules in either right or left breasts. No palpable axillary supraclavicular or infraclavicular adenopathy no breast tenderness or nipple discharge. (exam performed in the presence of a chaperone)  LABORATORY DATA:  I have reviewed the data as listed    Latest Ref  Rng & Units 12/29/2021    8:40 AM 09/29/2021   10:30 AM 07/27/2021   10:47 AM  CMP  Glucose 70 - 99 mg/dL 91  88  107   BUN 8 - 23 mg/dL _0 Creatinine 0.44 - 1.00 mg/dL 1.25  1.20  1.35   Sodium 135 - 145 mmol/L 138  139  138   Potassium 3.5 - 5.1 mmol/L 4.2  3.9  3.9   Chloride 98 - 111 mmol/L 107  105  106   CO2 22 - 32 mmol/L _1 Calcium 8.9 - 10.3 mg/dL 9.4  9.7  9.7   Total Protein 6.5 - 8.1 g/dL 7.0  7.2  7.1   Total Bilirubin 0.3 - 1.2  mg/dL 0.5  0.4  0.3   Alkaline Phos 38 - 126 U/L 73  75  72   AST 15 - 41 U/L _2 ALT 0 - 44 U/L _3 Lab Results  Component Value Date   WBC 4.6 12/29/2021   HGB 10.6 (L) 12/29/2021   HCT 30.7 (L) 12/29/2021   MCV 99.4 12/29/2021   PLT 239 12/29/2021   NEUTROABS 2.2 12/29/2021    ASSESSMENT & PLAN:  No problem-specific Assessment & Plan notes found for this encounter.    No orders of the defined types were placed in this encounter.  The patient has a good understanding of the overall plan. she agrees with it. she will call with any problems that may develop before the next visit here. Total time spent: 30 mins including face to face time and time spent for planning, charting and co-ordination of care   Suzzette Righter, [place] 01/09/22    I Gardiner Coins am acting as a Education administrator for [employer]  ***

## 2022-01-13 ENCOUNTER — Inpatient Hospital Stay: Payer: Medicare HMO | Attending: Hematology and Oncology | Admitting: Hematology and Oncology

## 2022-01-13 ENCOUNTER — Telehealth: Payer: Self-pay | Admitting: Pharmacy Technician

## 2022-01-13 ENCOUNTER — Other Ambulatory Visit (HOSPITAL_COMMUNITY): Payer: Self-pay

## 2022-01-13 ENCOUNTER — Encounter: Payer: Self-pay | Admitting: Hematology and Oncology

## 2022-01-13 ENCOUNTER — Other Ambulatory Visit: Payer: Self-pay

## 2022-01-13 VITALS — BP 143/59 | HR 88 | Temp 97.5°F | Resp 18 | Ht 62.5 in | Wt 135.1 lb

## 2022-01-13 DIAGNOSIS — Z881 Allergy status to other antibiotic agents status: Secondary | ICD-10-CM | POA: Diagnosis not present

## 2022-01-13 DIAGNOSIS — Z9013 Acquired absence of bilateral breasts and nipples: Secondary | ICD-10-CM | POA: Diagnosis not present

## 2022-01-13 DIAGNOSIS — Z886 Allergy status to analgesic agent status: Secondary | ICD-10-CM | POA: Diagnosis not present

## 2022-01-13 DIAGNOSIS — Z885 Allergy status to narcotic agent status: Secondary | ICD-10-CM | POA: Insufficient documentation

## 2022-01-13 DIAGNOSIS — Z79811 Long term (current) use of aromatase inhibitors: Secondary | ICD-10-CM | POA: Insufficient documentation

## 2022-01-13 DIAGNOSIS — Z170 Estrogen receptor positive status [ER+]: Secondary | ICD-10-CM | POA: Diagnosis not present

## 2022-01-13 DIAGNOSIS — Z880 Allergy status to penicillin: Secondary | ICD-10-CM | POA: Diagnosis not present

## 2022-01-13 DIAGNOSIS — C50411 Malignant neoplasm of upper-outer quadrant of right female breast: Secondary | ICD-10-CM | POA: Insufficient documentation

## 2022-01-13 DIAGNOSIS — R5383 Other fatigue: Secondary | ICD-10-CM | POA: Insufficient documentation

## 2022-01-13 DIAGNOSIS — Z79899 Other long term (current) drug therapy: Secondary | ICD-10-CM | POA: Insufficient documentation

## 2022-01-13 DIAGNOSIS — C7951 Secondary malignant neoplasm of bone: Secondary | ICD-10-CM | POA: Insufficient documentation

## 2022-01-13 DIAGNOSIS — R911 Solitary pulmonary nodule: Secondary | ICD-10-CM | POA: Diagnosis not present

## 2022-01-13 DIAGNOSIS — R197 Diarrhea, unspecified: Secondary | ICD-10-CM | POA: Insufficient documentation

## 2022-01-13 NOTE — Telephone Encounter (Signed)
Oral Oncology Patient Advocate Encounter  Was successful in securing patient a $15,000 grant from [employer] to provide copayment coverage for [place].  This will keep the out of pocket expense at $0.     [employer] ID: [id number]  I have spoken with the patient.   The billing information is as follows and has been shared with [facility].    RxBin: [id number] PCN: [id number] Member ID: [id number] Group ID: [id number] Dates of Eligibility: 12/23/21 through 12/23/22  Fund:  [place], CPhT-Adv Oncology Pharmacy Patient [place] Direct Number: [phone]  Fax: [phone]

## 2022-01-13 NOTE — Assessment & Plan Note (Signed)
H/O right breast cancer on 2017 and 1999: Lumpectomy.  (Bilateral mastectomies in 2017: Seen by Dr. Lavera Guise, could not tolerate antiestrogen therapy) Chest CT on 06/06/19 showed a mass in the right axilla and a mass in the right lower lung lobe. PET scan on 07/03/19 showed a right lower lobe pulmonary nodule concerning for malignancy, the right axillary mass consistent with local breast cancer recurrence, and small hypermetabolic right axillary and neck nodes consistent with metastatic adenopathy. Biopsy of the right axilla on 07/13/19 showed carcinoma consistent with breast carcinoma, HER-2 negative (1+), ER+ 95%, PR+ 75%, Ki67 25%. ---------------------------------------------------------------------------------------------------------------------------------------------- Current treatment: Anastrozole 1 mg daily started 08/14/2019 with Leslee Home (Verzinio was discontinued because of diarrhea) Ibrance toxicities: Labs have been reviewed 1.  Diverticulitis: Intermittent diarrhea 2. no evidence of leukopenia. 3.  Fatigue: She would like to take a lower dosage of Ibrance.  We will reduce the dosage to 75 mg 3 weeks on 1 week off starting 05/25/2021.   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  Interval enlargement of pulmonary nodule 1.3 cm was previously 7 mm   Discussion: Ideally we will get a biopsy of one of the metastatic nodules.  I will request pulmonary to evaluate the lung nodule for biopsy if possible. [employer] to evaluate if the patient has a PIK3CA mutation Treatment options include Faslodex with Ibrance versus Enhertu (HER2 was 1+ in 2021) versus Xeloda

## 2022-01-19 ENCOUNTER — Other Ambulatory Visit (HOSPITAL_COMMUNITY): Payer: Self-pay

## 2022-01-20 ENCOUNTER — Other Ambulatory Visit: Payer: Self-pay

## 2022-01-20 ENCOUNTER — Inpatient Hospital Stay: Payer: Medicare HMO

## 2022-01-20 VITALS — BP 182/64 | HR 92 | Temp 98.2°F | Resp 18

## 2022-01-20 DIAGNOSIS — Z170 Estrogen receptor positive status [ER+]: Secondary | ICD-10-CM

## 2022-01-20 DIAGNOSIS — C50411 Malignant neoplasm of upper-outer quadrant of right female breast: Secondary | ICD-10-CM | POA: Diagnosis not present

## 2022-01-20 MED ORDER — FULVESTRANT 250 MG/5ML IM SOSY
500.0000 mg | PREFILLED_SYRINGE | Freq: Once | INTRAMUSCULAR | Status: AC
  Administered 2022-01-20: 500 mg via INTRAMUSCULAR
  Filled 2022-01-20: qty 10

## 2022-01-21 ENCOUNTER — Other Ambulatory Visit (HOSPITAL_COMMUNITY): Payer: Self-pay

## 2022-01-25 ENCOUNTER — Other Ambulatory Visit (HOSPITAL_COMMUNITY): Payer: Self-pay

## 2022-02-03 ENCOUNTER — Other Ambulatory Visit: Payer: Self-pay

## 2022-02-03 ENCOUNTER — Inpatient Hospital Stay: Payer: Medicare HMO

## 2022-02-03 VITALS — BP 154/70 | HR 94 | Temp 98.6°F | Resp 18

## 2022-02-03 DIAGNOSIS — C50411 Malignant neoplasm of upper-outer quadrant of right female breast: Secondary | ICD-10-CM | POA: Diagnosis not present

## 2022-02-03 DIAGNOSIS — Z170 Estrogen receptor positive status [ER+]: Secondary | ICD-10-CM

## 2022-02-03 MED ORDER — FULVESTRANT 250 MG/5ML IM SOSY
500.0000 mg | PREFILLED_SYRINGE | Freq: Once | INTRAMUSCULAR | Status: AC
  Administered 2022-02-03: 500 mg via INTRAMUSCULAR
  Filled 2022-02-03: qty 10

## 2022-02-03 NOTE — Patient Instructions (Signed)

## 2022-02-16 ENCOUNTER — Other Ambulatory Visit (HOSPITAL_COMMUNITY): Payer: Self-pay

## 2022-02-18 ENCOUNTER — Encounter: Payer: Self-pay | Admitting: Adult Health

## 2022-02-18 ENCOUNTER — Inpatient Hospital Stay: Payer: Medicare HMO | Attending: Hematology and Oncology

## 2022-02-18 ENCOUNTER — Other Ambulatory Visit (HOSPITAL_COMMUNITY): Payer: Self-pay

## 2022-02-18 ENCOUNTER — Inpatient Hospital Stay: Payer: Medicare HMO

## 2022-02-18 ENCOUNTER — Inpatient Hospital Stay (HOSPITAL_BASED_OUTPATIENT_CLINIC_OR_DEPARTMENT_OTHER): Payer: Medicare HMO | Admitting: Adult Health

## 2022-02-18 VITALS — BP 152/66 | HR 94 | Temp 97.3°F | Resp 16 | Wt 135.5 lb

## 2022-02-18 DIAGNOSIS — Z170 Estrogen receptor positive status [ER+]: Secondary | ICD-10-CM | POA: Diagnosis not present

## 2022-02-18 DIAGNOSIS — Z9049 Acquired absence of other specified parts of digestive tract: Secondary | ICD-10-CM | POA: Diagnosis not present

## 2022-02-18 DIAGNOSIS — F17210 Nicotine dependence, cigarettes, uncomplicated: Secondary | ICD-10-CM | POA: Diagnosis not present

## 2022-02-18 DIAGNOSIS — Z79899 Other long term (current) drug therapy: Secondary | ICD-10-CM | POA: Insufficient documentation

## 2022-02-18 DIAGNOSIS — M79605 Pain in left leg: Secondary | ICD-10-CM | POA: Insufficient documentation

## 2022-02-18 DIAGNOSIS — C778 Secondary and unspecified malignant neoplasm of lymph nodes of multiple regions: Secondary | ICD-10-CM | POA: Insufficient documentation

## 2022-02-18 DIAGNOSIS — Z90710 Acquired absence of both cervix and uterus: Secondary | ICD-10-CM | POA: Diagnosis not present

## 2022-02-18 DIAGNOSIS — C50411 Malignant neoplasm of upper-outer quadrant of right female breast: Secondary | ICD-10-CM | POA: Diagnosis present

## 2022-02-18 DIAGNOSIS — Z8616 Personal history of COVID-19: Secondary | ICD-10-CM | POA: Insufficient documentation

## 2022-02-18 LAB — CBC WITH DIFFERENTIAL (CANCER CENTER ONLY)
Abs Immature Granulocytes: 0.01 10*3/uL (ref 0.00–0.07)
Basophils Absolute: 0.1 10*3/uL (ref 0.0–0.1)
Basophils Relative: 2 %
Eosinophils Absolute: 0 10*3/uL (ref 0.0–0.5)
Eosinophils Relative: 1 %
HCT: 31.3 % — ABNORMAL LOW (ref 36.0–46.0)
Hemoglobin: 11 g/dL — ABNORMAL LOW (ref 12.0–15.0)
Immature Granulocytes: 0 %
Lymphocytes Relative: 48 %
Lymphs Abs: 2.3 10*3/uL (ref 0.7–4.0)
MCH: 35.3 pg — ABNORMAL HIGH (ref 26.0–34.0)
MCHC: 35.1 g/dL (ref 30.0–36.0)
MCV: 100.3 fL — ABNORMAL HIGH (ref 80.0–100.0)
Monocytes Absolute: 0.2 10*3/uL (ref 0.1–1.0)
Monocytes Relative: 5 %
Neutro Abs: 2.1 10*3/uL (ref 1.7–7.7)
Neutrophils Relative %: 44 %
Platelet Count: 240 10*3/uL (ref 150–400)
RBC: 3.12 MIL/uL — ABNORMAL LOW (ref 3.87–5.11)
RDW: 14.3 % (ref 11.5–15.5)
Smear Review: NORMAL
WBC Count: 4.7 10*3/uL (ref 4.0–10.5)
nRBC: 0 % (ref 0.0–0.2)

## 2022-02-18 LAB — CMP (CANCER CENTER ONLY)
ALT: 10 U/L (ref 0–44)
AST: 14 U/L — ABNORMAL LOW (ref 15–41)
Albumin: 4.2 g/dL (ref 3.5–5.0)
Alkaline Phosphatase: 83 U/L (ref 38–126)
Anion gap: 7 (ref 5–15)
BUN: 21 mg/dL (ref 8–23)
CO2: 27 mmol/L (ref 22–32)
Calcium: 9.9 mg/dL (ref 8.9–10.3)
Chloride: 106 mmol/L (ref 98–111)
Creatinine: 1.18 mg/dL — ABNORMAL HIGH (ref 0.44–1.00)
GFR, Estimated: 47 mL/min — ABNORMAL LOW (ref >60)
Glucose, Bld: 78 mg/dL (ref 70–99)
Potassium: 4.2 mmol/L (ref 3.5–5.1)
Sodium: 140 mmol/L (ref 135–145)
Total Bilirubin: 0.4 mg/dL (ref 0.3–1.2)
Total Protein: 6.6 g/dL (ref 6.5–8.1)

## 2022-02-18 MED ORDER — FULVESTRANT 250 MG/5ML IM SOSY
500.0000 mg | PREFILLED_SYRINGE | Freq: Once | INTRAMUSCULAR | Status: AC
  Administered 2022-02-18: 500 mg via INTRAMUSCULAR
  Filled 2022-02-18: qty 10

## 2022-02-18 NOTE — Assessment & Plan Note (Signed)
Audrey [name] is a 82 year old woman with ER positive metastatic breast cancer here today for follow-up and evaluation after starting on Faslodex injections due to disease progression noted on scans.  She is tolerating her treatments well and I did recommend that she let the girls in the injection room know about the leg pain that she had as perhaps the injection could be warmed a little bit more they could be more mindful of their placement today.  Of course it could be completely unrelated because she does have other musculoskeletal issues.  We reviewed her imaging and restaging and I discussed that that would likely be late April or early May.  She will return in 4 weeks for an injection only and in 8 weeks for labs, follow-up, and her next injection.  That visit will be with Dr. Lindi Adie so he can review how she is tolerating this medication and also so he can order restaging scans.  We did review that she will stop taking anastrozole and I sent a message to Dr. Lindi Adie about this.

## 2022-02-18 NOTE — Progress Notes (Signed)
[facility] Follow up:    Audrey Lewandowsky, MD [address] [place] [address]   DIAGNOSIS: Metastatic breast cancer  SUMMARY OF ONCOLOGIC HISTORY: Oncology History  Malignant neoplasm of upper-outer quadrant of right breast in female, estrogen receptor positive ([facility])  07/13/2019 Initial Diagnosis   History of right breast cancer on 2017. Chest CT on 06/06/19 showed a mass in the right axilla and a mass in the right lower lung lobe. PET scan on 07/03/19 showed a right lower lobe pulmonary nodule concerning for malignancy, the right axillary mass consistent with local breast cancer recurrence, and small hypermetabolic right axillary and neck nodes consistent with metastatic adenopathy. Biopsy of the right axilla on 07/13/19 showed carcinoma consistent with breast carcinoma, HER-2 negative (1+), ER+ 95%, PR+ 75%, Ki67 25%.   08/14/2019 -  Anti-estrogen oral therapy   Initially anastrozole alone,   Verzenio October 2022 (Verzenio discontinued for diarrhea), switched to [facility] 12/08/2020     CURRENT THERAPY: Faslodex and Leslee Home  INTERVAL HISTORY: Audrey [name] 82 y.o. female returns for follow-up after beginning Faslodex injections.  She continues on Ibrance 75 mg 3 weeks on and 1 week off.  She is tolerating this well.  She tells me her injections are going well however she did experience after her last injection that her left leg hurt for a couple days after.  It is since resolved.  She also notes some left posterior chest wall pain that she notices if she moves in the wrong way.  If this happens she will take Advil.  This happens a couple of times a week.  We could not find a CT correlate for any cancer in this vicinity.  She is otherwise doing well.   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  MEDICAL HISTORY: Past Medical History:  Diagnosis Date   Anxiety    Arthritis    "hands" (02/25/2014)   Borderline type 2 diabetes mellitus    Breast cancer, right breast ([place]) 1999   recurrence 08/2017   COVID    March 2021 and early 2022   GERD (gastroesophageal reflux disease)    High cholesterol    "can't take the RX; makes my bones ache" (02/25/2014)   History of hiatal hernia    History of stomach ulcers    Hypertension    Migraines    hx   Pneumonia    PONV (postoperative nausea and vomiting)    at last colonoscopy she was told she was hard to awaken   Shortness of breath dyspnea    w/ exertion     SURGICAL HISTORY: Past Surgical History:  Procedure Laterality Date   APPENDECTOMY     BREAST BIOPSY Right 1999   BREAST LUMPECTOMY Right 1999   BRONCHIAL BIOPSY  12/29/2020   Procedure: BRONCHIAL BIOPSIES;  Surgeon: Collene Gobble, MD;  Location: [facility] ENDOSCOPY;  Service: Pulmonary;;   BRONCHIAL BRUSHINGS  12/29/2020   Procedure: BRONCHIAL BRUSHINGS;  Surgeon: Collene Gobble, MD;  Location: [facility] ENDOSCOPY;  Service: Pulmonary;;   BRONCHIAL NEEDLE ASPIRATION BIOPSY  12/29/2020   Procedure: BRONCHIAL NEEDLE ASPIRATION BIOPSIES;  Surgeon: Collene Gobble, MD;  Location: [facility] ENDOSCOPY;  Service: Pulmonary;;  BRONCHIAL WASHINGS  12/29/2020   Procedure: BRONCHIAL WASHINGS;  Surgeon: Collene Gobble, MD;  Location: [facility] ENDOSCOPY;  Service: Pulmonary;;   DILATION AND CURETTAGE OF UTERUS     FINE NEEDLE ASPIRATION  12/29/2020   Procedure: FINE NEEDLE ASPIRATION (FNA) LINEAR;  Surgeon: Collene Gobble, MD;  Location: [facility] ENDOSCOPY;  Service: Pulmonary;;   PARTIAL KNEE ARTHROPLASTY Left 02/25/2014   Procedure: LEFT UNICOMPARTMENTAL (medial compartment) KNEE;  Surgeon: Vickey Huger, MD;  Location: [facility];  Service: Orthopedics;  Laterality: Left;    REPLACEMENT UNICONDYLAR JOINT KNEE Left 02/25/2014   TONSILLECTOMY     VAGINAL HYSTERECTOMY     VIDEO BRONCHOSCOPY WITH ENDOBRONCHIAL ULTRASOUND N/A 12/29/2020   Procedure: VIDEO BRONCHOSCOPY WITH ENDOBRONCHIAL ULTRASOUND;  Surgeon: Collene Gobble, MD;  Location: [facility] ENDOSCOPY;  Service: Pulmonary;  Laterality: N/A;   VIDEO BRONCHOSCOPY WITH RADIAL ENDOBRONCHIAL ULTRASOUND  12/29/2020   Procedure: RADIAL ENDOBRONCHIAL ULTRASOUND;  Surgeon: Collene Gobble, MD;  Location: [facility] ENDOSCOPY;  Service: Pulmonary;;    SOCIAL HISTORY: Social History   Socioeconomic History   Marital status: Widowed    Spouse name: Not on file   Number of children: Not on file   Years of education: Not on file   Highest education level: Not on file  Occupational History   Not on file  Tobacco Use   Smoking status: Some Days    Packs/day: 0.75    Years: 10.00    Total pack years: 7.50    Types: Cigarettes    Start date: 11/05/2002    Last attempt to quit: 02/12/2011    Years since quitting: 11.0   Smokeless tobacco: Never   Tobacco comments:    Smokes 7 - 10 cigarettes a day ARJ 12/18/20  Substance and Sexual Activity   Alcohol use: No   Drug use: No   Sexual activity: Never  Other Topics Concern   Not on file  Social History Narrative   Not on file   Social Determinants of Health   Financial Resource Strain: Not on file  Food Insecurity: Not on file  Transportation Needs: Not on file  Physical Activity: Not on file  Stress: Not on file  Social Connections: Not on file  Intimate Partner Violence: Not on file    FAMILY HISTORY: History reviewed. No pertinent family history.  Review of Systems  Constitutional:  Negative for appetite change, chills, fatigue, fever and unexpected weight change.  HENT:   Negative for hearing loss, lump/mass and trouble swallowing.   Eyes:  Negative for eye problems and icterus.  Respiratory:  Negative for chest tightness, cough and shortness of breath.    Cardiovascular:  Negative for chest pain, leg swelling and palpitations.  Gastrointestinal:  Negative for abdominal distention, abdominal pain, constipation, diarrhea, nausea and vomiting.  Endocrine: Negative for hot flashes.  Genitourinary:  Negative for difficulty urinating.   Musculoskeletal:  Negative for arthralgias.  Skin:  Negative for itching and rash.  Neurological:  Negative for dizziness, extremity weakness, headaches and numbness.  Hematological:  Negative for adenopathy. Does not bruise/bleed easily.  Psychiatric/Behavioral:  Negative for depression. The patient is not nervous/anxious.       PHYSICAL EXAMINATION  ECOG PERFORMANCE STATUS: 1 - Symptomatic but completely ambulatory  Vitals:   02/18/22 1056  BP: (!) 152/66  Pulse: 94  Resp: 16  Temp: (!) 97.3 F (36.3 C)  SpO2: 100%    Physical Exam Constitutional:      General: She is not in acute distress.  Appearance: Normal appearance. She is not toxic-appearing.  HENT:     Head: Normocephalic and atraumatic.  Eyes:     General: No scleral icterus. Cardiovascular:     Rate and Rhythm: Normal rate and regular rhythm.     Pulses: Normal pulses.     Heart sounds: Normal heart sounds.  Pulmonary:     Effort: Pulmonary effort is normal.     Breath sounds: Normal breath sounds.  Abdominal:     General: Abdomen is flat. Bowel sounds are normal. There is no distension.     Palpations: Abdomen is soft.     Tenderness: There is no abdominal tenderness.  Musculoskeletal:        General: No swelling.     Cervical back: Neck supple.  Lymphadenopathy:     Cervical: No cervical adenopathy.  Skin:    General: Skin is warm and dry.     Findings: No rash.  Neurological:     General: No focal deficit present.     Mental Status: She is alert.  Psychiatric:        Mood and Affect: Mood normal.        Behavior: Behavior normal.     LABORATORY DATA:  CBC    Component Value Date/Time   WBC 4.7 02/18/2022  1034   WBC 10.1 07/13/2019 1130   RBC 3.12 (L) 02/18/2022 1034   HGB 11.0 (L) 02/18/2022 1034   HCT 31.3 (L) 02/18/2022 1034   PLT 240 02/18/2022 1034   MCV 100.3 (H) 02/18/2022 1034   MCH 35.3 (H) 02/18/2022 1034   MCHC 35.1 02/18/2022 1034   RDW 14.3 02/18/2022 1034   LYMPHSABS 2.3 02/18/2022 1034   MONOABS 0.2 02/18/2022 1034   EOSABS 0.0 02/18/2022 1034   BASOSABS 0.1 02/18/2022 1034    CMP     Component Value Date/Time   NA 140 02/18/2022 1034   K 4.2 02/18/2022 1034   CL 106 02/18/2022 1034   CO2 27 02/18/2022 1034   GLUCOSE 78 02/18/2022 1034   BUN 21 02/18/2022 1034   CREATININE 1.18 (H) 02/18/2022 1034   CALCIUM 9.9 02/18/2022 1034   PROT 6.6 02/18/2022 1034   ALBUMIN 4.2 02/18/2022 1034   AST 14 (L) 02/18/2022 1034   ALT 10 02/18/2022 1034   ALKPHOS 83 02/18/2022 1034   BILITOT 0.4 02/18/2022 1034   GFRNONAA 47 (L) 02/18/2022 1034   GFRAA >60 07/13/2019 1130        ASSESSMENT and THERAPY PLAN:   Malignant neoplasm of upper-outer quadrant of right breast in female, estrogen receptor positive ([facility]) Bea is a 82 year old woman with ER positive metastatic breast cancer here today for follow-up and evaluation after starting on Faslodex injections due to disease progression noted on scans.  She is tolerating her treatments well and I did recommend that she let the girls in the injection room know about the leg pain that she had as perhaps the injection could be warmed a little bit more they could be more mindful of their placement today.  Of course it could be completely unrelated because she does have other musculoskeletal issues.  We reviewed her imaging and restaging and I discussed that that would likely be late April or early May.  She will return in 4 weeks for an injection only and in 8 weeks for labs, follow-up, and her next injection.  That visit will be with Dr. Lindi Adie so he can review how she is tolerating this medication and also so  he can order  restaging scans.  We did review that she will stop taking anastrozole and I sent a message to Dr. Lindi Adie about this.  All questions were answered. The patient knows to call the clinic with any problems, questions or concerns. We can certainly see the patient much sooner if necessary.  Total encounter time:30 minutes*in face-to-face visit time, chart review, lab review, care coordination, order entry, and documentation of the encounter time.  Wilber Bihari, NP 02/18/22 12:02 PM Medical Oncology and Hematology [facility] [place], [place] [address] Tel. [phone]    Fax. [phone]  *Total Encounter Time as defined by the Centers for Medicare and Medicaid Services includes, in addition to the face-to-face time of a patient visit (documented in the note above) non-face-to-face time: obtaining and reviewing outside history, ordering and reviewing medications, tests or procedures, care coordination (communications with other health care professionals or caregivers) and documentation in the medical record.

## 2022-02-18 NOTE — Progress Notes (Signed)
Pt brought in paperwork from [employer] during [place]. Paperwork given to [employer], CPhT who stated she would take over paperwork completion.

## 2022-02-19 ENCOUNTER — Other Ambulatory Visit (HOSPITAL_COMMUNITY): Payer: Self-pay

## 2022-02-19 LAB — CANCER ANTIGEN 27.29: CA 27.29: 165.1 U/mL — ABNORMAL HIGH (ref 0.0–38.6)

## 2022-03-16 ENCOUNTER — Other Ambulatory Visit (HOSPITAL_COMMUNITY): Payer: Self-pay

## 2022-03-18 ENCOUNTER — Encounter: Payer: Self-pay | Admitting: Hematology and Oncology

## 2022-03-18 ENCOUNTER — Inpatient Hospital Stay: Payer: Medicare Other | Attending: Hematology and Oncology

## 2022-03-18 ENCOUNTER — Other Ambulatory Visit: Payer: Self-pay

## 2022-03-18 VITALS — BP 147/72 | HR 89 | Temp 97.9°F | Resp 16

## 2022-03-18 DIAGNOSIS — Z79899 Other long term (current) drug therapy: Secondary | ICD-10-CM | POA: Insufficient documentation

## 2022-03-18 DIAGNOSIS — F17210 Nicotine dependence, cigarettes, uncomplicated: Secondary | ICD-10-CM | POA: Diagnosis not present

## 2022-03-18 DIAGNOSIS — Z5111 Encounter for antineoplastic chemotherapy: Secondary | ICD-10-CM | POA: Diagnosis not present

## 2022-03-18 DIAGNOSIS — C50411 Malignant neoplasm of upper-outer quadrant of right female breast: Secondary | ICD-10-CM | POA: Insufficient documentation

## 2022-03-18 DIAGNOSIS — C778 Secondary and unspecified malignant neoplasm of lymph nodes of multiple regions: Secondary | ICD-10-CM | POA: Insufficient documentation

## 2022-03-18 DIAGNOSIS — Z170 Estrogen receptor positive status [ER+]: Secondary | ICD-10-CM | POA: Diagnosis not present

## 2022-03-18 MED ORDER — FULVESTRANT 250 MG/5ML IM SOSY
500.0000 mg | PREFILLED_SYRINGE | Freq: Once | INTRAMUSCULAR | Status: AC
  Administered 2022-03-18: 500 mg via INTRAMUSCULAR
  Filled 2022-03-18: qty 10

## 2022-03-19 ENCOUNTER — Other Ambulatory Visit (HOSPITAL_COMMUNITY): Payer: Self-pay

## 2022-03-22 ENCOUNTER — Other Ambulatory Visit: Payer: Self-pay

## 2022-03-23 ENCOUNTER — Encounter: Payer: Self-pay | Admitting: Hematology and Oncology

## 2022-03-23 ENCOUNTER — Other Ambulatory Visit (HOSPITAL_COMMUNITY): Payer: Self-pay

## 2022-04-07 NOTE — Progress Notes (Signed)
Patient Care Team: Maris Berger, MD as PCP - General (Family Medicine)  DIAGNOSIS:  Encounter Diagnoses  Name Primary?   Malignant neoplasm of upper-outer quadrant of right breast in female, estrogen receptor positive Yes   Nausea and vomiting in adult     SUMMARY OF ONCOLOGIC HISTORY: Oncology History  Malignant neoplasm of upper-outer quadrant of right breast in female, estrogen receptor positive  07/13/2019 Initial Diagnosis   History of right breast cancer on 2017. Chest CT on 06/06/19 showed a mass in the right axilla and a mass in the right lower lung lobe. PET scan on 07/03/19 showed a right lower lobe pulmonary nodule concerning for malignancy, the right axillary mass consistent with local breast cancer recurrence, and small hypermetabolic right axillary and neck nodes consistent with metastatic adenopathy. Biopsy of the right axilla on 07/13/19 showed carcinoma consistent with breast carcinoma, HER-2 negative (1+), ER+ 95%, PR+ 75%, Ki67 25%.   08/14/2019 -  Anti-estrogen oral therapy   Initially anastrozole alone,   Verzenio October 2022 (Verzenio discontinued for diarrhea), switched to [facility] 12/08/2020     CHIEF COMPLIANT: Follow-up on Faslodex and Anastrozole and Ibrance  INTERVAL HISTORY: Audrey [name] is a 82 year old above-mentioned history of metastatic breast cancer on anastrozole. She presents to the clinic today for a follow-up. She reports that she is doing well on the [place]. She complains of no energy.  She has no other complaints or side effects to report to the clinic today.   ALLERGIES:  is allergic to ativan [lorazepam], cefuroxime, aspirin, codeine, and penicillins.  MEDICATIONS:  Current Outpatient Medications  Medication Sig Dispense Refill   albuterol (VENTOLIN HFA) 108 (90 Base) MCG/ACT inhaler 2 inhalations every 4-6 hours as needed for cough wheezing and shortness of breath     amLODipine (NORVASC) 2.5 MG tablet Take 2.5 mg by mouth daily.      cholecalciferol (VITAMIN D3) 25 MCG (1000 UNIT) tablet Take 1,000 Units by mouth daily.     cyclobenzaprine (FLEXERIL) 5 MG tablet Take 1 tablet (5 mg total) by mouth 3 (three) times daily as needed for muscle spasms. 30 tablet 3   dicyclomine (BENTYL) 20 MG tablet Take 20 mg by mouth 3 (three) times daily as needed for spasms.     hyoscyamine (LEVBID) 0.375 MG 12 hr tablet Take 0.375 mg by mouth every 12 (twelve) hours as needed for cramping.     ketotifen (ZADITOR) 0.025 % ophthalmic solution Place 1 drop into both eyes 2 (two) times daily as needed (dry eyes).     Multiple Vitamin (MULTIVITAMIN) capsule Take 1 capsule by mouth daily.     omeprazole (PRILOSEC) 20 MG capsule Take 20 mg by mouth daily as needed (acid reflux).     ondansetron (ZOFRAN) 4 MG tablet Take 4 mg by mouth every 8 (eight) hours as needed for nausea or vomiting.     palbociclib (IBRANCE) 75 MG tablet Take 1 tablet (75 mg total) by mouth daily. Take for 21 days on, 7 days off, repeat every 28 days. 21 tablet 3   polyethylene glycol (MIRALAX / GLYCOLAX) 17 g packet Take 17 g by mouth daily as needed for moderate constipation.     Zoster Vaccine Adjuvanted [facility]) injection Inject 0.5 mLs into the muscle.     No current facility-administered medications for this visit.    PHYSICAL EXAMINATION: ECOG PERFORMANCE STATUS: 1 - Symptomatic but completely ambulatory  Vitals:   04/16/22 1126  BP: (!) 146/59  Pulse: (!) 102  Resp: 18  Temp: 97.7 F (36.5 C)  SpO2: 100%   Filed Weights   04/16/22 1126  Weight: 133 lb 1.6 oz (60.4 kg)      LABORATORY DATA:  I have reviewed the data as listed    Latest Ref Rng & Units 04/16/2022   11:13 AM 02/18/2022   10:34 AM 12/29/2021    8:40 AM  CMP  Glucose 70 - 99 mg/dL 960101  78  91   BUN 8 - 23 mg/dL 25  21  22    Creatinine 0.44 - 1.00 mg/dL 4.541.46  0.981.18  1.191.25   Sodium 135 - 145 mmol/L 141  140  138   Potassium 3.5 - 5.1 mmol/L 3.8  4.2  4.2   Chloride 98 - 111 mmol/L 108   106  107   CO2 22 - 32 mmol/L 26  27  21    Calcium 8.9 - 10.3 mg/dL 9.8  9.9  9.4   Total Protein 6.5 - 8.1 g/dL 7.2  6.6  7.0   Total Bilirubin 0.3 - 1.2 mg/dL 0.4  0.4  0.5   Alkaline Phos 38 - 126 U/L 81  83  73   AST 15 - 41 U/L 13  14  17    ALT 0 - 44 U/L 9  10  12      Lab Results  Component Value Date   WBC 4.8 04/16/2022   HGB 11.1 (L) 04/16/2022   HCT 32.3 (L) 04/16/2022   MCV 101.6 (H) 04/16/2022   PLT 218 04/16/2022   NEUTROABS PENDING 04/16/2022    ASSESSMENT & PLAN:  Malignant neoplasm of upper-outer quadrant of right breast in female, estrogen receptor positive (HCC) H/O right breast cancer on 2017 and 1999: Lumpectomy.  (Bilateral mastectomies in 2017: Seen by Dr. Rennis [facility]equincy [facility], could not tolerate antiestrogen therapy) Chest CT on 06/06/19 showed a mass in the right axilla and a mass in the right lower lung lobe. PET scan on 07/03/19 showed a right lower lobe pulmonary nodule concerning for malignancy, the right axillary mass consistent with local breast cancer recurrence, and small hypermetabolic right axillary and neck nodes consistent with metastatic adenopathy. Biopsy of the right axilla on 07/13/19 showed carcinoma consistent with breast carcinoma, HER-2 negative (1+), ER+ 95%, PR+ 75%, Ki67 25%. ---------------------------------------------------------------------------------------------------------------------------------------------- Current treatment: Anastrozole 1 mg daily started 08/14/2019 with Ilda FoilIbrance (Verzinio was discontinued because of diarrhea) Ibrance toxicities:   1.  Diverticulitis: Intermittent diarrhea 2. no evidence of leukopenia. 3.  Fatigue: She would like to take a lower dosage of Ibrance.  We reduced the dosage to 75 mg 3 weeks on 1 week off starting 05/25/2021.   Increasing the CA 27-29 levels:  Bone scan 01/08/2022: Multiple sites of abnormal uptake with bone metastases (bilateral femoral and bilateral tibia) CT CAP 01/11/2022: New masslike right  adrenal gland nodule 2.7 cm concerning for metastatic disease.  Interval enlargement of pulmonary nodule 1.3 cm was previously 7 mm   Current treatment: Faslodex with Ibrance started 01/20/2022   Faslodex toxicities: Tolerating it extremely well   Recommend CT CAP and follow-up in 1 month    Orders Placed This Encounter  Procedures   NM Bone Scan Whole Body    Standing Status:   Future    Standing Expiration Date:   04/16/2023    Order Specific Question:   If indicated for the ordered procedure, I authorize the administration of a radiopharmaceutical per Radiology protocol    Answer:   Yes    Order  Specific Question:   Preferred imaging location?    Answer:   [facility] [place]   The patient has a good understanding of the overall plan. she agrees with it. she will call with any problems that may develop before the next visit here. Total time spent: 30 mins including face to face time and time spent for planning, charting and co-ordination of care   Tamsen Meek, MD 04/16/22    I Janan Ridge am acting as a Neurosurgeon for [employer]  I have reviewed the above documentation for accuracy and completeness, and I agree with the above.

## 2022-04-13 ENCOUNTER — Encounter: Payer: Self-pay | Admitting: Hematology and Oncology

## 2022-04-14 ENCOUNTER — Other Ambulatory Visit (HOSPITAL_COMMUNITY): Payer: Self-pay

## 2022-04-16 ENCOUNTER — Other Ambulatory Visit: Payer: Self-pay

## 2022-04-16 ENCOUNTER — Inpatient Hospital Stay: Payer: Medicare Other | Admitting: Hematology and Oncology

## 2022-04-16 ENCOUNTER — Other Ambulatory Visit (HOSPITAL_COMMUNITY): Payer: Self-pay

## 2022-04-16 ENCOUNTER — Inpatient Hospital Stay: Payer: Medicare Other

## 2022-04-16 ENCOUNTER — Inpatient Hospital Stay: Payer: Medicare Other | Attending: Hematology and Oncology

## 2022-04-16 VITALS — BP 146/59 | HR 102 | Temp 97.7°F | Resp 18 | Ht 62.5 in | Wt 133.1 lb

## 2022-04-16 DIAGNOSIS — C50919 Malignant neoplasm of unspecified site of unspecified female breast: Secondary | ICD-10-CM

## 2022-04-16 DIAGNOSIS — Z881 Allergy status to other antibiotic agents status: Secondary | ICD-10-CM | POA: Diagnosis not present

## 2022-04-16 DIAGNOSIS — Z886 Allergy status to analgesic agent status: Secondary | ICD-10-CM | POA: Diagnosis not present

## 2022-04-16 DIAGNOSIS — R5383 Other fatigue: Secondary | ICD-10-CM | POA: Insufficient documentation

## 2022-04-16 DIAGNOSIS — Z5111 Encounter for antineoplastic chemotherapy: Secondary | ICD-10-CM | POA: Insufficient documentation

## 2022-04-16 DIAGNOSIS — K5792 Diverticulitis of intestine, part unspecified, without perforation or abscess without bleeding: Secondary | ICD-10-CM | POA: Insufficient documentation

## 2022-04-16 DIAGNOSIS — Z170 Estrogen receptor positive status [ER+]: Secondary | ICD-10-CM

## 2022-04-16 DIAGNOSIS — C50411 Malignant neoplasm of upper-outer quadrant of right female breast: Secondary | ICD-10-CM

## 2022-04-16 DIAGNOSIS — Z885 Allergy status to narcotic agent status: Secondary | ICD-10-CM | POA: Diagnosis not present

## 2022-04-16 DIAGNOSIS — Z79899 Other long term (current) drug therapy: Secondary | ICD-10-CM | POA: Insufficient documentation

## 2022-04-16 DIAGNOSIS — Z880 Allergy status to penicillin: Secondary | ICD-10-CM | POA: Insufficient documentation

## 2022-04-16 DIAGNOSIS — R112 Nausea with vomiting, unspecified: Secondary | ICD-10-CM

## 2022-04-16 DIAGNOSIS — C799 Secondary malignant neoplasm of unspecified site: Secondary | ICD-10-CM

## 2022-04-16 DIAGNOSIS — C778 Secondary and unspecified malignant neoplasm of lymph nodes of multiple regions: Secondary | ICD-10-CM | POA: Insufficient documentation

## 2022-04-16 DIAGNOSIS — R911 Solitary pulmonary nodule: Secondary | ICD-10-CM | POA: Insufficient documentation

## 2022-04-16 LAB — CMP (CANCER CENTER ONLY)
ALT: 9 U/L (ref 0–44)
AST: 13 U/L — ABNORMAL LOW (ref 15–41)
Albumin: 4.5 g/dL (ref 3.5–5.0)
Alkaline Phosphatase: 81 U/L (ref 38–126)
Anion gap: 7 (ref 5–15)
BUN: 25 mg/dL — ABNORMAL HIGH (ref 8–23)
CO2: 26 mmol/L (ref 22–32)
Calcium: 9.8 mg/dL (ref 8.9–10.3)
Chloride: 108 mmol/L (ref 98–111)
Creatinine: 1.46 mg/dL — ABNORMAL HIGH (ref 0.44–1.00)
GFR, Estimated: 36 mL/min — ABNORMAL LOW (ref >60)
Glucose, Bld: 101 mg/dL — ABNORMAL HIGH (ref 70–99)
Potassium: 3.8 mmol/L (ref 3.5–5.1)
Sodium: 141 mmol/L (ref 135–145)
Total Bilirubin: 0.4 mg/dL (ref 0.3–1.2)
Total Protein: 7.2 g/dL (ref 6.5–8.1)

## 2022-04-16 LAB — CBC WITH DIFFERENTIAL (CANCER CENTER ONLY)
Abs Immature Granulocytes: 0.01 10*3/uL (ref 0.00–0.07)
Basophils Absolute: 0.1 10*3/uL (ref 0.0–0.1)
Basophils Relative: 1 %
Eosinophils Absolute: 0 10*3/uL (ref 0.0–0.5)
Eosinophils Relative: 1 %
HCT: 32.3 % — ABNORMAL LOW (ref 36.0–46.0)
Hemoglobin: 11.1 g/dL — ABNORMAL LOW (ref 12.0–15.0)
Immature Granulocytes: 0 %
Lymphocytes Relative: 49 %
Lymphs Abs: 2.3 10*3/uL (ref 0.7–4.0)
MCH: 34.9 pg — ABNORMAL HIGH (ref 26.0–34.0)
MCHC: 34.4 g/dL (ref 30.0–36.0)
MCV: 101.6 fL — ABNORMAL HIGH (ref 80.0–100.0)
Monocytes Absolute: 0.2 10*3/uL (ref 0.1–1.0)
Monocytes Relative: 4 %
Neutro Abs: 2.2 10*3/uL (ref 1.7–7.7)
Neutrophils Relative %: 45 %
Platelet Count: 218 10*3/uL (ref 150–400)
RBC: 3.18 MIL/uL — ABNORMAL LOW (ref 3.87–5.11)
RDW: 14 % (ref 11.5–15.5)
Smear Review: NORMAL
WBC Count: 4.8 10*3/uL (ref 4.0–10.5)
nRBC: 0 % (ref 0.0–0.2)

## 2022-04-16 MED ORDER — FULVESTRANT 250 MG/5ML IM SOSY
500.0000 mg | PREFILLED_SYRINGE | Freq: Once | INTRAMUSCULAR | Status: AC
  Administered 2022-04-16: 500 mg via INTRAMUSCULAR
  Filled 2022-04-16: qty 10

## 2022-04-16 MED ORDER — CYCLOBENZAPRINE HCL 5 MG PO TABS: 5.0000 mg | ORAL_TABLET | Freq: Three times a day (TID) | ORAL | 3 refills | Status: DC | PRN

## 2022-04-16 NOTE — Assessment & Plan Note (Signed)
H/O right breast cancer on 2017 and 1999: Lumpectomy.  (Bilateral mastectomies in 2017: Seen by Dr. Rennis Harding, could not tolerate antiestrogen therapy) Chest CT on 06/06/19 showed a mass in the right axilla and a mass in the right lower lung lobe. PET scan on 07/03/19 showed a right lower lobe pulmonary nodule concerning for malignancy, the right axillary mass consistent with local breast cancer recurrence, and small hypermetabolic right axillary and neck nodes consistent with metastatic adenopathy. Biopsy of the right axilla on 07/13/19 showed carcinoma consistent with breast carcinoma, HER-2 negative (1+), ER+ 95%, PR+ 75%, Ki67 25%. ---------------------------------------------------------------------------------------------------------------------------------------------- Current treatment: Anastrozole 1 mg daily started 08/14/2019 with [name] (Verzinio was discontinued because of diarrhea) Ibrance toxicities:   1.  Diverticulitis: Intermittent diarrhea 2. no evidence of leukopenia. 3.  Fatigue: She would like to take a lower dosage of Ibrance.  We reduced the dosage to 75 mg 3 weeks on 1 week off starting 05/25/2021.   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  Interval enlargement of pulmonary nodule 1.3 cm was previously 7 mm   Current treatment: Faslodex with Ibrance started 01/20/2022   Faslodex toxicities: Tolerating it extremely well   Recommend CT CAP and follow-up in 1 month

## 2022-04-16 NOTE — Patient Instructions (Signed)

## 2022-04-17 LAB — CANCER ANTIGEN 27.29: CA 27.29: 170.9 U/mL — ABNORMAL HIGH (ref 0.0–38.6)

## 2022-04-19 ENCOUNTER — Other Ambulatory Visit: Payer: Self-pay | Admitting: Hematology and Oncology

## 2022-04-19 ENCOUNTER — Other Ambulatory Visit (HOSPITAL_COMMUNITY): Payer: Self-pay

## 2022-04-19 ENCOUNTER — Other Ambulatory Visit: Payer: Self-pay

## 2022-04-19 DIAGNOSIS — C50411 Malignant neoplasm of upper-outer quadrant of right female breast: Secondary | ICD-10-CM

## 2022-04-19 MED ORDER — PALBOCICLIB 75 MG PO TABS
75.0000 mg | ORAL_TABLET | Freq: Every day | ORAL | 3 refills | Status: DC
Start: 2022-04-19 — End: 2022-08-03
  Filled 2022-04-19: qty 21, 21d supply, fill #0
  Filled 2022-05-10: qty 21, 28d supply, fill #1
  Filled 2022-06-10: qty 21, 28d supply, fill #2
  Filled 2022-07-06: qty 21, 28d supply, fill #3

## 2022-04-20 ENCOUNTER — Other Ambulatory Visit (HOSPITAL_COMMUNITY): Payer: Self-pay

## 2022-05-10 ENCOUNTER — Other Ambulatory Visit: Payer: Self-pay

## 2022-05-12 ENCOUNTER — Other Ambulatory Visit (HOSPITAL_COMMUNITY): Payer: Medicare Other

## 2022-05-12 ENCOUNTER — Encounter (HOSPITAL_COMMUNITY): Payer: Self-pay

## 2022-05-12 ENCOUNTER — Ambulatory Visit (HOSPITAL_COMMUNITY)
Admission: RE | Admit: 2022-05-12 | Discharge: 2022-05-12 | Disposition: A | Discharge status: Home / Self Care | Payer: Medicare Other | Source: Ambulatory Visit | Attending: Hematology and Oncology | Admitting: Hematology and Oncology

## 2022-05-12 DIAGNOSIS — C50919 Malignant neoplasm of unspecified site of unspecified female breast: Secondary | ICD-10-CM | POA: Diagnosis present

## 2022-05-12 DIAGNOSIS — C50411 Malignant neoplasm of upper-outer quadrant of right female breast: Secondary | ICD-10-CM | POA: Insufficient documentation

## 2022-05-12 DIAGNOSIS — C799 Secondary malignant neoplasm of unspecified site: Secondary | ICD-10-CM | POA: Diagnosis present

## 2022-05-12 DIAGNOSIS — Z170 Estrogen receptor positive status [ER+]: Secondary | ICD-10-CM | POA: Diagnosis present

## 2022-05-12 MED ORDER — IOHEXOL 300 MG/ML  SOLN
80.0000 mL | Freq: Once | INTRAMUSCULAR | Status: AC | PRN
  Administered 2022-05-12: 80 mL via INTRAVENOUS

## 2022-05-12 MED ORDER — SODIUM CHLORIDE (PF) 0.9 % IJ SOLN
INTRAMUSCULAR | Status: AC
  Filled 2022-05-12: qty 50

## 2022-05-14 ENCOUNTER — Inpatient Hospital Stay: Payer: Medicare Other

## 2022-05-14 ENCOUNTER — Inpatient Hospital Stay (HOSPITAL_BASED_OUTPATIENT_CLINIC_OR_DEPARTMENT_OTHER): Payer: Medicare Other | Admitting: Hematology and Oncology

## 2022-05-14 ENCOUNTER — Inpatient Hospital Stay: Payer: Medicare Other | Attending: Hematology and Oncology

## 2022-05-14 ENCOUNTER — Other Ambulatory Visit (HOSPITAL_COMMUNITY): Payer: Self-pay

## 2022-05-14 ENCOUNTER — Other Ambulatory Visit: Payer: Self-pay

## 2022-05-14 VITALS — BP 146/58 | HR 95 | Temp 97.2°F | Resp 18 | Ht 62.5 in | Wt 135.1 lb

## 2022-05-14 DIAGNOSIS — Z9049 Acquired absence of other specified parts of digestive tract: Secondary | ICD-10-CM | POA: Diagnosis not present

## 2022-05-14 DIAGNOSIS — R42 Dizziness and giddiness: Secondary | ICD-10-CM | POA: Insufficient documentation

## 2022-05-14 DIAGNOSIS — Z5111 Encounter for antineoplastic chemotherapy: Secondary | ICD-10-CM | POA: Diagnosis present

## 2022-05-14 DIAGNOSIS — Z880 Allergy status to penicillin: Secondary | ICD-10-CM | POA: Insufficient documentation

## 2022-05-14 DIAGNOSIS — K5792 Diverticulitis of intestine, part unspecified, without perforation or abscess without bleeding: Secondary | ICD-10-CM | POA: Diagnosis not present

## 2022-05-14 DIAGNOSIS — Z170 Estrogen receptor positive status [ER+]: Secondary | ICD-10-CM | POA: Insufficient documentation

## 2022-05-14 DIAGNOSIS — C7951 Secondary malignant neoplasm of bone: Secondary | ICD-10-CM | POA: Insufficient documentation

## 2022-05-14 DIAGNOSIS — Z886 Allergy status to analgesic agent status: Secondary | ICD-10-CM | POA: Diagnosis not present

## 2022-05-14 DIAGNOSIS — Z90710 Acquired absence of both cervix and uterus: Secondary | ICD-10-CM | POA: Insufficient documentation

## 2022-05-14 DIAGNOSIS — C50411 Malignant neoplasm of upper-outer quadrant of right female breast: Secondary | ICD-10-CM

## 2022-05-14 DIAGNOSIS — F17210 Nicotine dependence, cigarettes, uncomplicated: Secondary | ICD-10-CM | POA: Insufficient documentation

## 2022-05-14 DIAGNOSIS — Z8616 Personal history of COVID-19: Secondary | ICD-10-CM | POA: Insufficient documentation

## 2022-05-14 DIAGNOSIS — Z79899 Other long term (current) drug therapy: Secondary | ICD-10-CM | POA: Insufficient documentation

## 2022-05-14 DIAGNOSIS — R682 Dry mouth, unspecified: Secondary | ICD-10-CM | POA: Insufficient documentation

## 2022-05-14 DIAGNOSIS — Z885 Allergy status to narcotic agent status: Secondary | ICD-10-CM | POA: Insufficient documentation

## 2022-05-14 DIAGNOSIS — Z881 Allergy status to other antibiotic agents status: Secondary | ICD-10-CM | POA: Insufficient documentation

## 2022-05-14 DIAGNOSIS — C7801 Secondary malignant neoplasm of right lung: Secondary | ICD-10-CM | POA: Diagnosis not present

## 2022-05-14 DIAGNOSIS — C778 Secondary and unspecified malignant neoplasm of lymph nodes of multiple regions: Secondary | ICD-10-CM | POA: Diagnosis not present

## 2022-05-14 DIAGNOSIS — R5383 Other fatigue: Secondary | ICD-10-CM | POA: Diagnosis not present

## 2022-05-14 LAB — CBC WITH DIFFERENTIAL (CANCER CENTER ONLY)
Abs Immature Granulocytes: 0.01 10*3/uL (ref 0.00–0.07)
Basophils Absolute: 0.1 10*3/uL (ref 0.0–0.1)
Basophils Relative: 1 %
Eosinophils Absolute: 0.1 10*3/uL (ref 0.0–0.5)
Eosinophils Relative: 1 %
HCT: 33.4 % — ABNORMAL LOW (ref 36.0–46.0)
Hemoglobin: 11.5 g/dL — ABNORMAL LOW (ref 12.0–15.0)
Immature Granulocytes: 0 %
Lymphocytes Relative: 47 %
Lymphs Abs: 2.4 10*3/uL (ref 0.7–4.0)
MCH: 35 pg — ABNORMAL HIGH (ref 26.0–34.0)
MCHC: 34.4 g/dL (ref 30.0–36.0)
MCV: 101.5 fL — ABNORMAL HIGH (ref 80.0–100.0)
Monocytes Absolute: 0.2 10*3/uL (ref 0.1–1.0)
Monocytes Relative: 5 %
Neutro Abs: 2.4 10*3/uL (ref 1.7–7.7)
Neutrophils Relative %: 46 %
Platelet Count: 234 10*3/uL (ref 150–400)
RBC: 3.29 MIL/uL — ABNORMAL LOW (ref 3.87–5.11)
RDW: 13.6 % (ref 11.5–15.5)
Smear Review: NORMAL
WBC Count: 5.1 10*3/uL (ref 4.0–10.5)
nRBC: 0 % (ref 0.0–0.2)

## 2022-05-14 LAB — CMP (CANCER CENTER ONLY)
ALT: 10 U/L (ref 0–44)
AST: 14 U/L — ABNORMAL LOW (ref 15–41)
Albumin: 4.6 g/dL (ref 3.5–5.0)
Alkaline Phosphatase: 88 U/L (ref 38–126)
Anion gap: 7 (ref 5–15)
BUN: 19 mg/dL (ref 8–23)
CO2: 27 mmol/L (ref 22–32)
Calcium: 9.8 mg/dL (ref 8.9–10.3)
Chloride: 105 mmol/L (ref 98–111)
Creatinine: 1.33 mg/dL — ABNORMAL HIGH (ref 0.44–1.00)
GFR, Estimated: 41 mL/min — ABNORMAL LOW (ref >60)
Glucose, Bld: 87 mg/dL (ref 70–99)
Potassium: 4.4 mmol/L (ref 3.5–5.1)
Sodium: 139 mmol/L (ref 135–145)
Total Bilirubin: 0.5 mg/dL (ref 0.3–1.2)
Total Protein: 7.2 g/dL (ref 6.5–8.1)

## 2022-05-14 MED ORDER — FULVESTRANT 250 MG/5ML IM SOSY
500.0000 mg | PREFILLED_SYRINGE | Freq: Once | INTRAMUSCULAR | Status: AC
  Administered 2022-05-14: 500 mg via INTRAMUSCULAR
  Filled 2022-05-14: qty 10

## 2022-05-14 MED ORDER — MECLIZINE HCL 25 MG PO TABS
25.0000 mg | ORAL_TABLET | Freq: Three times a day (TID) | ORAL | 0 refills | Status: DC | PRN
  Filled 2022-05-14: qty 30, 10d supply, fill #0

## 2022-05-14 NOTE — Assessment & Plan Note (Signed)
H/O right breast cancer on 2017 and 1999: Lumpectomy.  (Bilateral mastectomies in 2017: Seen by Dr. Rennis Harding, could not tolerate antiestrogen therapy) Chest CT on 06/06/19 showed a mass in the right axilla and a mass in the right lower lung lobe. PET scan on 07/03/19 showed a right lower lobe pulmonary nodule concerning for malignancy, the right axillary mass consistent with local breast cancer recurrence, and small hypermetabolic right axillary and neck nodes consistent with metastatic adenopathy. Biopsy of the right axilla on 07/13/19 showed carcinoma consistent with breast carcinoma, HER-2 negative (1+), ER+ 95%, PR+ 75%, Ki67 25%. ---------------------------------------------------------------------------------------------------------------------------------------------- Current treatment: Anastrozole 1 mg daily started 08/14/2019 with Audrey [name] (Verzinio was discontinued because of diarrhea) Ibrance toxicities:   1.  Diverticulitis: Intermittent diarrhea 2. no evidence of leukopenia. 3.  Fatigue: She would like to take a lower dosage of Ibrance.  We reduced the dosage to 75 mg 3 weeks on 1 week off starting 05/25/2021.   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  Interval enlargement of pulmonary nodule 1.3 cm was previously 7 mm   Current treatment: Faslodex with Ibrance started 01/20/2022   Faslodex toxicities: Tolerating it extremely well    CT CAP 05/12/2022:

## 2022-05-14 NOTE — Progress Notes (Signed)
Patient Care Team: Maris Berger, MD as PCP - General (Family Medicine)  DIAGNOSIS:  Encounter Diagnosis  Name Primary?   Malignant neoplasm of upper-outer quadrant of right breast in female, estrogen receptor positive (HCC) Yes    SUMMARY OF ONCOLOGIC HISTORY: Oncology History  Malignant neoplasm of upper-outer quadrant of right breast in female, estrogen receptor positive (HCC)  07/13/2019 Initial Diagnosis   History of right breast cancer on 2017. Chest CT on 06/06/19 showed a mass in the right axilla and a mass in the right lower lung lobe. PET scan on 07/03/19 showed a right lower lobe pulmonary nodule concerning for malignancy, the right axillary mass consistent with local breast cancer recurrence, and small hypermetabolic right axillary and neck nodes consistent with metastatic adenopathy. Biopsy of the right axilla on 07/13/19 showed carcinoma consistent with breast carcinoma, HER-2 negative (1+), ER+ 95%, PR+ 75%, Ki67 25%.   08/14/2019 -  Anti-estrogen oral therapy   Initially anastrozole alone,   Verzenio October 2022 (Verzenio discontinued for diarrhea), switched to [facility] 12/08/2020     CHIEF COMPLIANT: Follow-up on Faslodex and Anastrozole and Ibrance/ scans    INTERVAL HISTORY: Audrey [name] is a 82 year old above-mentioned history of metastatic breast cancer on anastrozole. She presents to the clinic today for a follow-up. She reports that she is having vertigo today. She says it doesn't bother her when she sits. It is only when she is moving around. She is tolerating the Ibrance. She sill feeling tired the same as before.  ALLERGIES:  is allergic to ativan [lorazepam], cefuroxime, aspirin, codeine, cyclosporine modified, and penicillins.  MEDICATIONS:  Current Outpatient Medications  Medication Sig Dispense Refill   albuterol (VENTOLIN HFA) 108 (90 Base) MCG/ACT inhaler 2 inhalations every 4-6 hours as needed for cough wheezing and shortness of breath      amLODipine (NORVASC) 2.5 MG tablet Take 2.5 mg by mouth daily.     cholecalciferol (VITAMIN D3) 25 MCG (1000 UNIT) tablet Take 1,000 Units by mouth daily.     cyclobenzaprine (FLEXERIL) 5 MG tablet Take 1 tablet (5 mg total) by mouth 3 (three) times daily as needed for muscle spasms. 30 tablet 3   dicyclomine (BENTYL) 20 MG tablet Take 20 mg by mouth 3 (three) times daily as needed for spasms.     hyoscyamine (LEVBID) 0.375 MG 12 hr tablet Take 0.375 mg by mouth every 12 (twelve) hours as needed for cramping.     ketotifen (ZADITOR) 0.025 % ophthalmic solution Place 1 drop into both eyes 2 (two) times daily as needed (dry eyes).     meclizine (ANTIVERT) 25 MG tablet Take 1 tablet (25 mg total) by mouth 3 (three) times daily as needed for dizziness. 30 tablet 0   Multiple Vitamin (MULTIVITAMIN) capsule Take 1 capsule by mouth daily.     omeprazole (PRILOSEC) 20 MG capsule Take 20 mg by mouth daily as needed (acid reflux).     ondansetron (ZOFRAN) 4 MG tablet Take 4 mg by mouth every 8 (eight) hours as needed for nausea or vomiting.     palbociclib (IBRANCE) 75 MG tablet Take 1 tablet (75 mg total) by mouth daily. Take for 21 days on, 7 days off, repeat every 28 days. 21 tablet 3   polyethylene glycol (MIRALAX / GLYCOLAX) 17 g packet Take 17 g by mouth daily as needed for moderate constipation.     Zoster Vaccine Adjuvanted [facility]) injection Inject 0.5 mLs into the muscle.     No current facility-administered medications  for this visit.    PHYSICAL EXAMINATION: ECOG PERFORMANCE STATUS: 1 - Symptomatic but completely ambulatory  Vitals:   05/14/22 0857  BP: (!) 146/58  Pulse: 95  Resp: 18  Temp: (!) 97.2 F (36.2 C)  SpO2: 98%   Filed Weights   05/14/22 0857  Weight: 135 lb 1.6 oz (61.3 kg)      LABORATORY DATA:  I have reviewed the data as listed    Latest Ref Rng & Units 04/16/2022   11:13 AM 02/18/2022   10:34 AM 12/29/2021    8:40 AM  CMP  Glucose 70 - 99 mg/dL 161  78  91    BUN 8 - 23 mg/dL 25  21  22    Creatinine 0.44 - 1.00 mg/dL 0.96  0.45  4.09   Sodium 135 - 145 mmol/L 141  140  138   Potassium 3.5 - 5.1 mmol/L 3.8  4.2  4.2   Chloride 98 - 111 mmol/L 108  106  107   CO2 22 - 32 mmol/L 26  27  21    Calcium 8.9 - 10.3 mg/dL 9.8  9.9  9.4   Total Protein 6.5 - 8.1 g/dL 7.2  6.6  7.0   Total Bilirubin 0.3 - 1.2 mg/dL 0.4  0.4  0.5   Alkaline Phos 38 - 126 U/L 81  83  73   AST 15 - 41 U/L 13  14  17    ALT 0 - 44 U/L 9  10  12      Lab Results  Component Value Date   WBC 5.1 05/14/2022   HGB 11.5 (L) 05/14/2022   HCT 33.4 (L) 05/14/2022   MCV 101.5 (H) 05/14/2022   PLT 234 05/14/2022   NEUTROABS PENDING 05/14/2022    ASSESSMENT & PLAN:  Malignant neoplasm of upper-outer quadrant of right breast in female, estrogen receptor positive (HCC) H/O right breast cancer on 2017 and 1999: Lumpectomy.  (Bilateral mastectomies in 2017: Seen by Dr. Rennis Harding, could not tolerate antiestrogen therapy) Chest CT on 06/06/19 showed a mass in the right axilla and a mass in the right lower lung lobe. PET scan on 07/03/19 showed a right lower lobe pulmonary nodule concerning for malignancy, the right axillary mass consistent with local breast cancer recurrence, and small hypermetabolic right axillary and neck nodes consistent with metastatic adenopathy. Biopsy of the right axilla on 07/13/19 showed carcinoma consistent with breast carcinoma, HER-2 negative (1+), ER+ 95%, PR+ 75%, Ki67 25%. ---------------------------------------------------------------------------------------------------------------------------------------------- Current treatment: Anastrozole 1 mg daily started 08/14/2019 with [name] (Verzinio was discontinued because of diarrhea) Ibrance toxicities:   1.  Diverticulitis: Intermittent diarrhea 2. no evidence of leukopenia. 3.  Fatigue: She would like to take a lower dosage of Ibrance.  We reduced the dosage to 75 mg 3 weeks on 1 week off starting  05/25/2021.   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  Interval enlargement of pulmonary nodule 1.3 cm was previously 7 mm   Current treatment: Faslodex with Ibrance started 01/20/2022   Faslodex toxicities: Tolerating it extremely well    CT CAP 05/12/2022: To my review the lung nodule appears to be the same and we are waiting for the official reading of the radiology report to make any decisions. In the meantime we will continue her monthly Faslodex injections and she will  continue with Ibrance at the current dosage.  I will see her back in 3 months with labs and follow-up and scans can be done every 6 months.   No orders of the defined types were placed in this encounter.  The patient has a good understanding of the overall plan. she agrees with it. she will call with any problems that may develop before the next visit here. Total time spent: 30 mins including face to face time and time spent for planning, charting and co-ordination of care   Tamsen Meek, MD 05/14/22    I Janan Ridge am acting as a Neurosurgeon for [employer]  I have reviewed the above documentation for accuracy and completeness, and I agree with the above.

## 2022-05-14 NOTE — Patient Instructions (Signed)

## 2022-05-15 LAB — CANCER ANTIGEN 27.29: CA 27.29: 180.6 U/mL — ABNORMAL HIGH (ref 0.0–38.6)

## 2022-05-17 ENCOUNTER — Telehealth: Payer: Self-pay

## 2022-05-17 ENCOUNTER — Other Ambulatory Visit: Payer: Self-pay

## 2022-05-17 NOTE — Telephone Encounter (Signed)
Pt called for CT results. Routed to NP for her to further eval. Advised pt we would be in touch with results.

## 2022-05-18 ENCOUNTER — Telehealth: Payer: Self-pay

## 2022-05-18 NOTE — Telephone Encounter (Signed)
-----   Message from Loa Socks, NP sent at 05/17/2022  4:31 PM EDT ----- Scans look good. Lymph nodes are better and lung nodule is the same.  VG can go into further detail with her if needed.   ----- Message ----- From: De Blanch, LPN Sent: 05/16/2128   4:19 PM EDT To: Loa Socks, NP  This appears to be stable, but can you just check over me? The pt is asking for results.

## 2022-05-18 NOTE — Telephone Encounter (Signed)
-----   Message from Lindsey Cornetto Causey, NP sent at 05/17/2022  4:31 PM EDT ----- Scans look good. Lymph nodes are better and lung nodule is the same.  VG can go into further detail with her if needed.   ----- Message ----- From: Brown, Teniya Filter G, LPN Sent: 05/17/2022   4:19 PM EDT To: Lindsey Cornetto Causey, NP  This appears to be stable, but can you just check over me? The pt is asking for results.   

## 2022-05-18 NOTE — Telephone Encounter (Signed)
Pt returned call. She was given results and verbalized understanding.

## 2022-05-18 NOTE — Telephone Encounter (Signed)
Attempted to call pt. LVM for call back  

## 2022-05-24 ENCOUNTER — Telehealth: Payer: Self-pay

## 2022-05-24 NOTE — Telephone Encounter (Signed)
Returned Pt's call regarding symptoms post faslodex inj. Pt states she had faslodex inj on 05/14/22. Pt started having left leg pain, chills without fever, and headaches 4 days later. Pt states that sx have resolved and wanted [place] to be aware as her next inj appt is 06/11/22. Discussed with Pt that sx might not be related to faslodex as she has been receiving since January without previous issue. Pt denies cough, congestion, fever, fatigue, and weakness. Offered NP appts this week to discuss, Pt declined stating that sx have resolved. Made NP appt prior to next faslodex inj on 06/11/22 to discuss further. Altered lab and inj appts to fit NP schedule. Advised Pt to call back if sx return. Pt agreed and verbalized understanding.

## 2022-06-10 ENCOUNTER — Other Ambulatory Visit (HOSPITAL_COMMUNITY): Payer: Self-pay

## 2022-06-11 ENCOUNTER — Inpatient Hospital Stay (HOSPITAL_BASED_OUTPATIENT_CLINIC_OR_DEPARTMENT_OTHER): Payer: Medicare Other

## 2022-06-11 ENCOUNTER — Inpatient Hospital Stay: Payer: Medicare Other

## 2022-06-11 ENCOUNTER — Encounter: Payer: Self-pay | Admitting: Adult Health

## 2022-06-11 ENCOUNTER — Inpatient Hospital Stay: Payer: Medicare Other | Admitting: Adult Health

## 2022-06-11 VITALS — BP 153/65 | HR 94 | Temp 97.8°F | Resp 16 | Ht 62.0 in | Wt 135.0 lb

## 2022-06-11 DIAGNOSIS — C50411 Malignant neoplasm of upper-outer quadrant of right female breast: Secondary | ICD-10-CM | POA: Diagnosis not present

## 2022-06-11 DIAGNOSIS — Z170 Estrogen receptor positive status [ER+]: Secondary | ICD-10-CM

## 2022-06-11 DIAGNOSIS — Z5111 Encounter for antineoplastic chemotherapy: Secondary | ICD-10-CM | POA: Diagnosis not present

## 2022-06-11 MED ORDER — FULVESTRANT 250 MG/5ML IM SOSY
500.0000 mg | PREFILLED_SYRINGE | Freq: Once | INTRAMUSCULAR | Status: AC
  Administered 2022-06-11: 500 mg via INTRAMUSCULAR
  Filled 2022-06-11: qty 10

## 2022-06-11 NOTE — Assessment & Plan Note (Signed)
H/O right breast cancer on 2017 and 1999: Lumpectomy.  (Bilateral mastectomies in 2017: Seen by Dr. Rennis Harding, could not tolerate antiestrogen therapy) Chest CT on 06/06/19 showed a mass in the right axilla and a mass in the right lower lung lobe. PET scan on 07/03/19 showed a right lower lobe pulmonary nodule concerning for malignancy, the right axillary mass consistent with local breast cancer recurrence, and small hypermetabolic right axillary and neck nodes consistent with metastatic adenopathy. Biopsy of the right axilla on 07/13/19 showed carcinoma consistent with breast carcinoma, HER-2 negative (1+), ER+ 95%, PR+ 75%, Ki67 25%. 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  Interval enlargement of pulmonary nodule 1.3 cm was previously 7 mm CT CAP 05/12/2022: No new progression.    Current treatment: Faslodex with Ibrance started 01/20/2022   Faslodex toxicities: Tolerating it extremely well   Ibrance toxicities:   1.  Diverticulitis: Intermittent diarrhea 2.  No evidence of leukopenia. 3.  Fatigue: stable, on reduced dose of Ibrance.  I am unsure if the leg pain is related to the Faslodex injection.  It could have been related to a viral syndrome.  I told Audrey [name] that the only way to know is for her to receive the Faslodex again.  We discussed taking Tylenol or Aleve or 1 Tylenol and 1 Aleve BID PRN with food for the pain.  I reviewed with her that her kidney function is elevated and she really needs to work on drinking fluids to  maintain adequate elimination of the medicine she is receiving from her body.  Due to the concern over the leg pain and the issue of difficulty with obtaining lab sticks she will see Dr. Pamelia Hoit in June.  That way he can discuss next steps and options at that time.

## 2022-06-11 NOTE — Progress Notes (Signed)
[facility] Cancer Follow up:    Audrey Berger, MD [address]  [place] [address]   DIAGNOSIS: Stage IV breast cancer  SUMMARY OF ONCOLOGIC HISTORY: Oncology History  Malignant neoplasm of upper-outer quadrant of right breast in female, estrogen receptor positive (HCC)  07/13/2019 Initial Diagnosis   History of right breast cancer on 2017. Chest CT on 06/06/19 showed a mass in the right axilla and a mass in the right lower lung lobe. PET scan on 07/03/19 showed a right lower lobe pulmonary nodule concerning for malignancy, the right axillary mass consistent with local breast cancer recurrence, and small hypermetabolic right axillary and neck nodes consistent with metastatic adenopathy. Biopsy of the right axilla on 07/13/19 showed carcinoma consistent with breast carcinoma, HER-2 negative (1+), ER+ 95%, PR+ 75%, Ki67 25%.   08/14/2019 -  Anti-estrogen oral therapy   Initially anastrozole alone,   Verzenio October 2022 (Verzenio discontinued for diarrhea), switched to [facility] 12/08/2020     CURRENT THERAPY: Faslodex/Ibrance  INTERVAL HISTORY: Audrey [name] 82 y.o. female returns for f/u prior to receiving her next faslodex injection.  She is tolerating her Faslodex and Ibrance moderately well.  Her last dose of [name] is today and then she will have a week off.  Her most recent imaging occurred on 5/1 and was consistent with improvement in mediastinal and hilar adenopathy, stability otherwise, and no signs of progression.    After receiving her Faslodex injection last months she noticed 3 days of increased bilateral leg pain.  At first this was in her right medial knee then it was in her left lower leg and then it was in her left upper leg.  She took Advil which did take the edge off.  This pain resolved after 3 days.  She had no other symptoms during this time and wondered if it was related to the Faslodex.  He tells me that she still feels fatigued and has a dry mouth.  Lab  was unable to obtain a sample to test her blood they stuck her 3 times today.   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  MEDICAL HISTORY: Past Medical History:  Diagnosis Date   Anxiety    Arthritis    "hands" (02/25/2014)   Borderline type 2 diabetes mellitus    Breast cancer, right breast (HCC) 1999   recurrence 08/2017   COVID    March 2021 and early 2022   GERD (gastroesophageal reflux disease)    High cholesterol    "can't take the RX; makes my bones ache" (02/25/2014)   History of hiatal hernia    History of stomach ulcers    Hypertension    Migraines    hx   Pneumonia    PONV (postoperative nausea and vomiting)    at last colonoscopy she was told she was hard to awaken   Shortness of breath dyspnea    w/ exertion     SURGICAL HISTORY: Past Surgical History:  Procedure Laterality Date   APPENDECTOMY     BREAST BIOPSY Right 1999   BREAST LUMPECTOMY Right 1999   BRONCHIAL BIOPSY  12/29/2020   Procedure: BRONCHIAL BIOPSIES;  Surgeon: Leslye Peer, MD;  Location:  [facility] ENDOSCOPY;  Service: Pulmonary;;   BRONCHIAL BRUSHINGS  12/29/2020   Procedure: BRONCHIAL BRUSHINGS;  Surgeon: Leslye Peer, MD;  Location: [facility] ENDOSCOPY;  Service: Pulmonary;;   BRONCHIAL NEEDLE ASPIRATION BIOPSY  12/29/2020   Procedure: BRONCHIAL NEEDLE ASPIRATION BIOPSIES;  Surgeon: Leslye Peer, MD;  Location: [facility] ENDOSCOPY;  Service: Pulmonary;;   BRONCHIAL WASHINGS  12/29/2020   Procedure: BRONCHIAL WASHINGS;  Surgeon: Leslye Peer, MD;  Location: [facility] ENDOSCOPY;  Service: Pulmonary;;   DILATION AND CURETTAGE OF UTERUS     FINE NEEDLE ASPIRATION  12/29/2020    Procedure: FINE NEEDLE ASPIRATION (FNA) LINEAR;  Surgeon: Leslye Peer, MD;  Location: [facility] ENDOSCOPY;  Service: Pulmonary;;   PARTIAL KNEE ARTHROPLASTY Left 02/25/2014   Procedure: LEFT UNICOMPARTMENTAL (medial compartment) KNEE;  Surgeon: Dannielle Huh, MD;  Location: [facility] OR;  Service: Orthopedics;  Laterality: Left;   REPLACEMENT UNICONDYLAR JOINT KNEE Left 02/25/2014   TONSILLECTOMY     VAGINAL HYSTERECTOMY     VIDEO BRONCHOSCOPY WITH ENDOBRONCHIAL ULTRASOUND N/A 12/29/2020   Procedure: VIDEO BRONCHOSCOPY WITH ENDOBRONCHIAL ULTRASOUND;  Surgeon: Leslye Peer, MD;  Location: [facility] ENDOSCOPY;  Service: Pulmonary;  Laterality: N/A;   VIDEO BRONCHOSCOPY WITH RADIAL ENDOBRONCHIAL ULTRASOUND  12/29/2020   Procedure: RADIAL ENDOBRONCHIAL ULTRASOUND;  Surgeon: Leslye Peer, MD;  Location: [facility] ENDOSCOPY;  Service: Pulmonary;;    SOCIAL HISTORY: Social History   Socioeconomic History   Marital status: Widowed    Spouse name: Not on file   Number of children: Not on file   Years of education: Not on file   Highest education level: Not on file  Occupational History   Not on file  Tobacco Use   Smoking status: Some Days    Packs/day: 0.75    Years: 10.00    Additional pack years: 0.00    Total pack years: 7.50    Types: Cigarettes    Start date: 11/05/2002    Last attempt to quit: 02/12/2011    Years since quitting: 11.3   Smokeless tobacco: Never   Tobacco comments:    Smokes 7 - 10 cigarettes a day [facility] 12/18/20  Substance and Sexual Activity   Alcohol use: No   Drug use: No   Sexual activity: Never  Other Topics Concern   Not on file  Social History Narrative   Not on file   Social Determinants of Health   Financial Resource Strain: Not on file  Food Insecurity: Not on file  Transportation Needs: Not on file  Physical Activity: Not on file  Stress: Not on file  Social Connections: Not on file  Intimate Partner Violence: Not on file    FAMILY HISTORY: History reviewed. No  pertinent family history.  Review of Systems  Constitutional:  Positive for fatigue. Negative for appetite change, chills, fever and unexpected weight change.  HENT:   Negative for hearing loss, lump/mass and trouble swallowing.   Eyes:  Negative for eye problems and icterus.  Respiratory:  Negative for chest tightness, cough and shortness of breath.   Cardiovascular:  Negative for chest pain, leg swelling and palpitations.  Gastrointestinal:  Negative for abdominal distention, abdominal pain, constipation, diarrhea, nausea and vomiting.  Endocrine: Negative for hot flashes.  Genitourinary:  Negative for difficulty urinating.   Musculoskeletal:  Negative for arthralgias.  Skin:  Negative for itching and rash.  Neurological:  Negative for dizziness, extremity weakness, headaches and numbness.  Hematological:  Negative for adenopathy. Does not bruise/bleed easily.  Psychiatric/Behavioral:  Negative for  depression. The patient is not nervous/anxious.       PHYSICAL EXAMINATION   Onc Performance Status - 06/11/22 1117       ECOG Perf Status   ECOG Perf Status Restricted in physically strenuous activity but ambulatory and able to carry out work of a light or sedentary nature, e.g., light house work, office work      KPS SCALE   KPS % SCORE Normal activity with effort, some s/s of disease             Vitals:   06/11/22 1116  BP: (!) 153/65  Pulse: 94  Resp: 16  Temp: 97.8 F (36.6 C)  SpO2: 98%    Physical Exam Constitutional:      General: She is not in acute distress.    Appearance: Normal appearance. She is not toxic-appearing.  HENT:     Head: Normocephalic and atraumatic.     Mouth/Throat:     Mouth: Mucous membranes are moist.     Pharynx: Oropharynx is clear. No oropharyngeal exudate or posterior oropharyngeal erythema.  Eyes:     General: No scleral icterus. Cardiovascular:     Rate and Rhythm: Normal rate and regular rhythm.     Pulses: Normal pulses.      Heart sounds: Normal heart sounds.  Pulmonary:     Effort: Pulmonary effort is normal.     Breath sounds: Normal breath sounds.  Abdominal:     General: Abdomen is flat. Bowel sounds are normal. There is no distension.     Palpations: Abdomen is soft.     Tenderness: There is no abdominal tenderness.  Musculoskeletal:        General: No swelling.     Cervical back: Neck supple.  Lymphadenopathy:     Cervical: No cervical adenopathy.  Skin:    General: Skin is warm and dry.     Findings: No rash.  Neurological:     General: No focal deficit present.     Mental Status: She is alert.  Psychiatric:        Mood and Affect: Mood normal.        Behavior: Behavior normal.     LABORATORY DATA:  CBC    Component Value Date/Time   WBC 5.1 05/14/2022 0841   WBC 10.1 07/13/2019 1130   RBC 3.29 (L) 05/14/2022 0841   HGB 11.5 (L) 05/14/2022 0841   HCT 33.4 (L) 05/14/2022 0841   PLT 234 05/14/2022 0841   MCV 101.5 (H) 05/14/2022 0841   MCH 35.0 (H) 05/14/2022 0841   MCHC 34.4 05/14/2022 0841   RDW 13.6 05/14/2022 0841   LYMPHSABS 2.4 05/14/2022 0841   MONOABS 0.2 05/14/2022 0841   EOSABS 0.1 05/14/2022 0841   BASOSABS 0.1 05/14/2022 0841    CMP     Component Value Date/Time   NA 139 05/14/2022 0841   K 4.4 05/14/2022 0841   CL 105 05/14/2022 0841   CO2 27 05/14/2022 0841   GLUCOSE 87 05/14/2022 0841   BUN 19 05/14/2022 0841   CREATININE 1.33 (H) 05/14/2022 0841   CALCIUM 9.8 05/14/2022 0841   PROT 7.2 05/14/2022 0841   ALBUMIN 4.6 05/14/2022 0841   AST 14 (L) 05/14/2022 0841   ALT 10 05/14/2022 0841   ALKPHOS 88 05/14/2022 0841   BILITOT 0.5 05/14/2022 0841   GFRNONAA 41 (L) 05/14/2022 0841   GFRAA >60 07/13/2019 1130      ASSESSMENT and THERAPY PLAN:   Malignant neoplasm of upper-outer  quadrant of right breast in female, estrogen receptor positive (HCC) H/O right breast cancer on 2017 and 1999: Lumpectomy.  (Bilateral mastectomies in 2017: Seen by Dr.  Rennis Harding, could not tolerate antiestrogen therapy) Chest CT on 06/06/19 showed a mass in the right axilla and a mass in the right lower lung lobe. PET scan on 07/03/19 showed a right lower lobe pulmonary nodule concerning for malignancy, the right axillary mass consistent with local breast cancer recurrence, and small hypermetabolic right axillary and neck nodes consistent with metastatic adenopathy. Biopsy of the right axilla on 07/13/19 showed carcinoma consistent with breast carcinoma, HER-2 negative (1+), ER+ 95%, PR+ 75%, Ki67 25%. 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  Interval enlargement of pulmonary nodule 1.3 cm was previously 7 mm CT CAP 05/12/2022: No new progression.    Current treatment: Faslodex with Ibrance started 01/20/2022   Faslodex toxicities: Tolerating it extremely well   Ibrance toxicities:   1.  Diverticulitis: Intermittent diarrhea 2.  No evidence of leukopenia. 3.  Fatigue: stable, on reduced dose of Ibrance.  I am unsure if the leg pain is related to the Faslodex injection.  It could have been related to a viral syndrome.  I told Arrow that the only way to know is for her to receive the Faslodex again.  We discussed taking Tylenol or Aleve or 1 Tylenol and 1 Aleve BID PRN with food for the pain.  I reviewed with her that her kidney function is elevated and she really needs to work on drinking fluids to maintain adequate elimination of the medicine she is receiving from her body.  Due to the concern  over the leg pain and the issue of difficulty with obtaining lab sticks she will see Dr. Pamelia Hoit in June.  That way he can discuss next steps and options at that time.   All questions were answered. The patient knows to call the clinic with any problems, questions or concerns. We can certainly see the patient much sooner if necessary.  Total encounter time:20 minutes*in face-to-face visit time, chart review, lab review, care coordination, order entry, and documentation of the encounter time.    Lillard Anes, NP 06/11/22 12:08 PM Medical Oncology and Hematology [facility] [address] [place], [place] [address] Tel. [phone]    Fax. [phone]  *Total Encounter Time as defined by the Centers for Medicare and Medicaid Services includes, in addition to the face-to-face time of a patient visit (documented in the note above) non-face-to-face time: obtaining and reviewing outside history, ordering and reviewing medications, tests or procedures, care coordination (communications with other health care professionals or caregivers) and documentation in the medical record.

## 2022-06-11 NOTE — Patient Instructions (Signed)

## 2022-06-14 ENCOUNTER — Other Ambulatory Visit (HOSPITAL_COMMUNITY): Payer: Self-pay

## 2022-06-17 ENCOUNTER — Emergency Department (HOSPITAL_COMMUNITY): Payer: Medicare Other

## 2022-06-17 ENCOUNTER — Emergency Department (HOSPITAL_COMMUNITY)
Admission: EM | Admit: 2022-06-17 | Discharge: 2022-06-17 | Disposition: A | Discharge status: Home / Self Care | Payer: Medicare Other | Attending: Emergency Medicine | Admitting: Emergency Medicine

## 2022-06-17 ENCOUNTER — Encounter (HOSPITAL_COMMUNITY): Payer: Self-pay

## 2022-06-17 DIAGNOSIS — C7952 Secondary malignant neoplasm of bone marrow: Secondary | ICD-10-CM | POA: Insufficient documentation

## 2022-06-17 DIAGNOSIS — S79911A Unspecified injury of right hip, initial encounter: Secondary | ICD-10-CM | POA: Diagnosis present

## 2022-06-17 DIAGNOSIS — E119 Type 2 diabetes mellitus without complications: Secondary | ICD-10-CM | POA: Diagnosis not present

## 2022-06-17 DIAGNOSIS — Z853 Personal history of malignant neoplasm of breast: Secondary | ICD-10-CM | POA: Diagnosis not present

## 2022-06-17 DIAGNOSIS — Z8616 Personal history of COVID-19: Secondary | ICD-10-CM | POA: Diagnosis not present

## 2022-06-17 DIAGNOSIS — W01198A Fall on same level from slipping, tripping and stumbling with subsequent striking against other object, initial encounter: Secondary | ICD-10-CM | POA: Insufficient documentation

## 2022-06-17 DIAGNOSIS — F17210 Nicotine dependence, cigarettes, uncomplicated: Secondary | ICD-10-CM | POA: Insufficient documentation

## 2022-06-17 DIAGNOSIS — I10 Essential (primary) hypertension: Secondary | ICD-10-CM | POA: Diagnosis not present

## 2022-06-17 DIAGNOSIS — S32591A Other specified fracture of right pubis, initial encounter for closed fracture: Secondary | ICD-10-CM | POA: Diagnosis not present

## 2022-06-17 DIAGNOSIS — Z79899 Other long term (current) drug therapy: Secondary | ICD-10-CM | POA: Diagnosis not present

## 2022-06-17 DIAGNOSIS — R519 Headache, unspecified: Secondary | ICD-10-CM | POA: Insufficient documentation

## 2022-06-17 DIAGNOSIS — C7951 Secondary malignant neoplasm of bone: Secondary | ICD-10-CM

## 2022-06-17 LAB — BASIC METABOLIC PANEL
Anion gap: 10 (ref 5–15)
BUN: 30 mg/dL — ABNORMAL HIGH (ref 8–23)
CO2: 21 mmol/L — ABNORMAL LOW (ref 22–32)
Calcium: 9.4 mg/dL (ref 8.9–10.3)
Chloride: 106 mmol/L (ref 98–111)
Creatinine, Ser: 1.36 mg/dL — ABNORMAL HIGH (ref 0.44–1.00)
GFR, Estimated: 40 mL/min — ABNORMAL LOW (ref >60)
Glucose, Bld: 92 mg/dL (ref 70–99)
Potassium: 3.6 mmol/L (ref 3.5–5.1)
Sodium: 137 mmol/L (ref 135–145)

## 2022-06-17 LAB — CBC
HCT: 30.6 % — ABNORMAL LOW (ref 36.0–46.0)
Hemoglobin: 10.5 g/dL — ABNORMAL LOW (ref 12.0–15.0)
MCH: 35.1 pg — ABNORMAL HIGH (ref 26.0–34.0)
MCHC: 34.3 g/dL (ref 30.0–36.0)
MCV: 102.3 fL — ABNORMAL HIGH (ref 80.0–100.0)
Platelets: 236 10*3/uL (ref 150–400)
RBC: 2.99 MIL/uL — ABNORMAL LOW (ref 3.87–5.11)
RDW: 14.1 % (ref 11.5–15.5)
WBC: 5.4 10*3/uL (ref 4.0–10.5)
nRBC: 0 % (ref 0.0–0.2)

## 2022-06-17 MED ORDER — ACETAMINOPHEN 325 MG PO TABS: 650.0000 mg | ORAL_TABLET | Freq: Four times a day (QID) | ORAL | 0 refills | Status: DC | PRN

## 2022-06-17 MED ORDER — ONDANSETRON HCL 4 MG/2ML IJ SOLN
4.0000 mg | Freq: Once | INTRAMUSCULAR | Status: AC
  Administered 2022-06-17: 4 mg via INTRAVENOUS
  Filled 2022-06-17: qty 2

## 2022-06-17 MED ORDER — OXYCODONE-ACETAMINOPHEN 5-325 MG PO TABS
1.0000 | ORAL_TABLET | Freq: Once | ORAL | Status: AC
  Administered 2022-06-17: 1 via ORAL
  Filled 2022-06-17: qty 1

## 2022-06-17 MED ORDER — SODIUM CHLORIDE 0.9 % IV BOLUS
1000.0000 mL | Freq: Once | INTRAVENOUS | Status: AC
  Administered 2022-06-17: 1000 mL via INTRAVENOUS

## 2022-06-17 MED ORDER — OXYCODONE HCL 5 MG PO TABS: 5.0000 mg | ORAL_TABLET | Freq: Four times a day (QID) | ORAL | 0 refills | Status: DC | PRN

## 2022-06-17 MED ORDER — MORPHINE SULFATE (PF) 2 MG/ML IV SOLN
2.0000 mg | Freq: Once | INTRAVENOUS | Status: AC
  Administered 2022-06-17: 2 mg via INTRAVENOUS
  Filled 2022-06-17: qty 1

## 2022-06-17 NOTE — ED Triage Notes (Signed)
Pt presents with c/o fall that occurred on Monday. Pt reports pain in he right hip since the fall with difficulty putting any weight on that side.

## 2022-06-17 NOTE — ED Provider Notes (Signed)
[place] EMERGENCY DEPARTMENT AT [facility] Provider Note  CSN: [id number] Arrival date & time: 06/17/22 1623  Chief Complaint(s) Fall  HPI Audrey [name] is a 82 y.o. female with past medical history as below, significant for DM2, HLD, migraines, arthritis who presents to the ED with complaint of fall w/ hip and head pain.  Patient reports she had a fall on Monday, she was leaning over to pick something on the floor, she got lightheaded and fell backwards striking the occiput of her head on the car.  Denies LOC.  Patient having intermittent headache since the fall.  No nausea or vomiting, no vision or hearing changes.  Been having worsening right-sided hip pain, unable to bear weight to right hip and has been using walker for the past 4 days.  Typically ambulates without assistance.  Limitations, no chest pain, dyspnea abdominal pain, vomiting.  No change in bowel or bladder function.  She took tramadol at home which did not alleviate her symptoms made her nauseated.  Past Medical History Past Medical History:  Diagnosis Date   Anxiety    Arthritis    "hands" (02/25/2014)   Borderline type 2 diabetes mellitus    Breast cancer, right breast (HCC) 1999   recurrence 08/2017   COVID    March 2021 and early 2022   GERD (gastroesophageal reflux disease)    High cholesterol    "can't take the RX; makes my bones ache" (02/25/2014)   History of hiatal hernia    History of stomach ulcers    Hypertension    Migraines    hx   Pneumonia    PONV (postoperative nausea and vomiting)    at last colonoscopy she was told she was hard to awaken   Shortness of breath dyspnea    w/ exertion    Patient Active Problem List   Diagnosis Date Noted   Abnormal CT of the chest 12/18/2020   Malignant neoplasm of upper-outer quadrant of right breast in female, estrogen receptor positive (HCC) 08/14/2019   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 Authorizing Provider  acetaminophen (TYLENOL) 325 MG tablet Take 2 tablets (650 mg total) by mouth every 6 (six) hours as needed. 06/17/22  Yes Tanda Rockers A, DO  oxyCODONE (ROXICODONE) 5 MG immediate release tablet Take 1 tablet (5 mg total) by mouth every 6 (six) hours as needed for severe pain. 06/17/22  Yes Tanda Rockers A, DO  albuterol (VENTOLIN HFA) 108 (90 Base) MCG/ACT inhaler 2 inhalations every 4-6 hours as needed for cough wheezing and shortness of breath    [provider]  amLODipine (NORVASC) 2.5 MG tablet Take 2.5 mg by mouth daily. 05/16/19   [provider]  cholecalciferol (VITAMIN D3) 25 MCG (1000 UNIT) tablet Take 1,000 Units by mouth daily.    [provider]  cyclobenzaprine (FLEXERIL) 5 MG tablet Take 1 tablet (5 mg total) by mouth 3 (three) times daily as needed for muscle spasms. 04/16/22   Serena Croissant, MD  dicyclomine (BENTYL) 20 MG tablet Take 20 mg by mouth 3 (three) times daily as needed for spasms.    [provider]  hyoscyamine (LEVBID) 0.375 MG 12 hr tablet Take 0.375 mg by mouth every 12 (twelve) hours as needed for cramping.    [provider]  ketotifen (ZADITOR) 0.025 % ophthalmic solution Place 1 drop into both eyes 2 (two)  times daily as needed (dry eyes).    [provider]  meclizine (ANTIVERT) 25 MG tablet Take 1 tablet (25 mg total) by mouth 3 (three) times daily as needed for dizziness. 05/14/22   Serena Croissant, MD  Multiple Vitamin (MULTIVITAMIN) capsule Take 1 capsule by mouth daily. 09/03/15   [provider]  omeprazole (PRILOSEC) 20 MG capsule Take 20 mg by mouth daily as needed (acid reflux).    [provider]  ondansetron (ZOFRAN) 4 MG tablet Take 4 mg by mouth every 8 (eight) hours as needed for nausea or vomiting.    [provider]  palbociclib (IBRANCE) 75 MG tablet Take 1 tablet (75 mg total) by mouth daily.  Take for 21 days on, 7 days off, repeat every 28 days. 04/19/22   Serena Croissant, MD  polyethylene glycol (MIRALAX / GLYCOLAX) 17 g packet Take 17 g by mouth daily as needed for moderate constipation.    [provider]  Zoster Vaccine Adjuvanted [facility]) injection Inject 0.5 mLs into the muscle.    [provider]                                                                                                                                    Past Surgical History Past Surgical History:  Procedure Laterality Date   APPENDECTOMY     BREAST BIOPSY Right 1999   BREAST LUMPECTOMY Right 1999   BRONCHIAL BIOPSY  12/29/2020   Procedure: BRONCHIAL BIOPSIES;  Surgeon: Leslye Peer, MD;  Location: [facility] ENDOSCOPY;  Service: Pulmonary;;   BRONCHIAL BRUSHINGS  12/29/2020   Procedure: BRONCHIAL BRUSHINGS;  Surgeon: Leslye Peer, MD;  Location: [facility] ENDOSCOPY;  Service: Pulmonary;;   BRONCHIAL NEEDLE ASPIRATION BIOPSY  12/29/2020   Procedure: BRONCHIAL NEEDLE ASPIRATION BIOPSIES;  Surgeon: Leslye Peer, MD;  Location: [facility] ENDOSCOPY;  Service: Pulmonary;;   BRONCHIAL WASHINGS  12/29/2020   Procedure: BRONCHIAL WASHINGS;  Surgeon: Leslye Peer, MD;  Location: [facility] ENDOSCOPY;  Service: Pulmonary;;   DILATION AND CURETTAGE OF UTERUS     FINE NEEDLE ASPIRATION  12/29/2020   Procedure: FINE NEEDLE ASPIRATION (FNA) LINEAR;  Surgeon: Leslye Peer, MD;  Location: [facility] ENDOSCOPY;  Service: Pulmonary;;   PARTIAL KNEE ARTHROPLASTY Left 02/25/2014   Procedure: LEFT UNICOMPARTMENTAL (medial compartment) KNEE;  Surgeon: Dannielle Huh, MD;  Location: [facility] OR;  Service: Orthopedics;  Laterality: Left;   REPLACEMENT UNICONDYLAR JOINT KNEE Left 02/25/2014   TONSILLECTOMY     VAGINAL HYSTERECTOMY     VIDEO BRONCHOSCOPY WITH ENDOBRONCHIAL ULTRASOUND N/A 12/29/2020   Procedure: VIDEO BRONCHOSCOPY WITH ENDOBRONCHIAL ULTRASOUND;  Surgeon: Leslye Peer, MD;  Location: [facility] ENDOSCOPY;  Service: Pulmonary;   Laterality: N/A;   VIDEO BRONCHOSCOPY WITH RADIAL ENDOBRONCHIAL ULTRASOUND  12/29/2020   Procedure: RADIAL ENDOBRONCHIAL ULTRASOUND;  Surgeon: Leslye Peer, MD;  Location: [facility] ENDOSCOPY;  Service: Pulmonary;;   Family History History reviewed. No pertinent family  history.  Social History Social History   Tobacco Use   Smoking status: Some Days    Packs/day: 0.75    Years: 10.00    Additional pack years: 0.00    Total pack years: 7.50    Types: Cigarettes    Start date: 11/05/2002    Last attempt to quit: 02/12/2011    Years since quitting: 11.3   Smokeless tobacco: Never   Tobacco comments:    Smokes 7 - 10 cigarettes a day [facility] 12/18/20  Substance Use Topics   Alcohol use: No   Drug use: No   Allergies Ativan [lorazepam], Cefuroxime, Aspirin, Codeine, Cyclosporine modified, and Penicillins  Review of Systems Review of Systems  Constitutional:  Negative for activity change and fever.  HENT:  Negative for facial swelling and trouble swallowing.   Eyes:  Negative for discharge and redness.  Respiratory:  Negative for cough and shortness of breath.   Cardiovascular:  Negative for chest pain and palpitations.  Gastrointestinal:  Negative for abdominal pain and nausea.  Genitourinary:  Negative for dysuria and flank pain.  Musculoskeletal:  Positive for arthralgias and gait problem. Negative for back pain.  Skin:  Negative for pallor and rash.  Neurological:  Positive for headaches. Negative for syncope.    Physical Exam Vital Signs  I have reviewed the triage vital signs BP (!) 170/67 (BP Location: Right Arm)   Pulse 90   Temp 97.9 F (36.6 C) (Oral)   Resp 15   SpO2 100%  Physical Exam Vitals and nursing note reviewed.  Constitutional:      General: She is not in acute distress.    Appearance: Normal appearance.  HENT:     Head: Normocephalic and atraumatic. No raccoon eyes, Battle's sign, right periorbital erythema or left periorbital erythema.     Jaw: There  is normal jaw occlusion. No trismus.     Right Ear: External ear normal.     Left Ear: External ear normal.     Nose: Nose normal.     Mouth/Throat:     Mouth: Mucous membranes are moist.  Eyes:     General: No scleral icterus.       Right eye: No discharge.        Left eye: No discharge.  Cardiovascular:     Rate and Rhythm: Normal rate and regular rhythm.     Pulses: Normal pulses.     Heart sounds: Normal heart sounds.  Pulmonary:     Effort: Pulmonary effort is normal. No respiratory distress.     Breath sounds: Normal breath sounds.  Abdominal:     General: Abdomen is flat.     Tenderness: There is no abdominal tenderness.  Musculoskeletal:        General: Normal range of motion.     Cervical back: No rigidity.     Right lower leg: Tenderness present. No edema.     Left lower leg: No tenderness. No edema.       Legs:  Skin:    General: Skin is warm and dry.     Capillary Refill: Capillary refill takes less than 2 seconds.  Neurological:     Mental Status: She is alert and oriented to person, place, and time.     GCS: GCS eye subscore is 4. GCS verbal subscore is 5. GCS motor subscore is 6.     Cranial Nerves: Cranial nerves 2-12 are intact.     Sensory: Sensation is intact.  Motor: Motor function is intact.     Coordination: Coordination is intact.     Comments: Gait testing deferred secondary to right hip pain  Reduced strength RLE 2/2 right hip pain. Strength 5/5 BLUE, LLE B/L LE NVI  Psychiatric:        Mood and Affect: Mood normal.        Behavior: Behavior normal.     ED Results and Treatments Labs (all labs ordered are listed, but only abnormal results are displayed) Labs Reviewed  CBC - Abnormal; Notable for the following components:      Result Value   RBC 2.99 (*)    Hemoglobin 10.5 (*)    HCT 30.6 (*)    MCV 102.3 (*)    MCH 35.1 (*)    All other components within normal limits  BASIC METABOLIC PANEL - Abnormal; Notable for the following  components:   CO2 21 (*)    BUN 30 (*)    Creatinine, Ser 1.36 (*)    GFR, Estimated 40 (*)    All other components within normal limits                                                                                                                          Radiology CT Hip Right Wo Contrast  Result Date: 06/17/2022 CLINICAL DATA:  Status post recent fall. EXAM: CT OF THE RIGHT HIP WITHOUT CONTRAST TECHNIQUE: Multidetector CT imaging of the right hip was performed according to the standard protocol. Multiplanar CT image reconstructions were also generated. RADIATION DOSE REDUCTION: This exam was performed according to the departmental dose-optimization program which includes automated exposure control, adjustment of the mA and/or kV according to patient size and/or use of iterative reconstruction technique. COMPARISON:  None Available. FINDINGS: Bones/Joint/Cartilage An acute, nondisplaced fracture deformity is seen involving the right inferior pubic ramus. A very small, nondisplaced fracture of the lateral aspect of the right superior pubic ramus is suspected (axial CT image 37, CT series 5/sagittal reformatted images 70 and 71, CT series 10). There is no evidence of dislocation. Ligaments Suboptimally assessed by CT. Muscles and Tendons Unremarkable. Soft tissues Unremarkable. IMPRESSION: 1. Acute, nondisplaced fracture of the right inferior pubic ramus. 2. Suspected very small, nondisplaced fracture of the lateral aspect of the right superior pubic ramus. Electronically Signed   By: Aram Candela M.D.   On: 06/17/2022 19:30   CT Head Wo Contrast  Result Date: 06/17/2022 CLINICAL DATA:  Fall, neck trauma, metastatic breast cancer EXAM: CT HEAD WITHOUT CONTRAST CT CERVICAL SPINE WITHOUT CONTRAST TECHNIQUE: Multidetector CT imaging of the head and cervical spine was performed following the standard protocol without intravenous contrast. Multiplanar CT image reconstructions of the cervical spine were  also generated. RADIATION DOSE REDUCTION: This exam was performed according to the departmental dose-optimization program which includes automated exposure control, adjustment of the mA and/or kV according to patient size and/or use of iterative reconstruction technique. COMPARISON:  None Available. FINDINGS: CT HEAD  FINDINGS Brain: No evidence of acute infarction, hemorrhage, hydrocephalus, extra-axial collection or mass lesion/mass effect. Vascular: No hyperdense vessel or unexpected calcification. 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 Displaced fracture. Sinuses/Orbits: No acute finding. Other: None. CT CERVICAL SPINE FINDINGS Alignment: Normal. Skull base and vertebrae: No acute fracture. No primary bone lesion or focal pathologic process. Soft tissues and spinal canal: No prevertebral fluid or swelling. No visible canal hematoma. Disc levels: Focally mild disc space height loss and osteophytosis at C5-C6, with otherwise generally intact disc spaces. Upper chest: Negative. Other: None. IMPRESSION: 1. No acute intracranial pathology. 2. 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 3. No fracture or static subluxation of the cervical spine. Electronically Signed   By: Jearld Lesch M.D.   On: 06/17/2022 19:28   CT Cervical Spine Wo Contrast  Result Date: 06/17/2022 CLINICAL DATA:  Fall, neck trauma, metastatic breast cancer EXAM: CT HEAD WITHOUT CONTRAST CT CERVICAL SPINE WITHOUT CONTRAST TECHNIQUE: Multidetector CT imaging of the head and cervical spine was performed following the standard protocol without intravenous contrast. Multiplanar CT image reconstructions of the cervical spine were also generated. RADIATION DOSE REDUCTION: This exam was performed according to the departmental dose-optimization program which includes automated exposure control, adjustment of  the mA and/or kV according to patient size and/or use of iterative reconstruction technique. COMPARISON:  None Available. FINDINGS: CT HEAD FINDINGS Brain: No evidence of acute infarction, hemorrhage, hydrocephalus, extra-axial collection or mass lesion/mass effect. Vascular: No hyperdense vessel or unexpected calcification. 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 Displaced fracture. Sinuses/Orbits: No acute finding. Other: None. CT CERVICAL SPINE FINDINGS Alignment: Normal. Skull base and vertebrae: No acute fracture. No primary bone lesion or focal pathologic process. Soft tissues and spinal canal: No prevertebral fluid or swelling. No visible canal hematoma. Disc levels: Focally mild disc space height loss and osteophytosis at C5-C6, with otherwise generally intact disc spaces. Upper chest: Negative. Other: None. IMPRESSION: 1. No acute intracranial pathology. 2. 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 3. No fracture or static subluxation of the cervical spine. Electronically Signed   By: Jearld Lesch M.D.   On: 06/17/2022 19:28   DG Hip Unilat W or Wo Pelvis 2-3 Views Right  Result Date: 06/17/2022 CLINICAL DATA:  Status post fall. EXAM: DG HIP (WITH OR WITHOUT PELVIS) 2-3V RIGHT COMPARISON:  None Available. FINDINGS: There is no evidence of hip fracture or dislocation. Degenerative changes are seen in the form of joint space narrowing. IMPRESSION: Degenerative changes without evidence of an acute osseous abnormality. Electronically Signed   By: Aram Candela M.D.   On: 06/17/2022 18:14    Pertinent labs & imaging results that were available during my care of the patient were reviewed by me and considered in my medical decision making (see MDM for details).  Medications Ordered in ED Medications  oxyCODONE-acetaminophen (PERCOCET/ROXICET) 5-325 MG per  tablet 1 tablet (has no administration in time range)  sodium chloride 0.9 % bolus 1,000 mL (0 mLs Intravenous Stopped 06/17/22 2037)  morphine (PF) 2 MG/ML injection 2 mg (2 mg Intravenous Given 06/17/22 1802)  ondansetron (ZOFRAN) injection 4 mg (4 mg Intravenous Given 06/17/22 1801)  Procedures Procedures  (including critical care time)  Medical Decision Making / ED Course    Medical Decision Making:    VERGIL GOSCH is a 82 y.o. female with past medical history as below, significant for DM2, HLD, migraines, arthritis who presents to the ED with complaint of fall w/ hip and head pain. . The complaint involves an extensive differential diagnosis and also carries with it a high risk of complications and morbidity.  Serious etiology was considered. Ddx includes but is not limited to: Differential diagnoses for head trauma includes subdural hematoma, epidural hematoma, acute concussion, traumatic subarachnoid hemorrhage, cerebral contusions, etc. Sprain, strain, soft tissue injury, fracture, dislocation, etc    Complete initial physical exam performed, notably the patient  was resting comfortably, neuro exam is non focal, ttp right hip w/ limited ROM, pulses equal LE.    Reviewed and confirmed nursing documentation for past medical history, family history, social history.  Vital signs reviewed.    Clinical Course as of 06/17/22 2037  Thu Jun 17, 2022  1950 CT hip: "IMPRESSION: 1. Acute, nondisplaced fracture of the right inferior pubic ramus. 2. Suspected very small, nondisplaced fracture of the lateral aspect of the right superior pubic ramus." [SG]  1950 CTH: "2. 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  No signs of mets to her brain, no mental status changes. Admission was recommended to the patient and family for pain control and to set up outpatient resources for home including physical therapy, nursing. Ortho eval. Patient and family at bedside prefer to go home this evening, they will follow-up with orthopedics as an outpatient.  She has been using walker to get around for the past 4 days and they will continue doing this. Will return if anything changes They will discuss with PCP if they need any more help at home She has seen Dr. Sherlean Foot in the past for left knee, will give on-call Ortho follow-up Discussed protected weightbearing, analgesia for home, strict return precautions  The patient improved significantly and was discharged in stable condition. Detailed discussions were had with the patient regarding current findings, and need for close f/u with PCP or on call doctor. The patient has been instructed to return immediately if the symptoms worsen in any way for re-evaluation. Patient verbalized understanding and is in agreement with current care plan. All questions answered prior to discharge.            Additional history obtained: -Additional history obtained from family -External records from outside source obtained and reviewed including: Chart review including previous notes, labs, imaging, consultation notes including primary care documentation, prior labs and imaging   Lab Tests: -I ordered, reviewed, and interpreted labs.   The pertinent results include:   Labs Reviewed  CBC - Abnormal; Notable for the following components:      Result Value   RBC 2.99 (*)    Hemoglobin 10.5 (*)    HCT 30.6 (*)    MCV  102.3 (*)    MCH 35.1 (*)    All other components within normal limits  BASIC METABOLIC PANEL -  Abnormal; Notable for the following components:   CO2 21 (*)    BUN 30 (*)    Creatinine, Ser 1.36 (*)    GFR, Estimated 40 (*)    All other components within normal limits    Notable for stable labs  EKG   EKG Interpretation  Date/Time:    Ventricular Rate:    PR Interval:    QRS Duration:   QT Interval:    QTC Calculation:   R Axis:     Text Interpretation:           Imaging Studies ordered: I ordered imaging studies including CTH/ CT r hip CT c spine XR hip I independently visualized the following imaging with scope of interpretation limited to determining acute life threatening conditions related to emergency care; findings noted above, significant for pelvic fx, ?lytic lesions to skull I independently visualized and interpreted imaging. I agree with the radiologist interpretation   Medicines ordered and prescription drug management: Meds ordered this encounter  Medications   sodium chloride 0.9 % bolus 1,000 mL   morphine (PF) 2 MG/ML injection 2 mg   ondansetron (ZOFRAN) injection 4 mg   oxyCODONE-acetaminophen (PERCOCET/ROXICET) 5-325 MG per tablet 1 tablet   oxyCODONE (ROXICODONE) 5 MG immediate release tablet    Sig: Take 1 tablet (5 mg total) by mouth every 6 (six) hours as needed for severe pain.    Dispense:  12 tablet    Refill:  0   acetaminophen (TYLENOL) 325 MG tablet    Sig: Take 2 tablets (650 mg total) by mouth every 6 (six) hours as needed.    Dispense:  36 tablet    Refill:  0    -I have reviewed the patients home medicines and have made adjustments as needed   Consultations Obtained: na   Cardiac Monitoring: The patient was maintained on a cardiac monitor.  I personally viewed and interpreted the cardiac monitored which showed an underlying rhythm of: NSR  Social Determinants of Health:  Diagnosis or treatment significantly limited by  social determinants of health: current smoker   Reevaluation: After the interventions noted above, I reevaluated the patient and found that they have improved  Co morbidities that complicate the patient evaluation  Past Medical History:  Diagnosis Date   Anxiety    Arthritis    "hands" (02/25/2014)   Borderline type 2 diabetes mellitus    Breast cancer, right breast (HCC) 1999   recurrence 08/2017   COVID    March 2021 and early 2022   GERD (gastroesophageal reflux disease)    High cholesterol    "can't take the RX; makes my bones ache" (02/25/2014)   History of hiatal hernia    History of stomach ulcers    Hypertension    Migraines    hx   Pneumonia    PONV (postoperative nausea and vomiting)    at last colonoscopy she was told she was hard to awaken   Shortness of breath dyspnea    w/ exertion       Dispostion: Disposition decision including need for hospitalization was considered, and patient discharged from emergency department.    Final Clinical Impression(s) / ED Diagnoses Final diagnoses:  Closed fracture of ramus of right pubis, initial encounter [facility])  Malignant neoplasm metastatic to bone [facility])     This chart was dictated using voice recognition software.  Despite best efforts to proofread,  errors can occur which can change the documentation meaning.    Wallace Cullens,  Paul Dykes, DO 06/17/22 2037

## 2022-06-17 NOTE — Discharge Instructions (Addendum)
It was a pleasure caring for you today in the emergency department.  Please return to the emergency department for any worsening or worrisome symptoms.  CAT scan imaging today of your head did show possible metastatic lesions to the bones of your skull.  Please follow-up with your cancer specialist.  You have a fracture in your pelvis, recommend bedrest and follow-up with orthopedic surgery as an outpatient.    Please continue to use walker  Please return to the emergency department if your pain becomes severe, you have numbness or tingling to lower extremities, abdominal pain, nausea vomiting or any other worsening worrisome symptoms.

## 2022-07-04 NOTE — Progress Notes (Signed)
Patient Care Team: Maris Berger, MD as PCP - General (Family Medicine)  DIAGNOSIS:  Encounter Diagnosis  Name Primary?   Malignant neoplasm of upper-outer quadrant of right breast in female, estrogen receptor positive (HCC) Yes    SUMMARY OF ONCOLOGIC HISTORY: Oncology History  Malignant neoplasm of upper-outer quadrant of right breast in female, estrogen receptor positive (HCC)  07/13/2019 Initial Diagnosis   History of right breast cancer on 2017. Chest CT on 06/06/19 showed a mass in the right axilla and a mass in the right lower lung lobe. PET scan on 07/03/19 showed a right lower lobe pulmonary nodule concerning for malignancy, the right axillary mass consistent with local breast cancer recurrence, and small hypermetabolic right axillary and neck nodes consistent with metastatic adenopathy. Biopsy of the right axilla on 07/13/19 showed carcinoma consistent with breast carcinoma, HER-2 negative (1+), ER+ 95%, PR+ 75%, Ki67 25%.   08/14/2019 -  Anti-estrogen oral therapy   Initially anastrozole alone,   Verzenio October 2022 (Verzenio discontinued for diarrhea), switched to [facility] 12/08/2020     CHIEF COMPLIANT:  Follow-up on Faslodex and Anastrozole and Ibrance   INTERVAL HISTORY: Audrey [name] is a 82 year old above-mentioned history of metastatic breast cancer on anastrozole. She presents to the clinic today for a follow-up. She reports that she is doing ok. She did have a fall a week ago. She fractured her pubic bone. She complains of back hurting in between shoulder blades.(T2). She is getting around better since the fall.   ALLERGIES:  is allergic to ativan [lorazepam], cefuroxime, aspirin, codeine, cyclosporine modified, and penicillins.  MEDICATIONS:  Current Outpatient Medications  Medication Sig Dispense Refill   acetaminophen (TYLENOL) 325 MG tablet Take 2 tablets (650 mg total) by mouth every 6 (six) hours as needed. 36 tablet 0   albuterol (VENTOLIN HFA) 108 (90  Base) MCG/ACT inhaler 2 inhalations every 4-6 hours as needed for cough wheezing and shortness of breath     amLODipine (NORVASC) 2.5 MG tablet Take 2.5 mg by mouth daily.     cholecalciferol (VITAMIN D3) 25 MCG (1000 UNIT) tablet Take 1,000 Units by mouth daily.     cyclobenzaprine (FLEXERIL) 5 MG tablet Take 1 tablet (5 mg total) by mouth 3 (three) times daily as needed for muscle spasms. 30 tablet 3   dicyclomine (BENTYL) 20 MG tablet Take 20 mg by mouth 3 (three) times daily as needed for spasms.     hyoscyamine (LEVBID) 0.375 MG 12 hr tablet Take 0.375 mg by mouth every 12 (twelve) hours as needed for cramping.     ketotifen (ZADITOR) 0.025 % ophthalmic solution Place 1 drop into both eyes 2 (two) times daily as needed (dry eyes).     meclizine (ANTIVERT) 25 MG tablet Take 1 tablet (25 mg total) by mouth 3 (three) times daily as needed for dizziness. 30 tablet 0   Multiple Vitamin (MULTIVITAMIN) capsule Take 1 capsule by mouth daily.     omeprazole (PRILOSEC) 20 MG capsule Take 20 mg by mouth daily as needed (acid reflux).     ondansetron (ZOFRAN) 4 MG tablet Take 4 mg by mouth every 8 (eight) hours as needed for nausea or vomiting.     oxyCODONE (ROXICODONE) 5 MG immediate release tablet Take 1 tablet (5 mg total) by mouth every 6 (six) hours as needed for severe pain. 12 tablet 0   palbociclib (IBRANCE) 75 MG tablet Take 1 tablet (75 mg total) by mouth daily. Take for 21 days on, 7 days  off, repeat every 28 days. 21 tablet 3   polyethylene glycol (MIRALAX / GLYCOLAX) 17 g packet Take 17 g by mouth daily as needed for moderate constipation.     Zoster Vaccine Adjuvanted [facility]) injection Inject 0.5 mLs into the muscle.     No current facility-administered medications for this visit.   Facility-Administered Medications Ordered in Other Visits  Medication Dose Route Frequency Provider Last Rate Last Admin   fulvestrant (FASLODEX) injection 500 mg  500 mg Intramuscular Once Serena Croissant,  MD        PHYSICAL EXAMINATION: ECOG PERFORMANCE STATUS: 1 - Symptomatic but completely ambulatory  Vitals:   07/09/22 1109  BP: (!) 157/62  Pulse: 94  Resp: 18  Temp: 97.7 F (36.5 C)  SpO2: 99%   Filed Weights   07/09/22 1109  Weight: 134 lb 3.2 oz (60.9 kg)      LABORATORY DATA:  I have reviewed the data as listed    Latest Ref Rng & Units 06/17/2022    6:05 PM 05/14/2022    8:41 AM 04/16/2022   11:13 AM  CMP  Glucose 70 - 99 mg/dL 92  87  161   BUN 8 - 23 mg/dL 30  19  25    Creatinine 0.44 - 1.00 mg/dL 0.96  0.45  4.09   Sodium 135 - 145 mmol/L 137  139  141   Potassium 3.5 - 5.1 mmol/L 3.6  4.4  3.8   Chloride 98 - 111 mmol/L 106  105  108   CO2 22 - 32 mmol/L 21  27  26    Calcium 8.9 - 10.3 mg/dL 9.4  9.8  9.8   Total Protein 6.5 - 8.1 g/dL  7.2  7.2   Total Bilirubin 0.3 - 1.2 mg/dL  0.5  0.4   Alkaline Phos 38 - 126 U/L  88  81   AST 15 - 41 U/L  14  13   ALT 0 - 44 U/L  10  9     Lab Results  Component Value Date   WBC 4.4 07/09/2022   HGB 10.5 (L) 07/09/2022   HCT 30.6 (L) 07/09/2022   MCV 102.3 (H) 07/09/2022   PLT 212 07/09/2022   NEUTROABS PENDING 07/09/2022    ASSESSMENT & PLAN:  Malignant neoplasm of upper-outer quadrant of right breast in female, estrogen receptor positive (HCC) H/O right breast cancer on 2017 and 1999: Lumpectomy.  (Bilateral mastectomies in 2017: Seen by Dr. Rennis Harding, could not tolerate antiestrogen therapy) Chest CT on 06/06/19 showed a mass in the right axilla and a mass in the right lower lung lobe. PET scan on 07/03/19 showed a right lower lobe pulmonary nodule concerning for malignancy, the right axillary mass consistent with local breast cancer recurrence, and small hypermetabolic right axillary and neck nodes consistent with metastatic adenopathy. Biopsy of the right axilla on 07/13/19 showed carcinoma consistent with breast carcinoma, HER-2 negative (1+), ER+ 95%, PR+ 75%, Ki67  25%. ---------------------------------------------------------------------------------------------------------------------------------------------- Current treatment: Anastrozole 1 mg daily started 08/14/2019 with Audrey [name] (Verzinio was discontinued because of diarrhea), switched to Faslodex with Audrey [name] 01/20/2022 Ibrance toxicities:   1.  Diverticulitis: Intermittent diarrhea 2. no evidence of leukopenia. 3.  Fatigue: Currently on Ibrance 75 mg 3 weeks on 1 week off starting 05/25/2021.   Faslodex toxicities: Tolerating it extremely well    CT CAP 05/12/2022: Interval improvement in the previously demonstrated mediastinal and hilar lymphadenopathy, right axillary lymph node unchanged, no change in the right lower lobe pulmonary  nodule, scattered bone metastases stable  Bone metastases: We discussed the pros and cons of doing bisphosphonate therapy with Zometa or Xgeva.  She tells me that previously she could not tolerate Prolia and she does not want to receive any more injections.  Continue with monthly Faslodex. Follow-up with me with labs in 2 months and scans will plan for September   No orders of the defined types were placed in this encounter.  The patient has a good understanding of the overall plan. she agrees with it. she will call with any problems that may develop before the next visit here. Total time spent: 30 mins including face to face time and time spent for planning, charting and co-ordination of care   Tamsen Meek, MD 07/09/22    I Janan Ridge am acting as a Neurosurgeon for [employer]  I have reviewed the above documentation for accuracy and completeness, and I agree with the above.

## 2022-07-06 ENCOUNTER — Other Ambulatory Visit (HOSPITAL_COMMUNITY): Payer: Self-pay

## 2022-07-07 ENCOUNTER — Other Ambulatory Visit (HOSPITAL_COMMUNITY): Payer: Self-pay

## 2022-07-09 ENCOUNTER — Inpatient Hospital Stay: Payer: Medicare Other

## 2022-07-09 ENCOUNTER — Inpatient Hospital Stay: Payer: Medicare Other | Attending: Hematology and Oncology

## 2022-07-09 ENCOUNTER — Inpatient Hospital Stay: Payer: Medicare Other | Admitting: Hematology and Oncology

## 2022-07-09 ENCOUNTER — Other Ambulatory Visit: Payer: Self-pay

## 2022-07-09 VITALS — BP 157/62 | HR 94 | Temp 97.7°F | Resp 18 | Ht 62.0 in | Wt 134.2 lb

## 2022-07-09 DIAGNOSIS — C7951 Secondary malignant neoplasm of bone: Secondary | ICD-10-CM | POA: Diagnosis not present

## 2022-07-09 DIAGNOSIS — Z885 Allergy status to narcotic agent status: Secondary | ICD-10-CM | POA: Insufficient documentation

## 2022-07-09 DIAGNOSIS — Z880 Allergy status to penicillin: Secondary | ICD-10-CM | POA: Insufficient documentation

## 2022-07-09 DIAGNOSIS — R5383 Other fatigue: Secondary | ICD-10-CM | POA: Diagnosis not present

## 2022-07-09 DIAGNOSIS — Z881 Allergy status to other antibiotic agents status: Secondary | ICD-10-CM | POA: Diagnosis not present

## 2022-07-09 DIAGNOSIS — Z5111 Encounter for antineoplastic chemotherapy: Secondary | ICD-10-CM | POA: Diagnosis present

## 2022-07-09 DIAGNOSIS — C50411 Malignant neoplasm of upper-outer quadrant of right female breast: Secondary | ICD-10-CM

## 2022-07-09 DIAGNOSIS — Z886 Allergy status to analgesic agent status: Secondary | ICD-10-CM | POA: Diagnosis not present

## 2022-07-09 DIAGNOSIS — Z170 Estrogen receptor positive status [ER+]: Secondary | ICD-10-CM | POA: Insufficient documentation

## 2022-07-09 DIAGNOSIS — K5792 Diverticulitis of intestine, part unspecified, without perforation or abscess without bleeding: Secondary | ICD-10-CM | POA: Diagnosis not present

## 2022-07-09 DIAGNOSIS — Z79899 Other long term (current) drug therapy: Secondary | ICD-10-CM | POA: Diagnosis not present

## 2022-07-09 LAB — CBC WITH DIFFERENTIAL (CANCER CENTER ONLY)
Abs Immature Granulocytes: 0.01 10*3/uL (ref 0.00–0.07)
Basophils Absolute: 0.1 10*3/uL (ref 0.0–0.1)
Basophils Relative: 2 %
Eosinophils Absolute: 0.1 10*3/uL (ref 0.0–0.5)
Eosinophils Relative: 1 %
HCT: 30.6 % — ABNORMAL LOW (ref 36.0–46.0)
Hemoglobin: 10.5 g/dL — ABNORMAL LOW (ref 12.0–15.0)
Immature Granulocytes: 0 %
Lymphocytes Relative: 47 %
Lymphs Abs: 2.1 10*3/uL (ref 0.7–4.0)
MCH: 35.1 pg — ABNORMAL HIGH (ref 26.0–34.0)
MCHC: 34.3 g/dL (ref 30.0–36.0)
MCV: 102.3 fL — ABNORMAL HIGH (ref 80.0–100.0)
Monocytes Absolute: 0.2 10*3/uL (ref 0.1–1.0)
Monocytes Relative: 5 %
Neutro Abs: 2 10*3/uL (ref 1.7–7.7)
Neutrophils Relative %: 45 %
Platelet Count: 212 10*3/uL (ref 150–400)
RBC: 2.99 MIL/uL — ABNORMAL LOW (ref 3.87–5.11)
RDW: 13.3 % (ref 11.5–15.5)
Smear Review: NORMAL
WBC Count: 4.4 10*3/uL (ref 4.0–10.5)
nRBC: 0 % (ref 0.0–0.2)

## 2022-07-09 LAB — CMP (CANCER CENTER ONLY)
ALT: 8 U/L (ref 0–44)
AST: 13 U/L — ABNORMAL LOW (ref 15–41)
Albumin: 4.2 g/dL (ref 3.5–5.0)
Alkaline Phosphatase: 100 U/L (ref 38–126)
Anion gap: 7 (ref 5–15)
BUN: 24 mg/dL — ABNORMAL HIGH (ref 8–23)
CO2: 25 mmol/L (ref 22–32)
Calcium: 10.1 mg/dL (ref 8.9–10.3)
Chloride: 106 mmol/L (ref 98–111)
Creatinine: 1.31 mg/dL — ABNORMAL HIGH (ref 0.44–1.00)
GFR, Estimated: 41 mL/min — ABNORMAL LOW (ref >60)
Glucose, Bld: 102 mg/dL — ABNORMAL HIGH (ref 70–99)
Potassium: 4.7 mmol/L (ref 3.5–5.1)
Sodium: 138 mmol/L (ref 135–145)
Total Bilirubin: 0.3 mg/dL (ref 0.3–1.2)
Total Protein: 7.1 g/dL (ref 6.5–8.1)

## 2022-07-09 MED ORDER — FULVESTRANT 250 MG/5ML IM SOSY
500.0000 mg | PREFILLED_SYRINGE | Freq: Once | INTRAMUSCULAR | Status: AC
  Administered 2022-07-09: 500 mg via INTRAMUSCULAR
  Filled 2022-07-09: qty 10

## 2022-07-09 NOTE — Assessment & Plan Note (Signed)
H/O right breast cancer on 2017 and 1999: Lumpectomy.  (Bilateral mastectomies in 2017: Seen by Dr. Rennis Harding, could not tolerate antiestrogen therapy) Chest CT on 06/06/19 showed a mass in the right axilla and a mass in the right lower lung lobe. PET scan on 07/03/19 showed a right lower lobe pulmonary nodule concerning for malignancy, the right axillary mass consistent with local breast cancer recurrence, and small hypermetabolic right axillary and neck nodes consistent with metastatic adenopathy. Biopsy of the right axilla on 07/13/19 showed carcinoma consistent with breast carcinoma, HER-2 negative (1+), ER+ 95%, PR+ 75%, Ki67 25%. ---------------------------------------------------------------------------------------------------------------------------------------------- Current treatment: Anastrozole 1 mg daily started 08/14/2019 with [name] (Verzinio was discontinued because of diarrhea), switched to Faslodex with [name] 01/20/2022 Ibrance toxicities:   1.  Diverticulitis: Intermittent diarrhea 2. no evidence of leukopenia. 3.  Fatigue: Currently on Ibrance 75 mg 3 weeks on 1 week off starting 05/25/2021.   Faslodex toxicities: Tolerating it extremely well    CT CAP 05/12/2022: Interval improvement in the previously demonstrated mediastinal and hilar lymphadenopathy, right axillary lymph node unchanged, no change in the right lower lobe pulmonary nodule, scattered bone metastases stable  Continue with monthly Faslodex. Bone metastasis: We discussed the role of bisphosphonate therapy

## 2022-07-10 LAB — CANCER ANTIGEN 27.29: CA 27.29: 155.5 U/mL — ABNORMAL HIGH (ref 0.0–38.6)

## 2022-07-12 ENCOUNTER — Telehealth: Payer: Self-pay | Admitting: Hematology and Oncology

## 2022-07-12 NOTE — Telephone Encounter (Signed)
Scheduled appointment per 6/28 los. Patient is aware of the made appointment.

## 2022-08-03 ENCOUNTER — Other Ambulatory Visit: Payer: Self-pay

## 2022-08-03 ENCOUNTER — Other Ambulatory Visit: Payer: Self-pay | Admitting: Hematology and Oncology

## 2022-08-03 ENCOUNTER — Other Ambulatory Visit (HOSPITAL_COMMUNITY): Payer: Self-pay

## 2022-08-03 DIAGNOSIS — C50411 Malignant neoplasm of upper-outer quadrant of right female breast: Secondary | ICD-10-CM

## 2022-08-03 IMAGING — CT CT CHEST W/ CM
2 of 4 series · 15 of 36 positions shown, 18 images · IV contrast (agent unspecified)
Comparison: Multiple exams, including 02/02/2021

CLINICAL DATA: Metastatic breast cancer restaging

* Tracking Code: BO *
EXAM:
CT CHEST WITH CONTRAST
TECHNIQUE: Multidetector CT imaging of the chest was performed during
intravenous contrast administration.

[Series 2: axial st · axial · 0.65mm/px · z∈[+1517,+1773]mm · 12 of 152 slices shown, 15 images]
[im 12/152  mediastinal]
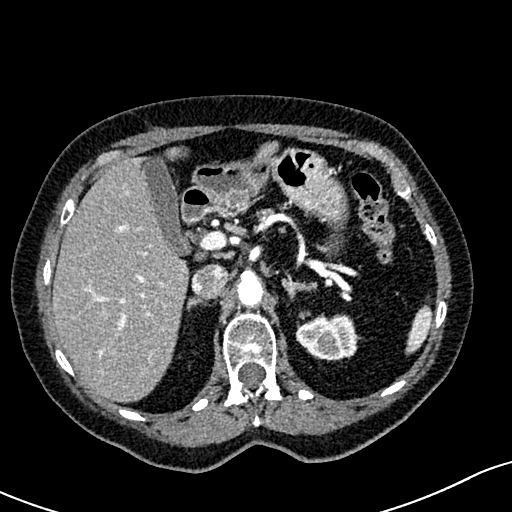
[im 12/152  lung]
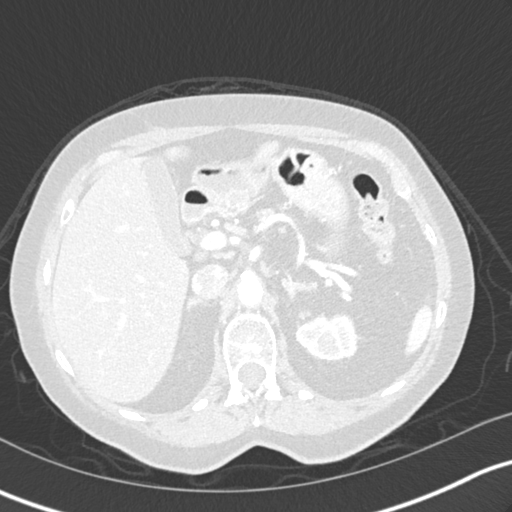
[im 24/152  lung]
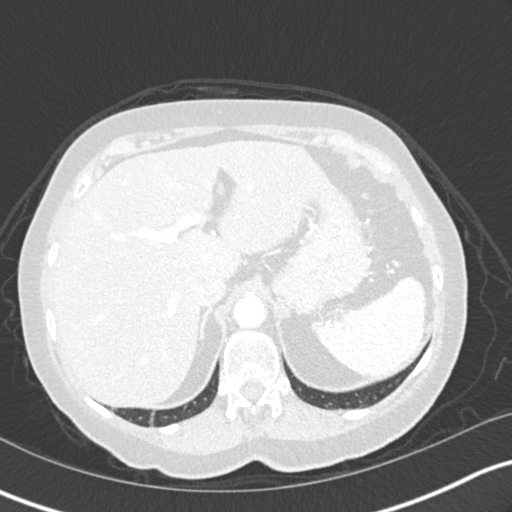
[im 35/152  lung]
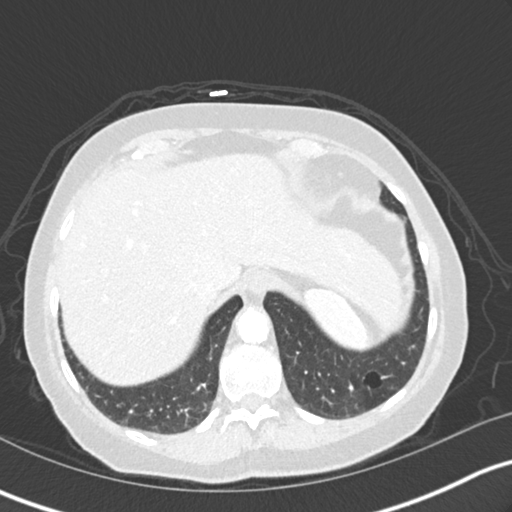
[im 47/152  lung]
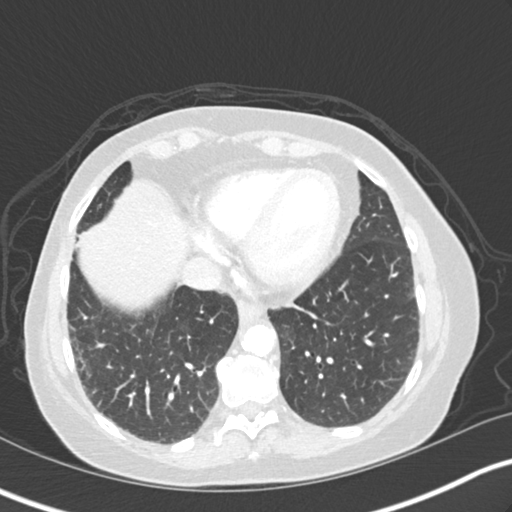
[im 59/152  mediastinal]
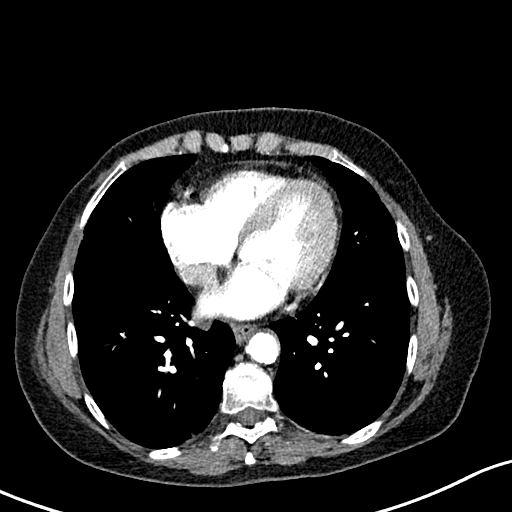
[im 59/152  lung]
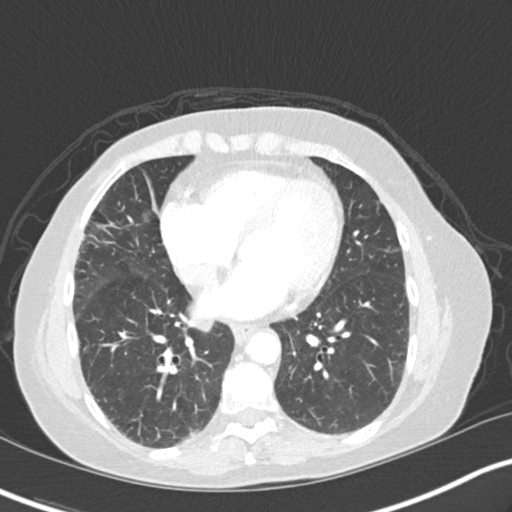
[im 70/152  lung]
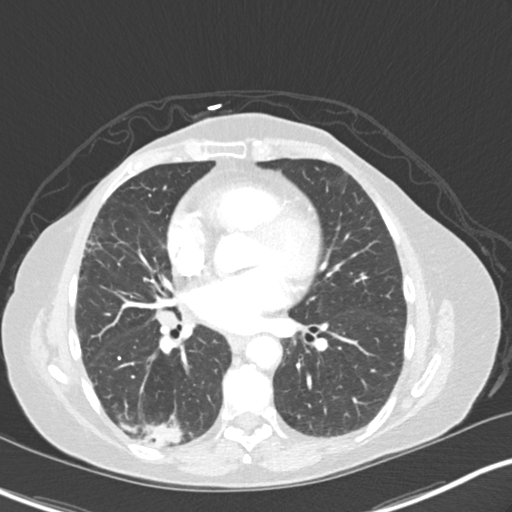
[im 82/152  lung]
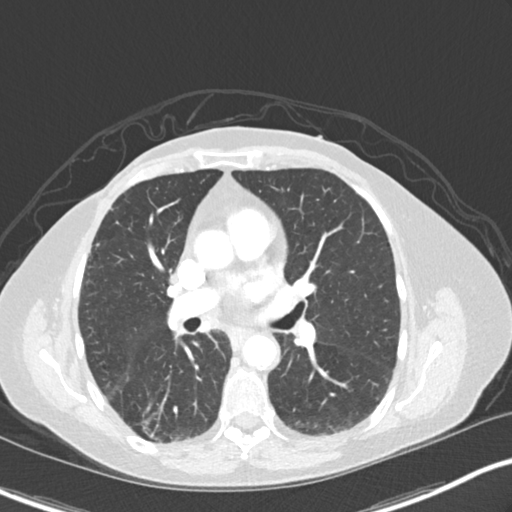
[im 93/152  lung]
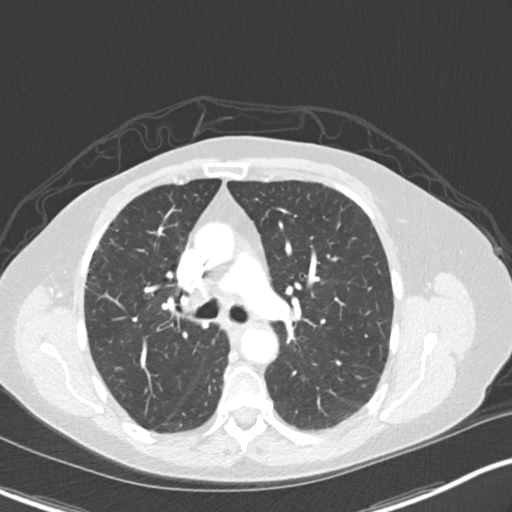
[im 105/152  mediastinal]
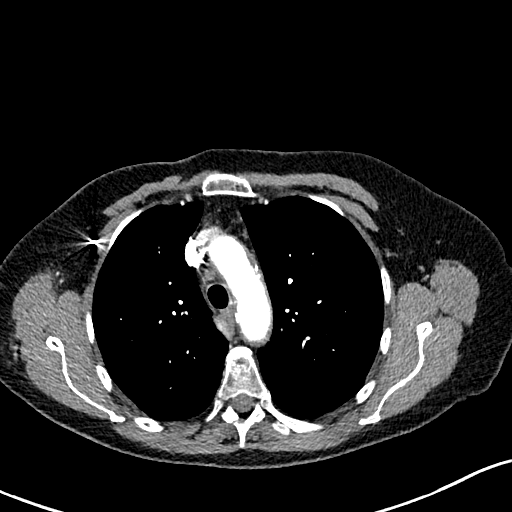
[im 105/152  lung]
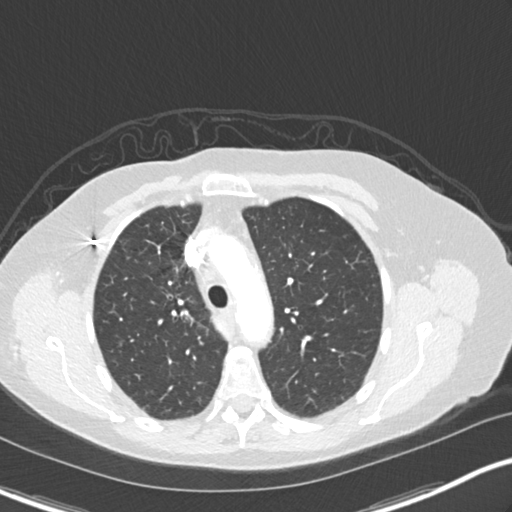
[im 117/152  lung]
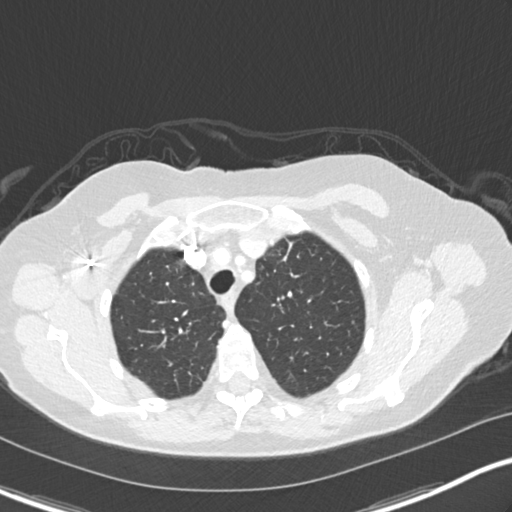
[im 128/152  lung]
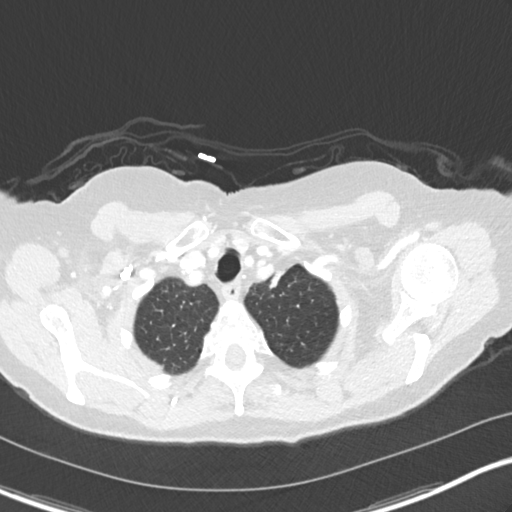
[im 140/152  lung]
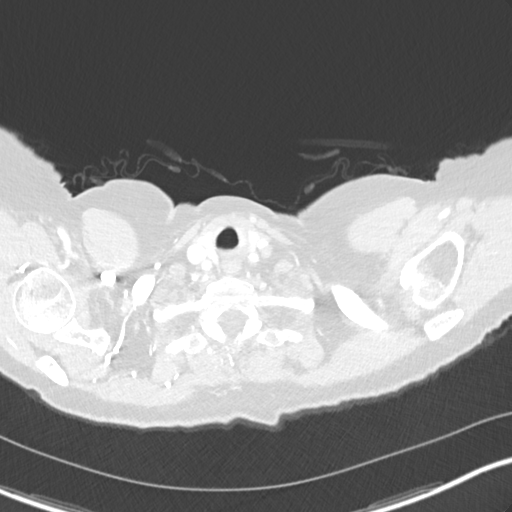

[Series 7: coronal · coronal · 0.62mm/px · 3 of 138 slices shown]
[im 28/138  lung]
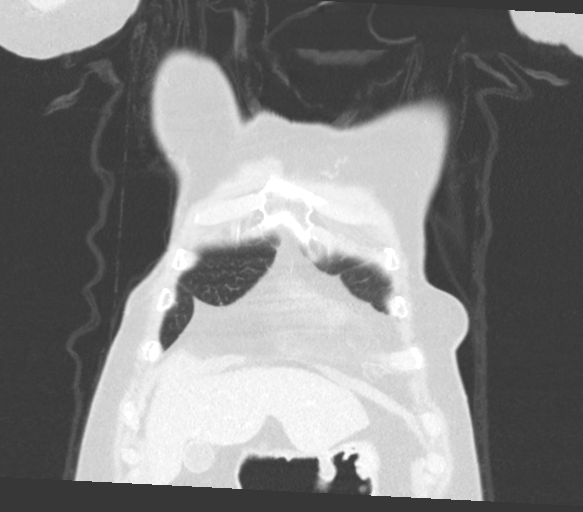
[im 55/138  lung]
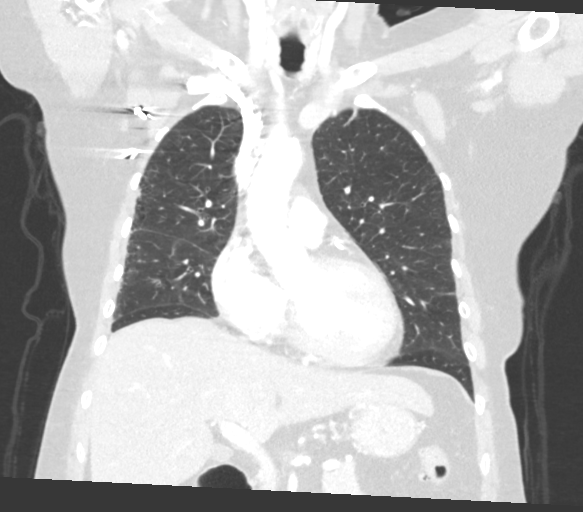
[im 83/138  lung]
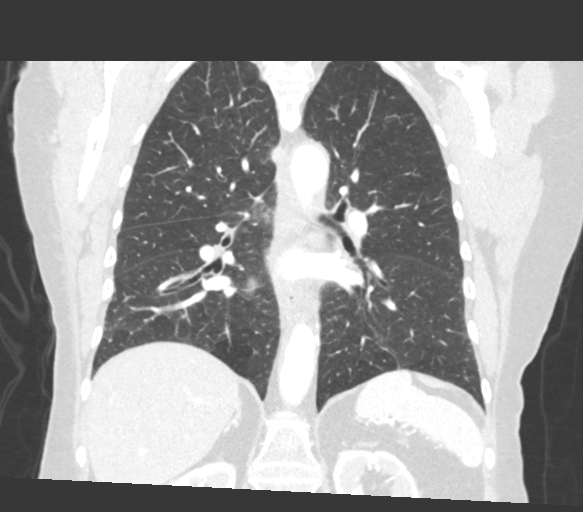

[15 of 36 positions shown; findings below may reference images not displayed]

RADIATION DOSE REDUCTION: This exam was performed according to the
departmental dose-optimization program which includes automated
exposure control, adjustment of the mA and/or kV according to
patient size and/or use of iterative reconstruction technique.

CONTRAST:  75mL OMNIPAQUE IOHEXOL 300 MG/ML  SOLN
FINDINGS: Cardiovascular: Coronary, aortic arch, and branch vessel
atherosclerotic vascular disease. Borderline cardiomegaly.

Mediastinum/Nodes: Right axillary node 2.4 cm in short axis on image
34 series 2, formerly 2.6 cm by my measurements. Right lower
paratracheal node 1.4 cm in short axis on image 53 series 2,
previously 0.7 cm. Right hilar node 0.8 cm in short axis on image 67
series 2, formerly the same by my measurements. Subcarinal
adenopathy 1.0 cm in short axis on image 66 series 2, formerly
cm. Right infrahilar lymph node 0.9 cm in short axis on image 86
series 2, formerly 0.7 cm.

Lungs/Pleura: Biapical pleuroparenchymal scarring, unchanged.

Interstitial accentuation anteriorly in right lung compatible with
prior radiation therapy.

Right lower lobe pulmonary nodule along the diaphragm, 0.8 cm on
image 113 series 5, formerly the same.

Posterior right lower lobe rounded masslike lesion measuring 3.4 by
1.8 cm on image 78 series 5, previously 3.6 by 2.0 cm, previously
hypermetabolic on 11/05/2020.

Stable 4 mm nodule in the superior segment left lower lobe on image
68 series 5.

Upper Abdomen: Hypodense 6 mm lesion posteriorly in the right
hepatic lobe on image 133 series 2, not hypermetabolic on prior
PET-CT, likely benign. Abdominal aortic atherosclerotic
calcification noted with associated atherosclerosis in branches of
the abdominal aorta.

Musculoskeletal: Right mastectomy. Thoracic spondylosis. Stable 5 mm
sclerotic lesion in the T2 vertebral body on image 90 of series 6,
no change back through 06/26/2015.
IMPRESSION: 1. Mild to moderate improvement in thoracic adenopathy compared to
the prior exam. The previously hypermetabolic right lower lobe
oval-shaped masslike lesion measures minimally smaller than
02/02/2021 exam. Stable right lower lobe nodule adjacent to the
diaphragm.
2. Chronically stable small sclerotic lesion in the T2 vertebral
body.
3. Other imaging findings of potential clinical significance: Aortic
Atherosclerosis ([id number]). Coronary and systemic
atherosclerosis. Borderline cardiomegaly. Prior mastectomy and
radiation therapy related findings on the right.

## 2022-08-03 MED ORDER — PALBOCICLIB 75 MG PO TABS
75.0000 mg | ORAL_TABLET | Freq: Every day | ORAL | 3 refills | Status: DC
Start: 2022-08-03 — End: 2022-12-08
  Filled 2022-08-03: qty 21, 28d supply, fill #0
  Filled 2022-08-30: qty 21, 28d supply, fill #1
  Filled 2022-09-29: qty 21, 28d supply, fill #2
  Filled 2022-10-26: qty 21, 28d supply, fill #3

## 2022-08-06 ENCOUNTER — Inpatient Hospital Stay: Payer: Medicare Other | Attending: Hematology and Oncology

## 2022-08-06 ENCOUNTER — Inpatient Hospital Stay: Payer: Medicare Other

## 2022-08-06 ENCOUNTER — Other Ambulatory Visit: Payer: Self-pay

## 2022-08-06 ENCOUNTER — Inpatient Hospital Stay: Payer: Medicare Other | Admitting: Hematology and Oncology

## 2022-08-06 DIAGNOSIS — Z79899 Other long term (current) drug therapy: Secondary | ICD-10-CM | POA: Diagnosis not present

## 2022-08-06 DIAGNOSIS — Z170 Estrogen receptor positive status [ER+]: Secondary | ICD-10-CM | POA: Insufficient documentation

## 2022-08-06 DIAGNOSIS — C50411 Malignant neoplasm of upper-outer quadrant of right female breast: Secondary | ICD-10-CM | POA: Insufficient documentation

## 2022-08-06 DIAGNOSIS — R5383 Other fatigue: Secondary | ICD-10-CM | POA: Insufficient documentation

## 2022-08-06 DIAGNOSIS — C7951 Secondary malignant neoplasm of bone: Secondary | ICD-10-CM | POA: Insufficient documentation

## 2022-08-06 MED ORDER — FULVESTRANT 250 MG/5ML IM SOSY
500.0000 mg | PREFILLED_SYRINGE | Freq: Once | INTRAMUSCULAR | Status: AC
  Administered 2022-08-06: 500 mg via INTRAMUSCULAR
  Filled 2022-08-06: qty 10

## 2022-08-09 ENCOUNTER — Other Ambulatory Visit: Payer: Self-pay

## 2022-08-30 ENCOUNTER — Other Ambulatory Visit (HOSPITAL_COMMUNITY): Payer: Self-pay

## 2022-09-03 ENCOUNTER — Inpatient Hospital Stay: Payer: Medicare Other

## 2022-09-03 ENCOUNTER — Inpatient Hospital Stay: Payer: Medicare Other | Admitting: Adult Health

## 2022-09-03 ENCOUNTER — Encounter: Payer: Self-pay | Admitting: Adult Health

## 2022-09-03 ENCOUNTER — Other Ambulatory Visit (HOSPITAL_COMMUNITY): Payer: Self-pay

## 2022-09-03 ENCOUNTER — Inpatient Hospital Stay: Payer: Medicare Other | Attending: Hematology and Oncology

## 2022-09-03 VITALS — BP 164/54 | HR 110 | Temp 98.4°F | Resp 18 | Ht 62.0 in | Wt 134.5 lb

## 2022-09-03 DIAGNOSIS — Z170 Estrogen receptor positive status [ER+]: Secondary | ICD-10-CM | POA: Diagnosis not present

## 2022-09-03 DIAGNOSIS — K5792 Diverticulitis of intestine, part unspecified, without perforation or abscess without bleeding: Secondary | ICD-10-CM | POA: Insufficient documentation

## 2022-09-03 DIAGNOSIS — R5383 Other fatigue: Secondary | ICD-10-CM | POA: Insufficient documentation

## 2022-09-03 DIAGNOSIS — F17210 Nicotine dependence, cigarettes, uncomplicated: Secondary | ICD-10-CM | POA: Insufficient documentation

## 2022-09-03 DIAGNOSIS — C50411 Malignant neoplasm of upper-outer quadrant of right female breast: Secondary | ICD-10-CM | POA: Insufficient documentation

## 2022-09-03 DIAGNOSIS — Z90710 Acquired absence of both cervix and uterus: Secondary | ICD-10-CM | POA: Diagnosis not present

## 2022-09-03 DIAGNOSIS — L989 Disorder of the skin and subcutaneous tissue, unspecified: Secondary | ICD-10-CM | POA: Diagnosis not present

## 2022-09-03 DIAGNOSIS — Z79899 Other long term (current) drug therapy: Secondary | ICD-10-CM | POA: Insufficient documentation

## 2022-09-03 DIAGNOSIS — C7951 Secondary malignant neoplasm of bone: Secondary | ICD-10-CM | POA: Insufficient documentation

## 2022-09-03 DIAGNOSIS — Z8616 Personal history of COVID-19: Secondary | ICD-10-CM | POA: Insufficient documentation

## 2022-09-03 DIAGNOSIS — Z79810 Long term (current) use of selective estrogen receptor modulators (SERMs): Secondary | ICD-10-CM | POA: Diagnosis not present

## 2022-09-03 DIAGNOSIS — Z9049 Acquired absence of other specified parts of digestive tract: Secondary | ICD-10-CM | POA: Insufficient documentation

## 2022-09-03 LAB — CMP (CANCER CENTER ONLY)
ALT: 10 U/L (ref 0–44)
AST: 14 U/L — ABNORMAL LOW (ref 15–41)
Albumin: 4.6 g/dL (ref 3.5–5.0)
Alkaline Phosphatase: 84 U/L (ref 38–126)
Anion gap: 7 (ref 5–15)
BUN: 22 mg/dL (ref 8–23)
CO2: 27 mmol/L (ref 22–32)
Calcium: 10.2 mg/dL (ref 8.9–10.3)
Chloride: 104 mmol/L (ref 98–111)
Creatinine: 1.26 mg/dL — ABNORMAL HIGH (ref 0.44–1.00)
GFR, Estimated: 43 mL/min — ABNORMAL LOW (ref >60)
Glucose, Bld: 94 mg/dL (ref 70–99)
Potassium: 4.3 mmol/L (ref 3.5–5.1)
Sodium: 138 mmol/L (ref 135–145)
Total Bilirubin: 0.4 mg/dL (ref 0.3–1.2)
Total Protein: 7.4 g/dL (ref 6.5–8.1)

## 2022-09-03 LAB — CBC WITH DIFFERENTIAL (CANCER CENTER ONLY)
Abs Immature Granulocytes: 0.01 10*3/uL (ref 0.00–0.07)
Basophils Absolute: 0.1 10*3/uL (ref 0.0–0.1)
Basophils Relative: 1 %
Eosinophils Absolute: 0 10*3/uL (ref 0.0–0.5)
Eosinophils Relative: 1 %
HCT: 29.9 % — ABNORMAL LOW (ref 36.0–46.0)
Hemoglobin: 10.9 g/dL — ABNORMAL LOW (ref 12.0–15.0)
Immature Granulocytes: 0 %
Lymphocytes Relative: 46 %
Lymphs Abs: 2.6 10*3/uL (ref 0.7–4.0)
MCH: 36.7 pg — ABNORMAL HIGH (ref 26.0–34.0)
MCHC: 36.5 g/dL — ABNORMAL HIGH (ref 30.0–36.0)
MCV: 100.7 fL — ABNORMAL HIGH (ref 80.0–100.0)
Monocytes Absolute: 0.2 10*3/uL (ref 0.1–1.0)
Monocytes Relative: 4 %
Neutro Abs: 2.7 10*3/uL (ref 1.7–7.7)
Neutrophils Relative %: 48 %
Platelet Count: 239 10*3/uL (ref 150–400)
RBC: 2.97 MIL/uL — ABNORMAL LOW (ref 3.87–5.11)
RDW: 13.8 % (ref 11.5–15.5)
Smear Review: NORMAL
WBC Count: 5.5 10*3/uL (ref 4.0–10.5)
nRBC: 0 % (ref 0.0–0.2)

## 2022-09-03 MED ORDER — FULVESTRANT 250 MG/5ML IM SOSY
500.0000 mg | PREFILLED_SYRINGE | Freq: Once | INTRAMUSCULAR | Status: AC
  Administered 2022-09-03: 500 mg via INTRAMUSCULAR
  Filled 2022-09-03: qty 10

## 2022-09-03 MED ORDER — DOXYCYCLINE HYCLATE 100 MG PO TABS
100.0000 mg | ORAL_TABLET | Freq: Two times a day (BID) | ORAL | 0 refills | Status: DC
Start: 2022-09-03 — End: 2022-12-14

## 2022-09-03 NOTE — Progress Notes (Signed)
[facility] Cancer Follow up:    Audrey Berger, MD [address] [place] [address]   DIAGNOSIS: Stage IV breast cancer  SUMMARY OF ONCOLOGIC HISTORY: Oncology History  Malignant neoplasm of upper-outer quadrant of right breast in female, estrogen receptor positive (HCC)  07/13/2019 Initial Diagnosis   History of right breast cancer on 2017. Chest CT on 06/06/19 showed a mass in the right axilla and a mass in the right lower lung lobe. PET scan on 07/03/19 showed a right lower lobe pulmonary nodule concerning for malignancy, the right axillary mass consistent with local breast cancer recurrence, and small hypermetabolic right axillary and neck nodes consistent with metastatic adenopathy. Biopsy of the right axilla on 07/13/19 showed carcinoma consistent with breast carcinoma, HER-2 negative (1+), ER+ 95%, PR+ 75%, Ki67 25%.   08/14/2019 -  Anti-estrogen oral therapy   Initially anastrozole alone,   Verzenio October 2022 (Verzenio discontinued for diarrhea), switched to [facility] 12/08/2020   09/03/2022 Cancer Staging   Staging form: Breast, AJCC 8th Edition - Clinical: Stage IV (pM1) - Signed by Loa Socks, NP on 09/03/2022     CURRENT THERAPY: Fulvestrant, [name]  INTERVAL HISTORY: Audrey [name] 82 y.o. female returns for follow-up of her metastatic breast cancer.  She continues on fulvestrant and Ibrance with good tolerance.  Her most recent restaging scans occurred May 12, 2022 demonstrating interval improvement in previously demonstrated mediastinal and right hilar adenopathy, no change in dominant right axillary node, no change in dominant right lower lobe pulmonary nodule, no evidence of progressive metastatic disease in any other locations.  Scattered sclerotic osseous metastases consistent with treated metastases.   Audrey [name] is accompanied by her daughter today.  Audrey [name] suffered a fall and had some fractures in her pelvis and her daughter is concerned about the  length of time it is taking to have those fractures heal.  Audrey [name] notes that she was previously on bisphosphonate therapy and has declined to take further therapies.  She also has some skin lesions 1 is predominantly on her left forearm and she notes that it is warm and worsening and she has been putting antibiotic ointment on it which has not helped.   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  MEDICAL HISTORY: Past Medical History:  Diagnosis Date   Anxiety    Arthritis    "hands" (02/25/2014)   Borderline type 2 diabetes mellitus    Breast cancer, right breast (HCC) 1999   recurrence 08/2017   COVID    March 2021 and early 2022   GERD (gastroesophageal reflux disease)    High cholesterol    "can't take the RX; makes my bones ache" (02/25/2014)   History of hiatal hernia    History of stomach ulcers    Hypertension    Migraines    hx   Pneumonia    PONV (postoperative nausea and vomiting)    at last colonoscopy she was told she was hard to awaken   Shortness of breath dyspnea    w/ exertion     SURGICAL HISTORY: Past Surgical History:  Procedure Laterality Date   APPENDECTOMY     BREAST BIOPSY Right 1999   BREAST LUMPECTOMY Right  1999   BRONCHIAL BIOPSY  12/29/2020   Procedure: BRONCHIAL BIOPSIES;  Surgeon: Leslye Peer, MD;  Location: [facility] ENDOSCOPY;  Service: Pulmonary;;   BRONCHIAL BRUSHINGS  12/29/2020   Procedure: BRONCHIAL BRUSHINGS;  Surgeon: Leslye Peer, MD;  Location: [facility] ENDOSCOPY;  Service: Pulmonary;;   BRONCHIAL NEEDLE ASPIRATION BIOPSY  12/29/2020   Procedure: BRONCHIAL NEEDLE ASPIRATION BIOPSIES;  Surgeon: Leslye Peer, MD;   Location: [facility] ENDOSCOPY;  Service: Pulmonary;;   BRONCHIAL WASHINGS  12/29/2020   Procedure: BRONCHIAL WASHINGS;  Surgeon: Leslye Peer, MD;  Location: [facility] ENDOSCOPY;  Service: Pulmonary;;   DILATION AND CURETTAGE OF UTERUS     FINE NEEDLE ASPIRATION  12/29/2020   Procedure: FINE NEEDLE ASPIRATION (FNA) LINEAR;  Surgeon: Leslye Peer, MD;  Location: [facility] ENDOSCOPY;  Service: Pulmonary;;   PARTIAL KNEE ARTHROPLASTY Left 02/25/2014   Procedure: LEFT UNICOMPARTMENTAL (medial compartment) KNEE;  Surgeon: Dannielle Huh, MD;  Location: [facility] OR;  Service: Orthopedics;  Laterality: Left;   REPLACEMENT UNICONDYLAR JOINT KNEE Left 02/25/2014   TONSILLECTOMY     VAGINAL HYSTERECTOMY     VIDEO BRONCHOSCOPY WITH ENDOBRONCHIAL ULTRASOUND N/A 12/29/2020   Procedure: VIDEO BRONCHOSCOPY WITH ENDOBRONCHIAL ULTRASOUND;  Surgeon: Leslye Peer, MD;  Location: [facility] ENDOSCOPY;  Service: Pulmonary;  Laterality: N/A;   VIDEO BRONCHOSCOPY WITH RADIAL ENDOBRONCHIAL ULTRASOUND  12/29/2020   Procedure: RADIAL ENDOBRONCHIAL ULTRASOUND;  Surgeon: Leslye Peer, MD;  Location: [facility] ENDOSCOPY;  Service: Pulmonary;;    SOCIAL HISTORY: Social History   Socioeconomic History   Marital status: Widowed    Spouse name: Not on file   Number of children: Not on file   Years of education: Not on file   Highest education level: Not on file  Occupational History   Not on file  Tobacco Use   Smoking status: Some Days    Current packs/day: 0.00    Average packs/day: 0.7 packs/day for 10.0 years (7.5 ttl pk-yrs)    Types: Cigarettes    Start date: 11/05/2002    Last attempt to quit: 02/12/2011    Years since quitting: 11.5   Smokeless tobacco: Never   Tobacco comments:    Smokes 7 - 10 cigarettes a day [facility] 12/18/20  Substance and Sexual Activity   Alcohol use: No   Drug use: No   Sexual activity: Never  Other Topics Concern   Not on file  Social History Narrative   Not on file   Social Determinants of Health    Financial Resource Strain: Not on file  Food Insecurity: Not on file  Transportation Needs: Not on file  Physical Activity: Not on file  Stress: Not on file  Social Connections: Not on file  Intimate Partner Violence: Not on file    FAMILY HISTORY: History reviewed. No pertinent family history.  Review of Systems  Constitutional:  Negative for appetite change, chills, fatigue, fever and unexpected weight change.  HENT:   Negative for hearing loss, lump/mass and trouble swallowing.   Eyes:  Negative for eye problems and icterus.  Respiratory:  Negative for chest tightness, cough and shortness of breath.   Cardiovascular:  Negative for chest pain, leg swelling and palpitations.  Gastrointestinal:  Negative for abdominal distention, abdominal pain, constipation, diarrhea, nausea and vomiting.  Endocrine: Negative for hot flashes.  Genitourinary:  Negative for difficulty urinating.   Musculoskeletal:  Negative for arthralgias.  Skin:  Negative for itching and rash.  Neurological:  Negative for dizziness, extremity weakness, headaches and numbness.  Hematological:  Negative for adenopathy. Does not bruise/bleed easily.  Psychiatric/Behavioral:  Negative for depression. The patient is not nervous/anxious.       PHYSICAL EXAMINATION   Onc Performance Status - 09/03/22 1100       ECOG Perf Status   ECOG Perf Status Restricted in physically strenuous activity but ambulatory and able to carry out work of a light or sedentary nature, e.g., light house work, office work      KPS SCALE   KPS % SCORE Normal activity with effort, some s/s of disease             Vitals:   09/03/22 1128  BP: (!) 164/54  Pulse: (!) 110  Resp: 18  Temp: 98.4 F (36.9 C)  SpO2: 100%    Physical Exam Constitutional:      General: She is not in acute distress.    Appearance: Normal appearance. She is not toxic-appearing.  HENT:     Head: Normocephalic and atraumatic.     Mouth/Throat:      Mouth: Mucous membranes are moist.     Pharynx: Oropharynx is clear. No oropharyngeal exudate or posterior oropharyngeal erythema.  Eyes:     General: No scleral icterus. Cardiovascular:     Rate and Rhythm: Normal rate and regular rhythm.     Pulses: Normal pulses.     Heart sounds: Normal heart sounds.  Pulmonary:     Effort: Pulmonary effort is normal.     Breath sounds: Normal breath sounds.  Abdominal:     General: Abdomen is flat. Bowel sounds are normal. There is no distension.     Palpations: Abdomen is soft.     Tenderness: There is no abdominal tenderness.  Musculoskeletal:        General: No swelling.     Cervical back: Neck supple.  Lymphadenopathy:     Cervical: No cervical adenopathy.  Skin:    General: Skin is warm and dry.     Findings: No rash.     Comments: Raised erythematous nodule under the skin on the left forearm.  It is warm and slightly tender.  Slight fluctuance to the area.  Neurological:     General: No focal deficit present.     Mental Status: She is alert.  Psychiatric:        Mood and Affect: Mood normal.        Behavior: Behavior normal.     LABORATORY DATA:  CBC    Component Value Date/Time   WBC 5.5 09/03/2022 1053   WBC 5.4 06/17/2022 1805   RBC 2.97 (L) 09/03/2022 1053   HGB 10.9 (L) 09/03/2022 1053   HCT 29.9 (L) 09/03/2022 1053   PLT 239 09/03/2022 1053   MCV 100.7 (H) 09/03/2022 1053   MCH 36.7 (H) 09/03/2022 1053   MCHC 36.5 (H) 09/03/2022 1053   RDW 13.8 09/03/2022 1053   LYMPHSABS 2.6 09/03/2022 1053   MONOABS 0.2 09/03/2022 1053   EOSABS 0.0 09/03/2022 1053   BASOSABS 0.1 09/03/2022 1053    CMP     Component Value Date/Time   NA 138 09/03/2022 1053   K 4.3 09/03/2022 1053   CL 104 09/03/2022 1053   CO2 27 09/03/2022 1053   GLUCOSE 94 09/03/2022 1053   BUN 22 09/03/2022 1053   CREATININE 1.26 (H) 09/03/2022 1053   CALCIUM 10.2 09/03/2022 1053   PROT 7.4 09/03/2022 1053   ALBUMIN 4.6 09/03/2022 1053   AST 14  (L) 09/03/2022 1053   ALT  10 09/03/2022 1053   ALKPHOS 84 09/03/2022 1053   BILITOT 0.4 09/03/2022 1053   GFRNONAA 43 (L) 09/03/2022 1053   GFRAA >60 07/13/2019 1130       ASSESSMENT and THERAPY PLAN:   Malignant neoplasm of upper-outer quadrant of right breast in female, estrogen receptor positive (HCC) H/O right breast cancer on 2017 and 1999: Lumpectomy.  (Bilateral mastectomies in 2017: Seen by Dr. Rennis Harding, could not tolerate antiestrogen therapy) Chest CT on 06/06/19 showed a mass in the right axilla and a mass in the right lower lung lobe. PET scan on 07/03/19 showed a right lower lobe pulmonary nodule concerning for malignancy, the right axillary mass consistent with local breast cancer recurrence, and small hypermetabolic right axillary and neck nodes consistent with metastatic adenopathy. Biopsy of the right axilla on 07/13/19 showed carcinoma consistent with breast carcinoma, HER-2 negative (1+), ER+ 95%, PR+ 75%, Ki67 25%. ---------------------------------------------------------------------------------------------------------------------------------------------- Current treatment: Anastrozole 1 mg daily started 08/14/2019 with [name] (Verzinio was discontinued because of diarrhea), switched to Faslodex with [name] 01/20/2022 Ibrance toxicities:   1.  Diverticulitis: Intermittent diarrhea 2. no evidence of leukopenia. 3.  Fatigue: Currently on Ibrance 75 mg 3 weeks on 1 week off starting 05/25/2021.   Faslodex toxicities: Tolerating it extremely well    CT CAP 05/12/2022: Interval improvement in the previously demonstrated mediastinal and hilar lymphadenopathy, right axillary lymph node unchanged, no change in the right lower lobe pulmonary nodule, scattered bone metastases stable  Continue with monthly Faslodex. Bone metastasis: We discussed the role of bisphosphonate therapy--she has declined and continues to decline this. Skin lesion: Doxycycline 100 mg p.o. twice daily,  referral to Dr. Lucius Conn and general surgery to evaluate week of September 2 or the following week for biopsy.  Return to clinic every 4 weeks for labs and injection, she will see Dr. Pamelia Hoit in 8 weeks time.  Will repeat restaging CT chest abdomen pelvis before her next visit with Dr. Pamelia Hoit.  All questions were answered. The patient knows to call the clinic with any problems, questions or concerns. We can certainly see the patient much sooner if necessary.  Total encounter time:45 minutes*in face-to-face visit time, chart review, lab review, care coordination, order entry, and documentation of the encounter time.  Lillard Anes, NP 09/03/22 12:22 PM Medical Oncology and Hematology [facility] [address] [place], [place] [address] Tel. [phone]    Fax. [phone]  *Total Encounter Time as defined by the Centers for Medicare and Medicaid Services includes, in addition to the face-to-face time of a patient visit (documented in the note above) non-face-to-face time: obtaining and reviewing outside history, ordering and reviewing medications, tests or procedures, care coordination (communications with other health care professionals or caregivers) and documentation in the medical record.

## 2022-09-03 NOTE — Assessment & Plan Note (Addendum)
H/O right breast cancer on 2017 and 1999: Lumpectomy.  (Bilateral mastectomies in 2017: Seen by Dr. Rennis Harding, could not tolerate antiestrogen therapy) Chest CT on 06/06/19 showed a mass in the right axilla and a mass in the right lower lung lobe. PET scan on 07/03/19 showed a right lower lobe pulmonary nodule concerning for malignancy, the right axillary mass consistent with local breast cancer recurrence, and small hypermetabolic right axillary and neck nodes consistent with metastatic adenopathy. Biopsy of the right axilla on 07/13/19 showed carcinoma consistent with breast carcinoma, HER-2 negative (1+), ER+ 95%, PR+ 75%, Ki67 25%. ---------------------------------------------------------------------------------------------------------------------------------------------- Current treatment: Anastrozole 1 mg daily started 08/14/2019 with [name] (Verzinio was discontinued because of diarrhea), switched to Faslodex with [name] 01/20/2022 Ibrance toxicities:   1.  Diverticulitis: Intermittent diarrhea 2. no evidence of leukopenia. 3.  Fatigue: Currently on Ibrance 75 mg 3 weeks on 1 week off starting 05/25/2021.   Faslodex toxicities: Tolerating it extremely well    CT CAP 05/12/2022: Interval improvement in the previously demonstrated mediastinal and hilar lymphadenopathy, right axillary lymph node unchanged, no change in the right lower lobe pulmonary nodule, scattered bone metastases stable  Continue with monthly Faslodex. Bone metastasis: We discussed the role of bisphosphonate therapy--she has declined and continues to decline this. Skin lesion: Doxycycline 100 mg p.o. twice daily, referral to Dr. Lucius Conn and general surgery to evaluate week of September 2 or the following week for biopsy.  Return to clinic every 4 weeks for labs and injection, she will see Dr. Pamelia Hoit in 8 weeks time.  Will repeat restaging CT chest abdomen pelvis before her next visit with Dr. Pamelia Hoit.

## 2022-09-04 LAB — CANCER ANTIGEN 27.29: CA 27.29: 207.3 U/mL — ABNORMAL HIGH (ref 0.0–38.6)

## 2022-09-14 ENCOUNTER — Other Ambulatory Visit: Payer: Self-pay

## 2022-09-14 NOTE — Telephone Encounter (Signed)
No entry 

## 2022-09-15 ENCOUNTER — Other Ambulatory Visit: Payer: Self-pay | Admitting: *Deleted

## 2022-09-27 ENCOUNTER — Other Ambulatory Visit (HOSPITAL_COMMUNITY): Payer: Self-pay

## 2022-09-29 ENCOUNTER — Other Ambulatory Visit (HOSPITAL_COMMUNITY): Payer: Self-pay

## 2022-10-01 ENCOUNTER — Ambulatory Visit: Payer: Medicare Other | Admitting: Adult Health

## 2022-10-01 ENCOUNTER — Inpatient Hospital Stay: Payer: Medicare Other | Attending: Hematology and Oncology

## 2022-10-01 ENCOUNTER — Other Ambulatory Visit: Payer: Medicare Other

## 2022-10-01 VITALS — BP 159/57 | HR 90 | Temp 98.7°F | Resp 18

## 2022-10-01 DIAGNOSIS — C7951 Secondary malignant neoplasm of bone: Secondary | ICD-10-CM | POA: Insufficient documentation

## 2022-10-01 DIAGNOSIS — Z170 Estrogen receptor positive status [ER+]: Secondary | ICD-10-CM | POA: Insufficient documentation

## 2022-10-01 DIAGNOSIS — C50411 Malignant neoplasm of upper-outer quadrant of right female breast: Secondary | ICD-10-CM | POA: Diagnosis present

## 2022-10-01 DIAGNOSIS — F17210 Nicotine dependence, cigarettes, uncomplicated: Secondary | ICD-10-CM | POA: Insufficient documentation

## 2022-10-01 DIAGNOSIS — Z79899 Other long term (current) drug therapy: Secondary | ICD-10-CM | POA: Insufficient documentation

## 2022-10-01 LAB — CMP (CANCER CENTER ONLY)
ALT: 8 U/L (ref 0–44)
AST: 13 U/L — ABNORMAL LOW (ref 15–41)
Albumin: 4.3 g/dL (ref 3.5–5.0)
Alkaline Phosphatase: 85 U/L (ref 38–126)
Anion gap: 8 (ref 5–15)
BUN: 27 mg/dL — ABNORMAL HIGH (ref 8–23)
CO2: 25 mmol/L (ref 22–32)
Calcium: 9.5 mg/dL (ref 8.9–10.3)
Chloride: 107 mmol/L (ref 98–111)
Creatinine: 1.4 mg/dL — ABNORMAL HIGH (ref 0.44–1.00)
GFR, Estimated: 38 mL/min — ABNORMAL LOW (ref >60)
Glucose, Bld: 120 mg/dL — ABNORMAL HIGH (ref 70–99)
Potassium: 4.1 mmol/L (ref 3.5–5.1)
Sodium: 140 mmol/L (ref 135–145)
Total Bilirubin: 0.3 mg/dL (ref 0.3–1.2)
Total Protein: 6.9 g/dL (ref 6.5–8.1)

## 2022-10-01 LAB — CBC WITH DIFFERENTIAL (CANCER CENTER ONLY)
Abs Immature Granulocytes: 0.01 10*3/uL (ref 0.00–0.07)
Basophils Absolute: 0.1 10*3/uL (ref 0.0–0.1)
Basophils Relative: 1 %
Eosinophils Absolute: 0 10*3/uL (ref 0.0–0.5)
Eosinophils Relative: 1 %
HCT: 28.1 % — ABNORMAL LOW (ref 36.0–46.0)
Hemoglobin: 9.8 g/dL — ABNORMAL LOW (ref 12.0–15.0)
Immature Granulocytes: 0 %
Lymphocytes Relative: 44 %
Lymphs Abs: 1.9 10*3/uL (ref 0.7–4.0)
MCH: 35.8 pg — ABNORMAL HIGH (ref 26.0–34.0)
MCHC: 34.9 g/dL (ref 30.0–36.0)
MCV: 102.6 fL — ABNORMAL HIGH (ref 80.0–100.0)
Monocytes Absolute: 0.2 10*3/uL (ref 0.1–1.0)
Monocytes Relative: 5 %
Neutro Abs: 2.1 10*3/uL (ref 1.7–7.7)
Neutrophils Relative %: 49 %
Platelet Count: 222 10*3/uL (ref 150–400)
RBC: 2.74 MIL/uL — ABNORMAL LOW (ref 3.87–5.11)
RDW: 14.2 % (ref 11.5–15.5)
WBC Count: 4.3 10*3/uL (ref 4.0–10.5)
nRBC: 0 % (ref 0.0–0.2)

## 2022-10-01 MED ORDER — FULVESTRANT 250 MG/5ML IM SOSY
500.0000 mg | PREFILLED_SYRINGE | Freq: Once | INTRAMUSCULAR | Status: AC
  Administered 2022-10-01: 500 mg via INTRAMUSCULAR

## 2022-10-02 LAB — CANCER ANTIGEN 27.29: CA 27.29: 215.9 U/mL — ABNORMAL HIGH (ref 0.0–38.6)

## 2022-10-05 ENCOUNTER — Other Ambulatory Visit: Payer: Self-pay

## 2022-10-25 ENCOUNTER — Encounter: Payer: Self-pay | Admitting: Hematology and Oncology

## 2022-10-25 ENCOUNTER — Ambulatory Visit (HOSPITAL_COMMUNITY)
Admission: RE | Admit: 2022-10-25 | Discharge: 2022-10-25 | Disposition: A | Discharge status: Home / Self Care | Payer: Medicare Other | Source: Ambulatory Visit | Attending: Adult Health | Admitting: Adult Health

## 2022-10-25 DIAGNOSIS — C50411 Malignant neoplasm of upper-outer quadrant of right female breast: Secondary | ICD-10-CM | POA: Diagnosis present

## 2022-10-25 DIAGNOSIS — Z170 Estrogen receptor positive status [ER+]: Secondary | ICD-10-CM | POA: Diagnosis present

## 2022-10-25 DIAGNOSIS — L989 Disorder of the skin and subcutaneous tissue, unspecified: Secondary | ICD-10-CM | POA: Insufficient documentation

## 2022-10-25 LAB — POCT I-STAT CREATININE: Creatinine, Ser: 1.3 mg/dL — ABNORMAL HIGH (ref 0.44–1.00)

## 2022-10-25 MED ORDER — IOHEXOL 300 MG/ML  SOLN
80.0000 mL | Freq: Once | INTRAMUSCULAR | Status: AC | PRN
  Administered 2022-10-25: 80 mL via INTRAVENOUS

## 2022-10-25 MED ORDER — SODIUM CHLORIDE (PF) 0.9 % IJ SOLN
INTRAMUSCULAR | Status: AC
  Filled 2022-10-25: qty 50

## 2022-10-26 ENCOUNTER — Other Ambulatory Visit (HOSPITAL_COMMUNITY): Payer: Self-pay

## 2022-10-26 NOTE — Progress Notes (Signed)
Specialty Pharmacy Ongoing Clinical Assessment Note  Audrey [name] is a 82 y.o. female who is being followed by the specialty pharmacy service for RxSp Oncology   Patient's specialty medication(s) reviewed today: Palbociclib   Missed doses in the last 4 weeks: 0   Patient/Caregiver did not have any additional questions or concerns.   Therapeutic benefit summary: Patient is achieving benefit   Adverse events/side effects summary: Experienced adverse events/side effects (Diarrhea, unsure if it is from the [place], uses Imodium PRN, tolerable. Bone pain, tolerable.)   Patient's therapy is appropriate to: Continue    Goals Addressed             This Visit's Progress    Slow Disease Progression       Patient is on track. Patient will maintain adherence.  Most recent CT showed stable disease.         Follow up:  6 months  Servando Snare Specialty Pharmacist

## 2022-10-26 NOTE — Progress Notes (Signed)
Specialty Pharmacy Refill Coordination Note  Audrey [name] is a 82 y.o. female contacted today regarding refills of specialty medication(s) Palbociclib   Patient requested Delivery   Delivery date: 11/03/22   Verified address: [address] [place] [address]   Medication will be filled on 11/02/22.

## 2022-11-12 ENCOUNTER — Inpatient Hospital Stay: Payer: Medicare Other

## 2022-11-12 ENCOUNTER — Inpatient Hospital Stay: Payer: Medicare Other | Admitting: Hematology and Oncology

## 2022-11-12 ENCOUNTER — Inpatient Hospital Stay: Payer: Medicare Other | Attending: Hematology and Oncology

## 2022-11-12 ENCOUNTER — Other Ambulatory Visit: Payer: Self-pay

## 2022-11-12 VITALS — BP 152/55 | HR 90 | Temp 97.5°F | Resp 14 | Wt 131.3 lb

## 2022-11-12 VITALS — BP 140/69 | HR 92 | Temp 97.6°F | Resp 18

## 2022-11-12 DIAGNOSIS — Z9013 Acquired absence of bilateral breasts and nipples: Secondary | ICD-10-CM | POA: Diagnosis not present

## 2022-11-12 DIAGNOSIS — Z5982 Transportation insecurity: Secondary | ICD-10-CM | POA: Diagnosis not present

## 2022-11-12 DIAGNOSIS — Z886 Allergy status to analgesic agent status: Secondary | ICD-10-CM | POA: Diagnosis not present

## 2022-11-12 DIAGNOSIS — Z885 Allergy status to narcotic agent status: Secondary | ICD-10-CM | POA: Diagnosis not present

## 2022-11-12 DIAGNOSIS — C50411 Malignant neoplasm of upper-outer quadrant of right female breast: Secondary | ICD-10-CM

## 2022-11-12 DIAGNOSIS — C7800 Secondary malignant neoplasm of unspecified lung: Secondary | ICD-10-CM | POA: Insufficient documentation

## 2022-11-12 DIAGNOSIS — C7951 Secondary malignant neoplasm of bone: Secondary | ICD-10-CM | POA: Diagnosis not present

## 2022-11-12 DIAGNOSIS — Z170 Estrogen receptor positive status [ER+]: Secondary | ICD-10-CM | POA: Diagnosis not present

## 2022-11-12 DIAGNOSIS — Z880 Allergy status to penicillin: Secondary | ICD-10-CM | POA: Insufficient documentation

## 2022-11-12 DIAGNOSIS — R590 Localized enlarged lymph nodes: Secondary | ICD-10-CM | POA: Diagnosis not present

## 2022-11-12 DIAGNOSIS — R5383 Other fatigue: Secondary | ICD-10-CM | POA: Insufficient documentation

## 2022-11-12 DIAGNOSIS — Z1509 Genetic susceptibility to other malignant neoplasm: Secondary | ICD-10-CM | POA: Diagnosis not present

## 2022-11-12 DIAGNOSIS — Z79811 Long term (current) use of aromatase inhibitors: Secondary | ICD-10-CM | POA: Insufficient documentation

## 2022-11-12 DIAGNOSIS — R197 Diarrhea, unspecified: Secondary | ICD-10-CM | POA: Diagnosis not present

## 2022-11-12 DIAGNOSIS — Z79899 Other long term (current) drug therapy: Secondary | ICD-10-CM | POA: Diagnosis not present

## 2022-11-12 DIAGNOSIS — Z881 Allergy status to other antibiotic agents status: Secondary | ICD-10-CM | POA: Diagnosis not present

## 2022-11-12 DIAGNOSIS — Z1501 Genetic susceptibility to malignant neoplasm of breast: Secondary | ICD-10-CM | POA: Diagnosis not present

## 2022-11-12 LAB — CMP (CANCER CENTER ONLY)
ALT: 7 U/L (ref 0–44)
AST: 12 U/L — ABNORMAL LOW (ref 15–41)
Albumin: 4.3 g/dL (ref 3.5–5.0)
Alkaline Phosphatase: 104 U/L (ref 38–126)
Anion gap: 8 (ref 5–15)
BUN: 26 mg/dL — ABNORMAL HIGH (ref 8–23)
CO2: 27 mmol/L (ref 22–32)
Calcium: 10.1 mg/dL (ref 8.9–10.3)
Chloride: 104 mmol/L (ref 98–111)
Creatinine: 1.37 mg/dL — ABNORMAL HIGH (ref 0.44–1.00)
GFR, Estimated: 39 mL/min — ABNORMAL LOW (ref >60)
Glucose, Bld: 122 mg/dL — ABNORMAL HIGH (ref 70–99)
Potassium: 4.1 mmol/L (ref 3.5–5.1)
Sodium: 139 mmol/L (ref 135–145)
Total Bilirubin: 0.4 mg/dL (ref 0.3–1.2)
Total Protein: 7.1 g/dL (ref 6.5–8.1)

## 2022-11-12 LAB — CBC WITH DIFFERENTIAL (CANCER CENTER ONLY)
Abs Immature Granulocytes: 0.01 10*3/uL (ref 0.00–0.07)
Basophils Absolute: 0.1 10*3/uL (ref 0.0–0.1)
Basophils Relative: 1 %
Eosinophils Absolute: 0 10*3/uL (ref 0.0–0.5)
Eosinophils Relative: 0 %
HCT: 30.9 % — ABNORMAL LOW (ref 36.0–46.0)
Hemoglobin: 10.6 g/dL — ABNORMAL LOW (ref 12.0–15.0)
Immature Granulocytes: 0 %
Lymphocytes Relative: 24 %
Lymphs Abs: 1.3 10*3/uL (ref 0.7–4.0)
MCH: 35.2 pg — ABNORMAL HIGH (ref 26.0–34.0)
MCHC: 34.3 g/dL (ref 30.0–36.0)
MCV: 102.7 fL — ABNORMAL HIGH (ref 80.0–100.0)
Monocytes Absolute: 0.2 10*3/uL (ref 0.1–1.0)
Monocytes Relative: 3 %
Neutro Abs: 3.9 10*3/uL (ref 1.7–7.7)
Neutrophils Relative %: 72 %
Platelet Count: 329 10*3/uL (ref 150–400)
RBC: 3.01 MIL/uL — ABNORMAL LOW (ref 3.87–5.11)
RDW: 13.9 % (ref 11.5–15.5)
WBC Count: 5.5 10*3/uL (ref 4.0–10.5)
nRBC: 0 % (ref 0.0–0.2)

## 2022-11-12 MED ORDER — FULVESTRANT 250 MG/5ML IM SOSY
500.0000 mg | PREFILLED_SYRINGE | Freq: Once | INTRAMUSCULAR | Status: DC
Start: 2022-11-12 — End: 2022-11-12
  Filled 2022-11-12: qty 10

## 2022-11-12 NOTE — Progress Notes (Signed)
Patient Care Team: Maris Berger, MD as PCP - General (Family Medicine) Serena Croissant, MD as Consulting Physician (Hematology and Oncology)  DIAGNOSIS:  Encounter Diagnosis  Name Primary?   Malignant neoplasm of upper-outer quadrant of right breast in female, estrogen receptor positive (HCC) Yes    SUMMARY OF ONCOLOGIC HISTORY: Oncology History  Malignant neoplasm of upper-outer quadrant of right breast in female, estrogen receptor positive (HCC)  07/13/2019 Initial Diagnosis   History of right breast cancer on 2017. Chest CT on 06/06/19 showed a mass in the right axilla and a mass in the right lower lung lobe. PET scan on 07/03/19 showed a right lower lobe pulmonary nodule concerning for malignancy, the right axillary mass consistent with local breast cancer recurrence, and small hypermetabolic right axillary and neck nodes consistent with metastatic adenopathy. Biopsy of the right axilla on 07/13/19 showed carcinoma consistent with breast carcinoma, HER-2 negative (1+), ER+ 95%, PR+ 75%, Ki67 25%.   08/14/2019 -  Anti-estrogen oral therapy   Initially anastrozole alone,   Verzenio October 2022 (Verzenio discontinued for diarrhea), switched to [facility] 12/08/2020   09/03/2022 Cancer Staging   Staging form: Breast, AJCC 8th Edition - Clinical: Stage IV (pM1) - Signed by Loa Socks, NP on 09/03/2022     CHIEF COMPLIANT:  History of Present Illness   The patient, with a history of metastatic breast cancer, presents for a follow-up visit. She has been on medication for her cancer, the specifics of which are not mentioned in the transcript. The patient has been monitoring her condition with regular CT scans and tumor marker levels. The most recent CT scan shows a mixed picture with some lymph nodes under the arm appearing smaller, a nodule in the lung remaining relatively the same, and a new density observed in the T12 vertebral body. The patient's tumor marker, [id number], has been  gradually increasing over the last several months. 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     ALLERGIES:  is allergic to ativan [lorazepam], cefuroxime, aspirin, codeine, cyclosporine modified, and penicillins.  MEDICATIONS:  Current Outpatient Medications  Medication Sig Dispense Refill   acetaminophen (TYLENOL) 325 MG tablet Take 2 tablets (650 mg total) by mouth every 6 (six) hours as needed. 36 tablet 0   amLODipine (NORVASC) 2.5 MG tablet Take 2.5 mg by mouth daily.     cholecalciferol (VITAMIN D3) 25 MCG (1000 UNIT) tablet Take 1,000 Units by mouth daily.     cyclobenzaprine (FLEXERIL) 5 MG tablet Take 1 tablet (5 mg total) by mouth 3 (three) times daily as needed for muscle spasms. 30 tablet 3   doxycycline (VIBRA-TABS) 100 MG tablet Take 1 tablet (100 mg total) by mouth 2 (two) times daily. 14 tablet 0   ketotifen (ZADITOR) 0.025 % ophthalmic solution Place 1 drop into both eyes 2 (two) times daily as needed (dry eyes).     Multiple Vitamin (MULTIVITAMIN) capsule Take 1 capsule by mouth daily.     omeprazole (PRILOSEC) 20 MG capsule Take 20 mg by mouth daily as needed (acid reflux).     palbociclib (IBRANCE) 75 MG tablet Take 1 tablet (75 mg total) by mouth daily. Take for 21 days on, 7 days off, repeat every 28 days. 21 tablet 3   polyethylene glycol (MIRALAX / GLYCOLAX) 17 g packet Take 17 g by mouth daily as needed for moderate constipation.     No current facility-administered medications for this visit.  Facility-Administered Medications Ordered in Other Visits  Medication Dose Route Frequency Provider Last Rate Last Admin   fulvestrant (FASLODEX) injection 500 mg  500 mg Intramuscular Once Serena Croissant, MD        PHYSICAL EXAMINATION: ECOG PERFORMANCE STATUS: 1 - Symptomatic but completely ambulatory  Vitals:   11/12/22 1001  BP: (!) 152/55  Pulse: 90  Resp: 14  Temp: (!) 97.5 F (36.4 C)  SpO2:  100%   Filed Weights   11/12/22 1001  Weight: 131 lb 4.8 oz (59.6 kg)      LABORATORY DATA:  I have reviewed the data as listed    Latest Ref Rng & Units 11/12/2022    9:19 AM 10/25/2022   11:14 AM 10/01/2022   12:50 PM  CMP  Glucose 70 - 99 mg/dL 409   811   BUN 8 - 23 mg/dL 26   27   Creatinine 9.14 - 1.00 mg/dL 7.82  9.56  2.13   Sodium 135 - 145 mmol/L 139   140   Potassium 3.5 - 5.1 mmol/L 4.1   4.1   Chloride 98 - 111 mmol/L 104   107   CO2 22 - 32 mmol/L 27   25   Calcium 8.9 - 10.3 mg/dL 08.6   9.5   Total Protein 6.5 - 8.1 g/dL 7.1   6.9   Total Bilirubin 0.3 - 1.2 mg/dL 0.4   0.3   Alkaline Phos 38 - 126 U/L 104   85   AST 15 - 41 U/L 12   13   ALT 0 - 44 U/L 7   8     Lab Results  Component Value Date   WBC 5.5 11/12/2022   HGB 10.6 (L) 11/12/2022   HCT 30.9 (L) 11/12/2022   MCV 102.7 (H) 11/12/2022   PLT 329 11/12/2022   NEUTROABS 3.9 11/12/2022    ASSESSMENT & PLAN:  Malignant neoplasm of upper-outer quadrant of right breast in female, estrogen receptor positive (HCC) H/O right breast cancer on 2017 and 1999: Lumpectomy.  (Bilateral mastectomies in 2017: Seen by Dr. Rennis Harding, could not tolerate antiestrogen therapy) Chest CT on 06/06/19 showed a mass in the right axilla and a mass in the right lower lung lobe. PET scan on 07/03/19 showed a right lower lobe pulmonary nodule concerning for malignancy, the right axillary mass consistent with local breast cancer recurrence, and small hypermetabolic right axillary and neck nodes consistent with metastatic adenopathy. Biopsy of the right axilla on 07/13/19 showed carcinoma consistent with breast carcinoma, HER-2 negative (1+), ER+ 95%, PR+ 75%, Ki67 25%. ---------------------------------------------------------------------------------------------------------------------------------------------- Current treatment: Anastrozole 1 mg daily started 08/14/2019 with [name] (Verzinio was discontinued because of  diarrhea), switched to Faslodex with [name] 01/20/2022  Ibrance toxicities:   1.  Diverticulitis: Intermittent diarrhea 2. no evidence of leukopenia. 3.  Fatigue: Currently on Ibrance 75 mg 3 weeks on 1 week off starting 05/25/2021.   Faslodex toxicities: Tolerating it extremely well    CT CAP 05/12/2022: Interval improvement in the previously demonstrated mediastinal and hilar lymphadenopathy, right axillary lymph node unchanged, no change in the right lower lobe pulmonary nodule, scattered bone metastases stable CT CAP 11/01/2022: Mild response to therapy of right axillary lymph nodes, right lower lobe lung mass minimally increased in size from 4 cm to 4.1 cm, osseous metastases: New T12 suspicious for progressive disease  Counseling: With the progression of disease noted on the CT scans and the rise in the tumor marker CA 27-29 to 216,  recommend obtaining Guardant360 to determine if she has any mutations that we can target.   Continue with monthly Faslodex. Return to clinic in 1 month to discuss change in treatment.     General Health Maintenance / Followup Plans -Continue current treatment until results of Gardant 360 test are available. -Plan to discuss results and potential treatment changes in approximately 3 weeks.     No orders of the defined types were placed in this encounter.  The patient has a good understanding of the overall plan. she agrees with it. she will call with any problems that may develop before the next visit here. Total time spent: 30 mins including face to face time and time spent for planning, charting and co-ordination of care   Tamsen Meek, MD 11/12/22

## 2022-11-12 NOTE — Assessment & Plan Note (Signed)
H/O right breast cancer on 2017 and 1999: Lumpectomy.  (Bilateral mastectomies in 2017: Seen by Dr. Rennis Harding, could not tolerate antiestrogen therapy) Chest CT on 06/06/19 showed a mass in the right axilla and a mass in the right lower lung lobe. PET scan on 07/03/19 showed a right lower lobe pulmonary nodule concerning for malignancy, the right axillary mass consistent with local breast cancer recurrence, and small hypermetabolic right axillary and neck nodes consistent with metastatic adenopathy. Biopsy of the right axilla on 07/13/19 showed carcinoma consistent with breast carcinoma, HER-2 negative (1+), ER+ 95%, PR+ 75%, Ki67 25%. ---------------------------------------------------------------------------------------------------------------------------------------------- Current treatment: Anastrozole 1 mg daily started 08/14/2019 with [name] (Verzinio was discontinued because of diarrhea), switched to Faslodex with [name] 01/20/2022  Ibrance toxicities:   1.  Diverticulitis: Intermittent diarrhea 2. no evidence of leukopenia. 3.  Fatigue: Currently on Ibrance 75 mg 3 weeks on 1 week off starting 05/25/2021.   Faslodex toxicities: Tolerating it extremely well    CT CAP 05/12/2022: Interval improvement in the previously demonstrated mediastinal and hilar lymphadenopathy, right axillary lymph node unchanged, no change in the right lower lobe pulmonary nodule, scattered bone metastases stable CT CAP 11/01/2022: Mild response to therapy of right axillary lymph nodes, right lower lobe lung mass minimally increased in size from 4 cm to 4.1 cm, osseous metastases: New T12 suspicious for progressive disease  Counseling: With the progression of disease noted on the CT scans and the rise in the tumor marker CA 27-29 to 216, recommend obtaining Guardant360 to determine if she has any mutations that we can target.   Continue with monthly Faslodex. Return to clinic in 1 month to discuss change in  treatment.

## 2022-11-13 LAB — CANCER ANTIGEN 27.29: CA 27.29: 291.4 U/mL — ABNORMAL HIGH (ref 0.0–38.6)

## 2022-11-15 ENCOUNTER — Other Ambulatory Visit: Payer: Self-pay

## 2022-11-23 ENCOUNTER — Other Ambulatory Visit: Payer: Self-pay

## 2022-11-23 NOTE — Progress Notes (Signed)
Patient experienced progression on Ibrance. Awaiting results for pending therapy change. Paused patient per Florentina Addison.

## 2022-11-29 ENCOUNTER — Encounter: Payer: Self-pay | Admitting: Hematology and Oncology

## 2022-12-01 LAB — GUARDANT 360

## 2022-12-08 ENCOUNTER — Encounter: Payer: Self-pay | Admitting: *Deleted

## 2022-12-08 ENCOUNTER — Other Ambulatory Visit (HOSPITAL_COMMUNITY): Payer: Self-pay

## 2022-12-08 ENCOUNTER — Other Ambulatory Visit: Payer: Self-pay

## 2022-12-08 ENCOUNTER — Telehealth: Payer: Self-pay | Admitting: Pharmacy Technician

## 2022-12-08 ENCOUNTER — Other Ambulatory Visit: Payer: Self-pay | Admitting: Pharmacist

## 2022-12-08 ENCOUNTER — Inpatient Hospital Stay (HOSPITAL_BASED_OUTPATIENT_CLINIC_OR_DEPARTMENT_OTHER): Payer: Medicare Other | Admitting: Hematology and Oncology

## 2022-12-08 DIAGNOSIS — R739 Hyperglycemia, unspecified: Secondary | ICD-10-CM

## 2022-12-08 DIAGNOSIS — Z170 Estrogen receptor positive status [ER+]: Secondary | ICD-10-CM

## 2022-12-08 DIAGNOSIS — C50411 Malignant neoplasm of upper-outer quadrant of right female breast: Secondary | ICD-10-CM

## 2022-12-08 MED ORDER — CAPIVASERTIB 160 MG PO TBPK
320.0000 mg | ORAL_TABLET | Freq: Two times a day (BID) | ORAL | 3 refills | Status: DC
  Filled 2022-12-13 – 2022-12-15 (×4): qty 64, 28d supply, fill #0
  Filled 2023-01-07: qty 64, 28d supply, fill #1

## 2022-12-08 NOTE — Assessment & Plan Note (Signed)
H/O right breast cancer on 2017 and 1999: Lumpectomy.  (Bilateral mastectomies in 2017: Seen by Dr. Rennis Harding, could not tolerate antiestrogen therapy) Chest CT on 06/06/19 showed a mass in the right axilla and a mass in the right lower lung lobe. PET scan on 07/03/19 showed a right lower lobe pulmonary nodule concerning for malignancy, the right axillary mass consistent with local breast cancer recurrence, and small hypermetabolic right axillary and neck nodes consistent with metastatic adenopathy. Biopsy of the right axilla on 07/13/19 showed carcinoma consistent with breast carcinoma, HER-2 negative (1+), ER+ 95%, PR+ 75%, Ki67 25%. ---------------------------------------------------------------------------------------------------------------------------------------------- Current treatment: Anastrozole 1 mg daily started 08/14/2019 with [name] (Verzinio was discontinued because of diarrhea), switched to Faslodex with Ibrance 01/20/2022 discontinued 12/08/2022 due to progression  CT CAP 11/01/2022: Mild response to therapy of right axillary lymph nodes, right lower lobe lung mass minimally increased in size from 4 cm to 4.1 cm, osseous metastases: New T12 suspicious for progressive disease   Guardant360 11/19/2022: PIK 3 CA mutation, ESR 1 amplification high, BRCA2 loss, CDK N2 a loss, MTAP loss, RB1 by allelic loss detected  Treatment plan: Recommended treatment with Capivasertib with fulvestrant Capivasertib counseling: I discussed with the patient that Capivasertib is in a KT pathway inhibitor.  Phase 3 clinical trial revealed PFS 7.2 months versus 3.6 months.  Most frequent side effects included grade 3 rash 12%, diarrhea 9%, other effects including nausea fatigue vomiting headaches decreased appetite hyperglycemia stomatitis anemia who also been reported.   Return to clinic in 1 week for lab and follow-up with Jonny Ruiz

## 2022-12-08 NOTE — Telephone Encounter (Addendum)
Oral Oncology Patient Advocate Encounter  Prior Authorization for [place] has been approved.    PA# [id number] Effective dates: 12/08/22 through 01/11/24  Patients co-pay is $0. Patient has met their OOP max for 2024.   Patient is on the grant waitlist for additional funding for 2025.  Jinger Neighbors, CPhT-Adv Oncology Pharmacy Patient Advocate [facility] Direct Number: [phone]  Fax: [phone]

## 2022-12-08 NOTE — Progress Notes (Signed)
HEMATOLOGY-ONCOLOGY TELEPHONE VISIT PROGRESS NOTE  I connected with our patient on 12/08/22 at  1:15 PM EST by telephone and verified that I am speaking with the correct person using two identifiers.  I discussed the limitations, risks, security and privacy concerns of performing an evaluation and management service by telephone and the availability of in person appointments.  I also discussed with the patient that there may be a patient responsible charge related to this service. The patient expressed understanding and agreed to proceed.   History of Present Illness:  Discussed the use of AI scribe software for clinical note transcription with the patient, who gave verbal consent to proceed.  History of Present Illness   The patient, with a history of metastatic breast cancer, presents for a follow-up consultation. Guardant 360 was done and is connecting with me to discuss the result. She was found to have PIK3CA mutation and is being considered for Capivasertib. She also expresses concern about potential side effects, particularly rash and diarrhea, but is reassured that these are relatively rare. The patient has previously stopped taking Ibrance for her condition.        Oncology History  Malignant neoplasm of upper-outer quadrant of right breast in female, estrogen receptor positive (HCC)  07/13/2019 Initial Diagnosis   History of right breast cancer on 2017. Chest CT on 06/06/19 showed a mass in the right axilla and a mass in the right lower lung lobe. PET scan on 07/03/19 showed a right lower lobe pulmonary nodule concerning for malignancy, the right axillary mass consistent with local breast cancer recurrence, and small hypermetabolic right axillary and neck nodes consistent with metastatic adenopathy. Biopsy of the right axilla on 07/13/19 showed carcinoma consistent with breast carcinoma, HER-2 negative (1+), ER+ 95%, PR+ 75%, Ki67 25%.   08/14/2019 -  Anti-estrogen oral therapy   Initially  anastrozole alone,   Verzenio October 2022 (Verzenio discontinued for diarrhea), switched to [facility] 12/08/2020   09/03/2022 Cancer Staging   Staging form: Breast, AJCC 8th Edition - Clinical: Stage IV (pM1) - Signed by Loa Socks, NP on 09/03/2022     REVIEW OF SYSTEMS:   Constitutional: Denies fevers, chills or abnormal weight loss All other systems were reviewed with the patient and are negative. Observations/Objective:     Assessment Plan:  Malignant neoplasm of upper-outer quadrant of right breast in female, estrogen receptor positive (HCC) H/O right breast cancer on 2017 and 1999: Lumpectomy.  (Bilateral mastectomies in 2017: Seen by Dr. Rennis Harding, could not tolerate antiestrogen therapy) Chest CT on 06/06/19 showed a mass in the right axilla and a mass in the right lower lung lobe. PET scan on 07/03/19 showed a right lower lobe pulmonary nodule concerning for malignancy, the right axillary mass consistent with local breast cancer recurrence, and small hypermetabolic right axillary and neck nodes consistent with metastatic adenopathy. Biopsy of the right axilla on 07/13/19 showed carcinoma consistent with breast carcinoma, HER-2 negative (1+), ER+ 95%, PR+ 75%, Ki67 25%. ---------------------------------------------------------------------------------------------------------------------------------------------- Current treatment: Anastrozole 1 mg daily started 08/14/2019 with [name] (Verzinio was discontinued because of diarrhea), switched to Faslodex with Ibrance 01/20/2022 discontinued 12/08/2022 due to progression  CT CAP 11/01/2022: Mild response to therapy of right axillary lymph nodes, right lower lobe lung mass minimally increased in size from 4 cm to 4.1 cm, osseous metastases: New T12 suspicious for progressive disease   Guardant360 11/19/2022: PIK 3 CA mutation, ESR 1 amplification high, BRCA2 loss, CDK N2 a loss, MTAP loss, RB1 by allelic loss detected  Treatment  plan: Recommended treatment with Capivasertib with fulvestrant Capivasertib counseling: I discussed with the patient that Capivasertib is in a KT pathway inhibitor.  Phase 3 clinical trial revealed PFS 7.2 months versus 3.6 months.  Most frequent side effects included grade 3 rash 12%, diarrhea 9%, other effects including nausea fatigue vomiting headaches decreased appetite hyperglycemia stomatitis anemia who also been reported.   Return to clinic in 1 week  follow-up with Jonny Ruiz Patient has difficulty with transportation and would prefer to do as much of the counseling virtually as possible. We will try to do chemo education class with Jonny Ruiz virtually.      I discussed the assessment and treatment plan with the patient. The patient was provided an opportunity to ask questions and all were answered. The patient agreed with the plan and demonstrated an understanding of the instructions. The patient was advised to call back or seek an in-person evaluation if the symptoms worsen or if the condition fails to improve as anticipated.   I provided 12 minutes of non-face-to-face time during this encounter.  This includes time for charting and coordination of care   Tamsen Meek, MD

## 2022-12-08 NOTE — Telephone Encounter (Signed)
Oral Oncology Patient Advocate Encounter   Received notification that prior authorization for Truqap is required.   PA submitted on 12/08/22 Key [id number] Status is pending     Jinger Neighbors, CPhT-Adv Oncology Pharmacy Patient Advocate [facility] Direct Number: [phone]  Fax: [phone]

## 2022-12-10 ENCOUNTER — Inpatient Hospital Stay: Payer: Medicare Other

## 2022-12-10 ENCOUNTER — Other Ambulatory Visit: Payer: Medicare Other

## 2022-12-10 ENCOUNTER — Ambulatory Visit: Payer: Medicare Other | Admitting: Adult Health

## 2022-12-10 VITALS — BP 162/62 | HR 93 | Temp 97.4°F | Resp 16 | Wt 130.8 lb

## 2022-12-10 DIAGNOSIS — C50411 Malignant neoplasm of upper-outer quadrant of right female breast: Secondary | ICD-10-CM | POA: Diagnosis not present

## 2022-12-10 DIAGNOSIS — Z170 Estrogen receptor positive status [ER+]: Secondary | ICD-10-CM

## 2022-12-10 DIAGNOSIS — R739 Hyperglycemia, unspecified: Secondary | ICD-10-CM

## 2022-12-10 LAB — CBC WITH DIFFERENTIAL (CANCER CENTER ONLY)
Abs Immature Granulocytes: 0.05 10*3/uL (ref 0.00–0.07)
Basophils Absolute: 0.1 10*3/uL (ref 0.0–0.1)
Basophils Relative: 2 %
Eosinophils Absolute: 0 10*3/uL (ref 0.0–0.5)
Eosinophils Relative: 1 %
HCT: 30.7 % — ABNORMAL LOW (ref 36.0–46.0)
Hemoglobin: 10.4 g/dL — ABNORMAL LOW (ref 12.0–15.0)
Immature Granulocytes: 1 %
Lymphocytes Relative: 44 %
Lymphs Abs: 2.7 10*3/uL (ref 0.7–4.0)
MCH: 34.3 pg — ABNORMAL HIGH (ref 26.0–34.0)
MCHC: 33.9 g/dL (ref 30.0–36.0)
MCV: 101.3 fL — ABNORMAL HIGH (ref 80.0–100.0)
Monocytes Absolute: 0.6 10*3/uL (ref 0.1–1.0)
Monocytes Relative: 9 %
Neutro Abs: 2.5 10*3/uL (ref 1.7–7.7)
Neutrophils Relative %: 43 %
Platelet Count: 340 10*3/uL (ref 150–400)
RBC: 3.03 MIL/uL — ABNORMAL LOW (ref 3.87–5.11)
RDW: 14 % (ref 11.5–15.5)
WBC Count: 5.9 10*3/uL (ref 4.0–10.5)
nRBC: 0 % (ref 0.0–0.2)

## 2022-12-10 LAB — CMP (CANCER CENTER ONLY)
ALT: 17 U/L (ref 0–44)
AST: 20 U/L (ref 15–41)
Albumin: 4.4 g/dL (ref 3.5–5.0)
Alkaline Phosphatase: 99 U/L (ref 38–126)
Anion gap: 9 (ref 5–15)
BUN: 23 mg/dL (ref 8–23)
CO2: 26 mmol/L (ref 22–32)
Calcium: 10.1 mg/dL (ref 8.9–10.3)
Chloride: 102 mmol/L (ref 98–111)
Creatinine: 1.06 mg/dL — ABNORMAL HIGH (ref 0.44–1.00)
GFR, Estimated: 53 mL/min — ABNORMAL LOW (ref >60)
Glucose, Bld: 93 mg/dL (ref 70–99)
Potassium: 4.2 mmol/L (ref 3.5–5.1)
Sodium: 137 mmol/L (ref 135–145)
Total Bilirubin: 0.3 mg/dL (ref <1.2)
Total Protein: 7 g/dL (ref 6.5–8.1)

## 2022-12-10 LAB — HEMOGLOBIN A1C
Hgb A1c MFr Bld: 5.4 % (ref 4.8–5.6)
Mean Plasma Glucose: 108.28 mg/dL

## 2022-12-10 MED ORDER — FULVESTRANT 250 MG/5ML IM SOSY
500.0000 mg | PREFILLED_SYRINGE | Freq: Once | INTRAMUSCULAR | Status: AC
  Administered 2022-12-10: 500 mg via INTRAMUSCULAR
  Filled 2022-12-10: qty 10

## 2022-12-11 LAB — CANCER ANTIGEN 27.29: CA 27.29: 368.2 U/mL — ABNORMAL HIGH (ref 0.0–38.6)

## 2022-12-13 ENCOUNTER — Other Ambulatory Visit: Payer: Self-pay

## 2022-12-13 ENCOUNTER — Other Ambulatory Visit: Payer: Self-pay | Admitting: Pharmacy Technician

## 2022-12-13 NOTE — Progress Notes (Unsigned)
Specialty Pharmacy Initial Fill Coordination Note  Audrey [name] is a 82 y.o. female contacted today regarding refills of specialty medication(s) Capivasertib .  Patient requested Audrey [name] at [facility] at [place]  on 12/14/22   Medication will be filled on 12/13/22.   Patient is aware of $0 copayment.

## 2022-12-14 ENCOUNTER — Other Ambulatory Visit (HOSPITAL_COMMUNITY): Payer: Self-pay

## 2022-12-14 ENCOUNTER — Encounter: Payer: Self-pay | Admitting: Hematology and Oncology

## 2022-12-14 ENCOUNTER — Inpatient Hospital Stay: Payer: Medicare Other | Attending: Hematology and Oncology | Admitting: Pharmacist

## 2022-12-14 ENCOUNTER — Other Ambulatory Visit: Payer: Self-pay

## 2022-12-14 VITALS — BP 158/59 | HR 100 | Temp 98.0°F | Resp 18 | Ht 62.0 in | Wt 130.4 lb

## 2022-12-14 DIAGNOSIS — Z170 Estrogen receptor positive status [ER+]: Secondary | ICD-10-CM | POA: Insufficient documentation

## 2022-12-14 DIAGNOSIS — C50411 Malignant neoplasm of upper-outer quadrant of right female breast: Secondary | ICD-10-CM | POA: Insufficient documentation

## 2022-12-14 DIAGNOSIS — Z79899 Other long term (current) drug therapy: Secondary | ICD-10-CM | POA: Diagnosis not present

## 2022-12-14 NOTE — Progress Notes (Signed)
[facility]       Telephone: [phone]?Fax: [phone]   Oncology Clinical Pharmacist Practitioner Initial Assessment  Audrey [name] is a 82 y.o. female with a diagnosis of breast cancer. They were contacted today via in-person visit.  Indication/Regimen Capivasertib (Truqap) is being used appropriately for treatment of breast cancer by Dr. Serena Croissant.      Wt Readings from Last 1 Encounters:  12/10/22 130 lb 12.8 oz (59.3 kg)    Estimated body surface area is 1.61 meters squared as calculated from the following:   Height as of 09/03/22: 5\' 2"  (1.575 m).   Weight as of 12/10/22: 130 lb 12.8 oz (59.3 kg).  Baseline HbA1c: 5.4% (12/10/22) Baseline FBG: 93 mg/dL (85/46/27)  The dosing regimen is 400 mg by mouth every 12 hours for 4 consecutive days, followed by 3 days off in combination with fulvestrant. It is planned to continue until disease progression or unacceptable toxicity. Prescription dose and frequency assessed for appropriateness.  Patient has agreed to treatment which is documented in physician note on 12/08/22. Counseled patient on administration, dosing, side effects, monitoring, drug-food interactions, safe handling, storage, and disposal.  Baseline fasting labs done 12/10/22. She is next due to see Dr. Pamelia Hoit with labs on 01/07/23 which will be very close to the two week mark if she starts on 12/21/22. Ideally, she will receive her fulvestrant on Mondays after her 01/07/23 so her fasting labs can be reviewed and then she can start her new cycle of capivasertib on Tuesdays each week, with Saturday - Monday as her off days. Her BP was high today but asymptomatic. She does take BP meds at home.  Dose Modifications Per Dr. Pamelia Hoit, starting at a reduced dose of 320 mg BID 4 days on, followed by a 3 day rest period.   Access Assessment Audrey [name] will be receiving capivasertib through [facility] Concerns:  none Start date if known: tentatively 12/21/22  Adherence Assessment Reviewed importance on keeping a med schedule and plan for any missed doses Barriers to adherence identified? No  Allergies Allergies  Allergen Reactions   Ativan [Lorazepam] Itching   Cefuroxime Nausea And Vomiting   Aspirin Other (See Comments)    Stomach pain   Codeine Nausea And Vomiting   Cyclosporine Modified Other (See Comments)   Penicillins Rash    Vitals    12/14/2022    9:43 AM 12/14/2022    9:40 AM 12/10/2022   11:51 AM  Oncology Vitals  Height  158 cm   Weight  59.149 kg 59.33 kg  Weight (lbs)  130 lbs 6 oz 130 lbs 13 oz  BMI  23.85 kg/m2 23.92 kg/m2  Temp  98 F (36.7 C) 97.4 F (36.3 C)  Pulse Rate  100 93  BP 158/59 148/60 162/62  Resp  18 16  SpO2  100 % 99 %  BSA (m2)  1.61 m2 1.61 m2     Laboratory Data    Latest Ref Rng & Units 12/10/2022   12:27 PM 11/12/2022    9:19 AM 10/01/2022   12:50 PM  CBC EXTENDED  WBC 4.0 - 10.5 K/uL 5.9  5.5  4.3   RBC 3.87 - 5.11 MIL/uL 3.03  3.01  2.74   Hemoglobin 12.0 - 15.0 g/dL 03.5  00.9  9.8   HCT 38.1 - 46.0 % 30.7  30.9  28.1   Platelets 150 - 400 K/uL 340  329  222  NEUT# 1.7 - 7.7 K/uL 2.5  3.9  2.1   Lymph# 0.7 - 4.0 K/uL 2.7  1.3  1.9        Latest Ref Rng & Units 12/10/2022   12:27 PM 11/12/2022    9:19 AM 10/25/2022   11:14 AM  CMP  Glucose 70 - 99 mg/dL 93  329    BUN 8 - 23 mg/dL 23  26    Creatinine 5.18 - 1.00 mg/dL 8.41  6.60  6.30   Sodium 135 - 145 mmol/L 137  139    Potassium 3.5 - 5.1 mmol/L 4.2  4.1    Chloride 98 - 111 mmol/L 102  104    CO2 22 - 32 mmol/L 26  27    Calcium 8.9 - 10.3 mg/dL 16.0  10.9    Total Protein 6.5 - 8.1 g/dL 7.0  7.1    Total Bilirubin <1.2 mg/dL 0.3  0.4    Alkaline Phos 38 - 126 U/L 99  104    AST 15 - 41 U/L 20  12    ALT 0 - 44 U/L 17  7     Lab Results  Component Value Date   MG 1.9 02/05/2021   MG 1.8 01/08/2021   MG 2.0 12/11/2020   Lab Results  Component Value Date    CA2729 368.2 (H) 12/10/2022   CA2729 291.4 (H) 11/12/2022   CA2729 215.9 (H) 10/01/2022     Contraindications Contraindications were reviewed? Yes Contraindications to therapy were identified? No   Safety Precautions The following safety precautions for the use of capivasertib were reviewed:  Fever: reviewed the importance of having a thermometer and the Centers for Disease Control and Prevention (CDC) definition of fever which is 100.18F (38C) or higher. Patient should call 24/7 triage at [phone] if experiencing a fever or any other symptoms Hyperglycemia Diarrhea Cutaneous reactions Possible drug interactions Avoiding grapefruit products Nausea Stomatitis Fatigue Headache UTI Embryo-Fetal toxicity Lactation Dosing instructions review Storage and Handling  Medication Reconciliation Current Outpatient Medications  Medication Sig Dispense Refill   acetaminophen (TYLENOL) 325 MG tablet Take 2 tablets (650 mg total) by mouth every 6 (six) hours as needed. 36 tablet 0   amLODipine (NORVASC) 2.5 MG tablet Take 2.5 mg by mouth daily.     Capivasertib 160 MG TBPK Take 320 mg by mouth 2 (two) times daily. Four days on three days off 64 each 3   cholecalciferol (VITAMIN D3) 25 MCG (1000 UNIT) tablet Take 1,000 Units by mouth daily.     cyclobenzaprine (FLEXERIL) 5 MG tablet Take 1 tablet (5 mg total) by mouth 3 (three) times daily as needed for muscle spasms. 30 tablet 3   doxycycline (VIBRA-TABS) 100 MG tablet Take 1 tablet (100 mg total) by mouth 2 (two) times daily. 14 tablet 0   ketotifen (ZADITOR) 0.025 % ophthalmic solution Place 1 drop into both eyes 2 (two) times daily as needed (dry eyes).     Multiple Vitamin (MULTIVITAMIN) capsule Take 1 capsule by mouth daily.     omeprazole (PRILOSEC) 20 MG capsule Take 20 mg by mouth daily as needed (acid reflux).     polyethylene glycol (MIRALAX / GLYCOLAX) 17 g packet Take 17 g by mouth daily as needed for moderate  constipation.     No current facility-administered medications for this visit.    Medication reconciliation is based on the patient's most recent medication list in the electronic medical record (EMR) including herbal products and OTC  medications.   The patient's medication list was reviewed today with the patient? Yes   Drug-drug interactions (DDIs) DDIs were evaluated? Yes Significant DDIs identified? No   Drug-Food Interactions Drug-food interactions were evaluated? Yes Drug-food interactions identified?  Avoid grapefruit products  Follow-up Plan  Patient education handout given to patient Start capivasertib 320 mg BID 4 days on, followed by a 3 day rest period. She will start on 12/21/22 Discussed that she could start a second generation histamine antagonist (she prefers fexofenadine) to lower risk of cutaneous reactions She will bring in dosing of vitamin C and vitamin B12 at next visit. Added to med list Labs, Dr. Pamelia Hoit visit, and fulvestrant scheduled for 01/07/23. Should be fasting labs Dr. Pamelia Hoit may schedule patient with labs and clinical pharmacy as needed going forward. Ideally, she will receive her fulvestrant on Mondays after her 01/07/23 so her fasting labs can be reviewed and then she can start her new cycle of capivasertib, if parameters met,  on Tuesdays each week, with Saturday - Monday as her off days.  Isaac Laud participated in the discussion, expressed understanding, and voiced agreement with the above plan. All questions were answered to her satisfaction. The patient was advised to contact the clinic at [phone] with any questions or concerns prior to her return visit.   I spent 60 minutes assessing the patient.  Suhana Wilner A. Odetta Pink, PharmD, BCOP, CPP  Anselm Lis, RPH-CPP, 12/14/2022 9:02 AM  **Disclaimer: This note was dictated with voice recognition software. Similar sounding words can inadvertently be transcribed and this note may contain  transcription errors which may not have been corrected upon publication of note.**

## 2022-12-14 NOTE — Progress Notes (Signed)
Patient counseled in clinic visit note on 12/14/22

## 2022-12-15 ENCOUNTER — Other Ambulatory Visit (HOSPITAL_COMMUNITY): Payer: Self-pay

## 2022-12-15 ENCOUNTER — Other Ambulatory Visit (HOSPITAL_BASED_OUTPATIENT_CLINIC_OR_DEPARTMENT_OTHER): Payer: Self-pay

## 2022-12-15 ENCOUNTER — Other Ambulatory Visit: Payer: Self-pay

## 2022-12-15 NOTE — Progress Notes (Signed)
Patient Called & request medication to be Mail. Patient aware the lastest she may get the mediation would be Friday 12/6. Address verified.

## 2022-12-28 ENCOUNTER — Emergency Department (HOSPITAL_COMMUNITY): Payer: Medicare Other

## 2022-12-28 ENCOUNTER — Emergency Department (HOSPITAL_BASED_OUTPATIENT_CLINIC_OR_DEPARTMENT_OTHER)
Admit: 2022-12-28 | Discharge: 2022-12-28 | Disposition: A | Discharge status: Home / Self Care | Payer: Medicare Other | Attending: Emergency Medicine | Admitting: Emergency Medicine

## 2022-12-28 ENCOUNTER — Encounter (HOSPITAL_COMMUNITY): Payer: Self-pay

## 2022-12-28 ENCOUNTER — Emergency Department (HOSPITAL_COMMUNITY)
Admission: EM | Admit: 2022-12-28 | Discharge: 2022-12-28 | Disposition: A | Discharge status: Home / Self Care | Payer: Medicare Other | Attending: Emergency Medicine | Admitting: Emergency Medicine

## 2022-12-28 ENCOUNTER — Other Ambulatory Visit: Payer: Self-pay

## 2022-12-28 DIAGNOSIS — M79662 Pain in left lower leg: Secondary | ICD-10-CM | POA: Insufficient documentation

## 2022-12-28 DIAGNOSIS — Z853 Personal history of malignant neoplasm of breast: Secondary | ICD-10-CM | POA: Diagnosis not present

## 2022-12-28 DIAGNOSIS — M79604 Pain in right leg: Secondary | ICD-10-CM

## 2022-12-28 DIAGNOSIS — D72829 Elevated white blood cell count, unspecified: Secondary | ICD-10-CM | POA: Insufficient documentation

## 2022-12-28 DIAGNOSIS — C3490 Malignant neoplasm of unspecified part of unspecified bronchus or lung: Secondary | ICD-10-CM | POA: Insufficient documentation

## 2022-12-28 DIAGNOSIS — M79661 Pain in right lower leg: Secondary | ICD-10-CM | POA: Insufficient documentation

## 2022-12-28 DIAGNOSIS — Z79899 Other long term (current) drug therapy: Secondary | ICD-10-CM | POA: Insufficient documentation

## 2022-12-28 DIAGNOSIS — R944 Abnormal results of kidney function studies: Secondary | ICD-10-CM | POA: Insufficient documentation

## 2022-12-28 LAB — CBC WITH DIFFERENTIAL/PLATELET
Abs Immature Granulocytes: 0.13 10*3/uL — ABNORMAL HIGH (ref 0.00–0.07)
Basophils Absolute: 0.1 10*3/uL (ref 0.0–0.1)
Basophils Relative: 1 %
Eosinophils Absolute: 0.2 10*3/uL (ref 0.0–0.5)
Eosinophils Relative: 2 %
HCT: 32.1 % — ABNORMAL LOW (ref 36.0–46.0)
Hemoglobin: 11 g/dL — ABNORMAL LOW (ref 12.0–15.0)
Immature Granulocytes: 1 %
Lymphocytes Relative: 29 %
Lymphs Abs: 3.3 10*3/uL (ref 0.7–4.0)
MCH: 34 pg (ref 26.0–34.0)
MCHC: 34.3 g/dL (ref 30.0–36.0)
MCV: 99.1 fL (ref 80.0–100.0)
Monocytes Absolute: 0.9 10*3/uL (ref 0.1–1.0)
Monocytes Relative: 8 %
Neutro Abs: 6.8 10*3/uL (ref 1.7–7.7)
Neutrophils Relative %: 59 %
Platelets: 342 10*3/uL (ref 150–400)
RBC: 3.24 MIL/uL — ABNORMAL LOW (ref 3.87–5.11)
RDW: 13.7 % (ref 11.5–15.5)
WBC: 11.4 10*3/uL — ABNORMAL HIGH (ref 4.0–10.5)
nRBC: 0 % (ref 0.0–0.2)

## 2022-12-28 LAB — COMPREHENSIVE METABOLIC PANEL
ALT: 15 U/L (ref 0–44)
AST: 23 U/L (ref 15–41)
Albumin: 4.1 g/dL (ref 3.5–5.0)
Alkaline Phosphatase: 101 U/L (ref 38–126)
Anion gap: 12 (ref 5–15)
BUN: 29 mg/dL — ABNORMAL HIGH (ref 8–23)
CO2: 22 mmol/L (ref 22–32)
Calcium: 10.2 mg/dL (ref 8.9–10.3)
Chloride: 99 mmol/L (ref 98–111)
Creatinine, Ser: 1.44 mg/dL — ABNORMAL HIGH (ref 0.44–1.00)
GFR, Estimated: 37 mL/min — ABNORMAL LOW (ref >60)
Glucose, Bld: 95 mg/dL (ref 70–99)
Potassium: 4.2 mmol/L (ref 3.5–5.1)
Sodium: 133 mmol/L — ABNORMAL LOW (ref 135–145)
Total Bilirubin: 0.7 mg/dL (ref <1.2)
Total Protein: 7.2 g/dL (ref 6.5–8.1)

## 2022-12-28 LAB — CK: Total CK: 81 U/L (ref 38–234)

## 2022-12-28 MED ORDER — DOCUSATE SODIUM 100 MG PO CAPS: 100.0000 mg | ORAL_CAPSULE | Freq: Two times a day (BID) | ORAL | 0 refills | Status: DC

## 2022-12-28 MED ORDER — ONDANSETRON HCL 4 MG/2ML IJ SOLN
4.0000 mg | Freq: Once | INTRAMUSCULAR | Status: AC
  Administered 2022-12-28: 4 mg via INTRAVENOUS
  Filled 2022-12-28: qty 2

## 2022-12-28 MED ORDER — ONDANSETRON 4 MG PO TBDP: 4.0000 mg | ORAL_TABLET | Freq: Three times a day (TID) | ORAL | 0 refills | Status: DC | PRN

## 2022-12-28 MED ORDER — HYDROMORPHONE HCL 2 MG PO TABS: 2.0000 mg | ORAL_TABLET | Freq: Four times a day (QID) | ORAL | 0 refills | Status: DC | PRN

## 2022-12-28 MED ORDER — HYDROMORPHONE HCL 1 MG/ML IJ SOLN
0.5000 mg | Freq: Once | INTRAMUSCULAR | Status: AC
Start: 2022-12-28 — End: 2022-12-28
  Administered 2022-12-28: 0.5 mg via INTRAVENOUS
  Filled 2022-12-28: qty 1

## 2022-12-28 NOTE — Progress Notes (Signed)
Bilateral lower extremity venous duplex has been completed. Preliminary results can be found in CV Proc through chart review.  Results were given to Renne Crigler PA.  12/28/22 12:46 PM Olen Cordial RVT

## 2022-12-28 NOTE — ED Triage Notes (Signed)
Patient has had bilateral leg pain since Sunday. No falls. Feels achy. Walks with cane.

## 2022-12-28 NOTE — Discharge Instructions (Addendum)
Please read and follow all provided instructions.  Your diagnoses today include:  1. Pain in both lower extremities   2. Primary malignant neoplasm of lung metastatic to other site, unspecified laterality (HCC)     Tests performed today include: Blood cell counts and electrolytes Blood test for muscle breakdown was normal Ultrasound of the lower extremities did not show signs of blood clot CT scan of the lumbar spine and pelvis shows healing pelvic fractures, previously known T12 compression fracture, 2 new possible new areas of cancer spread to the bone in the lumbar spine Vital signs. See below for your results today.   Medications prescribed:  Dilaudid - narcotic pain medication  DO NOT drive or perform any activities that require you to be awake and alert because this medicine can make you drowsy.  Zofran (ondansetron) - for nausea and vomiting  Colace - stool softener  This medication can be found over-the-counter.   Continue colace for 2 weeks after your stools return to normal to prevent constipation.   Take any prescribed medications only as directed.  Home care instructions:  Follow any educational materials contained in this packet.  BE VERY CAREFUL not to take multiple medicines containing Tylenol (also called acetaminophen). Doing so can lead to an overdose which can damage your liver and cause liver failure and possibly death.   Follow-up instructions: Please follow-up with your primary care provider or oncologist in the next 2-3 days for further evaluation of your symptoms.   Return instructions:  Please return to the Emergency Department if you experience worsening symptoms.  Return if you lose control of your bowels or bladder, cannot walk Please return if you have any other emergent concerns.  Additional Information:  Your vital signs today were: BP (!) 145/64   Pulse 94   Temp 97.8 F (36.6 C) (Oral)   Resp 18   Ht 5' 2.5" (1.588 m)   Wt 59.4 kg    SpO2 100%   BMI 23.58 kg/m  If your blood pressure (BP) was elevated above 135/85 this visit, please have this repeated by your doctor within one month. --------------

## 2022-12-28 NOTE — ED Provider Notes (Signed)
[name] Provider Note   CSN: [id number] Arrival date & time: 12/28/22  1130     History  Chief Complaint  Patient presents with   Leg Pain    Audrey [name] is a 82 y.o. female.  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  Patient denies any falls or injuries.  She reports that the pain feels similar to when she fell and broke her pelvis earlier in the year, but reports no new trauma.  It has been very hard for her to bear weight due to the pain in her legs with weightbearing.  She has been using heating pads.  She took several doses of home oxycodone, that she only takes as needed for pain.  Patient was started on a new oral chemotherapy agent about 3 weeks ago called Capivasertib.  Patient denies fevers, nausea or vomiting.  She has been constipated for the pain medication.  She is taking MiraLAX for that.  No distal numbness, tingling, or weakness in her legs.  No swelling of the legs.  She is not on anticoagulation at current time.       Home Medications Prior to Admission medications   Medication Sig Start Date End Date Taking? Authorizing Provider  acetaminophen (TYLENOL) 325 MG tablet Take 2 tablets (650 mg total) by mouth every 6 (six) hours as needed. 06/17/22   Sloan Leiter, DO  amLODipine (NORVASC) 2.5 MG tablet Take 2.5 mg by mouth daily. 05/16/19   [provider]  ascorbic acid (VITAMIN C) 500 MG tablet Take by mouth daily.    [provider]  Capivasertib 160 MG TBPK Take 320 mg by mouth 2 (two) times daily. Four days on three days off Patient not taking: Reported on 12/14/2022 12/08/22   Serena Croissant, MD  cholecalciferol (VITAMIN D3) 25 MCG (1000 UNIT) tablet Take 1,000 Units by mouth daily.    [provider]  cyclobenzaprine  (FLEXERIL) 5 MG tablet Take 1 tablet (5 mg total) by mouth 3 (three) times daily as needed for muscle spasms. 04/16/22   Serena Croissant, MD  ketotifen (ZADITOR) 0.025 % ophthalmic solution Place 1 drop into both eyes 2 (two) times daily as needed (dry eyes).    [provider]  Multiple Vitamin (MULTIVITAMIN) capsule Take 1 capsule by mouth daily. 09/03/15   [provider]  omeprazole (PRILOSEC) 20 MG capsule Take 20 mg by mouth daily as needed (acid reflux).    [provider]  polyethylene glycol (MIRALAX / GLYCOLAX) 17 g packet Take 17 g by mouth daily as needed for moderate constipation.    [provider]  vitamin B-12 (CYANOCOBALAMIN) 100 MCG tablet Take 100 mcg by mouth daily.    [provider]      Allergies    Ativan [lorazepam], Cefuroxime, Aspirin, Codeine, Cyclosporine modified, and Penicillins    Review of Systems   Review of Systems  Physical Exam Updated Vital Signs BP (!) 145/64   Pulse 94   Temp 97.8 F (36.6 C) (Oral)   Resp 18   Ht 5' 2.5" (1.588 m)   Wt 59.4 kg   SpO2 100%   BMI 23.58 kg/m  Physical Exam Vitals and nursing note reviewed.  Constitutional:      General: She is not in acute distress.    Appearance:  She is well-developed.  HENT:     Head: Normocephalic and atraumatic.     Right Ear: External ear normal.     Left Ear: External ear normal.     Nose: Nose normal.  Eyes:     Conjunctiva/sclera: Conjunctivae normal.  Cardiovascular:     Rate and Rhythm: Normal rate and regular rhythm.     Heart sounds: No murmur heard. Pulmonary:     Effort: No respiratory distress.     Breath sounds: No wheezing, rhonchi or rales.  Abdominal:     Palpations: Abdomen is soft.     Tenderness: There is no abdominal tenderness. There is no guarding or rebound.  Musculoskeletal:     Cervical back: Normal range of motion and neck supple.     Right hip: No tenderness. Normal range of motion.     Left hip: No tenderness.  Normal range of motion.     Right upper leg: Tenderness present. No swelling or edema.     Left upper leg: Tenderness present. No swelling or edema.     Right knee: No effusion. Normal range of motion. No tenderness.     Left knee: No effusion. Normal range of motion. No tenderness.     Right lower leg: Tenderness present. No edema.     Left lower leg: Tenderness present. No edema.     Right ankle: No tenderness. Normal range of motion.     Left ankle: No tenderness. Normal range of motion.  Skin:    General: Skin is warm and dry.     Findings: No rash.  Neurological:     General: No focal deficit present.     Mental Status: She is alert. Mental status is at baseline.     Motor: No weakness.  Psychiatric:        Mood and Affect: Mood normal.     ED Results / Procedures / Treatments   Labs (all labs ordered are listed, but only abnormal results are displayed) Labs Reviewed  CBC WITH DIFFERENTIAL/PLATELET - Abnormal; Notable for the following components:      Result Value   WBC 11.4 (*)    RBC 3.24 (*)    Hemoglobin 11.0 (*)    HCT 32.1 (*)    Abs Immature Granulocytes 0.13 (*)    All other components within normal limits  COMPREHENSIVE METABOLIC PANEL - Abnormal; Notable for the following components:   Sodium 133 (*)    BUN 29 (*)    Creatinine, Ser 1.44 (*)    GFR, Estimated 37 (*)    All other components within normal limits  CK    EKG None  Radiology VAS [place] LOWER EXTREMITY VENOUS (DVT) (ONLY [facility] & [facility]) Result Date: 12/28/2022  Lower Venous DVT Study Patient Name:  Audrey [name]  Date of Exam:   12/28/2022 Medical Rec #: [id number]         Accession #:    [id number] Date of Birth: 1940-05-15         Patient Gender: F Patient Age:   33 years Exam Location:  [facility] Procedure:      VAS [place] LOWER EXTREMITY VENOUS (DVT) Referring Phys: Ivin Booty Fleda Pagel --------------------------------------------------------------------------------  Indications: Pain.  Risk  Factors: Cancer. Comparison Study: No prior studies. Performing Technologist: Chanda Busing RVT  Examination Guidelines: A complete evaluation includes B-mode imaging, spectral Doppler, color Doppler, and power Doppler as needed of all accessible portions of each vessel. Bilateral testing is considered an integral  part of a complete examination. Limited examinations for reoccurring indications may be performed as noted. The reflux portion of the exam is performed with the patient in reverse Trendelenburg.  +---------+---------------+---------+-----------+----------+--------------+ RIGHT    CompressibilityPhasicitySpontaneityPropertiesThrombus Aging +---------+---------------+---------+-----------+----------+--------------+ CFV      Full           Yes      Yes                                 +---------+---------------+---------+-----------+----------+--------------+ SFJ      Full                                                        +---------+---------------+---------+-----------+----------+--------------+ FV Prox  Full                                                        +---------+---------------+---------+-----------+----------+--------------+ FV Mid   Full                                                        +---------+---------------+---------+-----------+----------+--------------+ FV DistalFull                                                        +---------+---------------+---------+-----------+----------+--------------+ PFV      Full                                                        +---------+---------------+---------+-----------+----------+--------------+ POP      Full           Yes      Yes                                 +---------+---------------+---------+-----------+----------+--------------+ PTV      Full                                                         +---------+---------------+---------+-----------+----------+--------------+ PERO     Full                                                        +---------+---------------+---------+-----------+----------+--------------+   +---------+---------------+---------+-----------+----------+--------------+ LEFT     CompressibilityPhasicitySpontaneityPropertiesThrombus Aging +---------+---------------+---------+-----------+----------+--------------+ CFV  Full           Yes      Yes                                 +---------+---------------+---------+-----------+----------+--------------+ SFJ      Full                                                        +---------+---------------+---------+-----------+----------+--------------+ FV Prox  Full                                                        +---------+---------------+---------+-----------+----------+--------------+ FV Mid   Full                                                        +---------+---------------+---------+-----------+----------+--------------+ FV DistalFull                                                        +---------+---------------+---------+-----------+----------+--------------+ PFV      Full                                                        +---------+---------------+---------+-----------+----------+--------------+ POP      Full           Yes      Yes                                 +---------+---------------+---------+-----------+----------+--------------+ PTV      Full                                                        +---------+---------------+---------+-----------+----------+--------------+ PERO     Full                                                        +---------+---------------+---------+-----------+----------+--------------+     Summary: RIGHT: - There is no evidence of deep vein thrombosis in the lower extremity.  - No cystic structure found in  the popliteal fossa.  LEFT: - There is no evidence of deep vein thrombosis in the lower extremity.  - No cystic structure found in the popliteal fossa.  *See table(s) above for measurements and  observations. Electronically signed by Lemar Livings MD on 12/28/2022 at 1:40:08 PM.    Final     Procedures Procedures    Medications Ordered in ED Medications  HYDROmorphone (DILAUDID) injection 0.5 mg (has no administration in time range)  ondansetron (ZOFRAN) injection 4 mg (has no administration in time range)    ED Course/ Medical Decision Making/ A&P    Patient seen and examined. History obtained directly from patient.  Patient with history of active breast cancer with bony metastasis.  She denies any focal or acute back pains.  Pains are mainly in the upper legs but then shoots bilaterally into the lower legs.  No weakness.  No fevers.  No urinary retention or bowel incontinence.  Labs/EKG: Ordered CBC, BMP, CK  Imaging: Ordered lower extremity venous ultrasounds.  Medications/Fluids: Ordered: Dilaudid, Zofran, bolus.  Patient is very uncomfortable.  Most recent vital signs reviewed and are as follows: BP (!) 145/64   Pulse 94   Temp 97.8 F (36.6 C) (Oral)   Resp 18   Ht 5' 2.5" (1.588 m)   Wt 59.4 kg   SpO2 100%   BMI 23.58 kg/m   Initial impression: lower leg pain, patient with significant risk factors due to her oncologic status.  Will ensure no DVT although agree that presentation is not typical for acute bilateral DVT.  Will ensure that CK is normal.  Will attempt to control pain.  12:54 PM Discussed history with Dr. Renaye Rakers and discussed appropriate imaging. US performed and reported negative bilaterally.   2:00 PM Reassessment performed. Patient appears improved, pain is better.   Labs personally reviewed and interpreted including: CMP mild elevation in creatinine, electrolytes okay; CK normal  Imaging results reviewed: [place] negative, CT done, results pending.    Reviewed pertinent lab work and imaging with patient at bedside. Questions answered.   Most current vital signs reviewed and are as follows: BP (!) 145/64   Pulse 94   Temp 97.8 F (36.6 C) (Oral)   Resp 18   Ht 5' 2.5" (1.588 m)   Wt 59.4 kg   SpO2 100%   BMI 23.58 kg/m   Plan: Complete work-up.   3:30 PM Reassessment performed. Patient appears stable, comfortable.  Labs personally reviewed and interpreted including: CBC with minimally elevated white blood cell count 11.4, hemoglobin slightly low at 11; CMP mildly elevated creatinine at 1.44 with a BUN of 29, sodium 133; CK normal.  Imaging personally visualized and interpreted including: CT scan of the lumbar spine and pelvis, agree with fractures as noted.  Reviewed pertinent lab work and imaging with patient at bedside. Questions answered.   Most current vital signs reviewed and are as follows: BP (!) 145/64   Pulse 94   Temp 97.8 F (36.6 C) (Oral)   Resp 18   Ht 5' 2.5" (1.588 m)   Wt 59.4 kg   SpO2 100%   BMI 23.58 kg/m   Plan: Discharge to home.   Prescriptions written for: Hydromorphone tablets 2 mg, Zofran, Colace  Other home care instructions discussed: Scheduled medication for pain control over the next day or 2 and then wean back, maintain good bowel regimen  ED return instructions discussed: Inability to walk, new or worsening symptoms  Follow-up instructions discussed: Patient encouraged to follow-up with their oncologist in the next couple of days for recommendations  Medical Decision Making Amount and/or Complexity of Data Reviewed Labs: ordered. Radiology: ordered.  Risk OTC drugs. Prescription drug management.   Patient with bilateral lower extremity pain in setting of ongoing active metastatic breast cancer widely metastatic to the bone including the lower back and legs.  Overall symptoms are not suggestive of cauda equina.  Lower extremity DVT studies  performed, negative.  CT of the L-spine and pelvis negative for acute fractures.  Patient does have a pathologic compression fracture which appears stable.  She has 2 areas on the L-spine which may be new areas of metastasis.  Patient is not point tender over the lower spine.  Do not feel that she requires advanced imaging with MRI at this time.  Lower extremities are well-perfused.  No signs of arterial insufficiency.  I think that the most likely cause of her current symptoms is pain related to her bony metastasis.  Fortunately she did get relief in the ED with IV hydromorphone.  She is not opioid tolerant.  She will be given a short course of 2 mg hydromorphone tablets and encouraged to maintain a good bowel regimen.  She has appropriate outpatient follow-up.  She is comfortable discharged home at this time.        Final Clinical Impression(s) / ED Diagnoses Final diagnoses:  Pain in both lower extremities  Primary malignant neoplasm of lung metastatic to other site, unspecified laterality [facility])    Rx / DC Orders ED Discharge Orders          Ordered    HYDROmorphone (DILAUDID) 2 MG tablet  Every 6 hours PRN        12/28/22 1525    docusate sodium (COLACE) 100 MG capsule  Every 12 hours        12/28/22 1525    ondansetron (ZOFRAN-ODT) 4 MG disintegrating tablet  Every 8 hours PRN        12/28/22 1525              Renne Crigler, PA-C 12/28/22 1838    Terald Sleeper, MD 12/29/22 [id number]

## 2022-12-30 ENCOUNTER — Inpatient Hospital Stay: Payer: Medicare Other | Admitting: Hematology and Oncology

## 2022-12-30 VITALS — BP 147/50 | HR 94 | Temp 97.9°F | Resp 18 | Ht 62.5 in | Wt 126.9 lb

## 2022-12-30 DIAGNOSIS — Z170 Estrogen receptor positive status [ER+]: Secondary | ICD-10-CM | POA: Diagnosis not present

## 2022-12-30 DIAGNOSIS — C50411 Malignant neoplasm of upper-outer quadrant of right female breast: Secondary | ICD-10-CM

## 2022-12-30 MED ORDER — MAGNESIUM OXIDE -MG SUPPLEMENT 400 (240 MG) MG PO TABS: 400.0000 mg | ORAL_TABLET | Freq: Every day | ORAL | 6 refills | Status: DC

## 2022-12-30 MED ORDER — ONDANSETRON 4 MG PO TBDP: 4.0000 mg | ORAL_TABLET | Freq: Three times a day (TID) | ORAL | 3 refills | Status: DC | PRN

## 2022-12-30 MED ORDER — HYDROCODONE-ACETAMINOPHEN 5-325 MG PO TABS: 1.0000 | ORAL_TABLET | Freq: Four times a day (QID) | ORAL | 0 refills | Status: DC | PRN

## 2022-12-30 NOTE — Assessment & Plan Note (Signed)
H/O right breast cancer on 2017 and 1999: Lumpectomy.  (Bilateral mastectomies in 2017: Seen by Dr. Rennis Harding, could not tolerate antiestrogen therapy) Chest CT on 06/06/19 showed a mass in the right axilla and a mass in the right lower lung lobe. PET scan on 07/03/19 showed a right lower lobe pulmonary nodule concerning for malignancy, the right axillary mass consistent with local breast cancer recurrence, and small hypermetabolic right axillary and neck nodes consistent with metastatic adenopathy. Biopsy of the right axilla on 07/13/19 showed carcinoma consistent with breast carcinoma, HER-2 negative (1+), ER+ 95%, PR+ 75%, Ki67 25%. ---------------------------------------------------------------------------------------------------------------------------------------------- Current treatment: Anastrozole 1 mg daily started 08/14/2019 with [name] (Verzinio was discontinued because of diarrhea), switched to Faslodex with Ibrance 01/20/2022 discontinued 12/08/2022 due to progression   CT CAP 11/01/2022: Mild response to therapy of right axillary lymph nodes, right lower lobe lung mass minimally increased in size from 4 cm to 4.1 cm, osseous metastases: New T12 suspicious for progressive disease    [employer] 11/19/2022: PIK 3 CA mutation, ESR 1 amplification high, BRCA2 loss, CDK N2 a loss, MTAP loss, 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

## 2022-12-30 NOTE — Progress Notes (Signed)
Patient Care Team: Maris Berger, MD as PCP - General (Family Medicine) Serena Croissant, MD as Consulting Physician (Hematology and Oncology)  DIAGNOSIS:  Encounter Diagnosis  Name Primary?   Malignant neoplasm of upper-outer quadrant of right breast in female, estrogen receptor positive (HCC) Yes    SUMMARY OF ONCOLOGIC HISTORY: Oncology History  Malignant neoplasm of upper-outer quadrant of right breast in female, estrogen receptor positive (HCC)  07/13/2019 Initial Diagnosis   History of right breast cancer on 2017. Chest CT on 06/06/19 showed a mass in the right axilla and a mass in the right lower lung lobe. PET scan on 07/03/19 showed a right lower lobe pulmonary nodule concerning for malignancy, the right axillary mass consistent with local breast cancer recurrence, and small hypermetabolic right axillary and neck nodes consistent with metastatic adenopathy. Biopsy of the right axilla on 07/13/19 showed carcinoma consistent with breast carcinoma, HER-2 negative (1+), ER+ 95%, PR+ 75%, Ki67 25%.   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 The pain is not further characterized in terms of onset, duration, or exacerbating or relieving factors. The patient also reports difficulty in accessing her medication due to packaging issues, but denies any side effects from the medication. She has been on the new medication for  approximately two weeks.  The patient also complains of leg pain, which she describes as moving and feeling better with pressure. The pain is currently located in the thigh area on both sides. The patient also mentions that she has been taking a muscle relaxant for the leg pain, which she finds helpful.  The patient's recent CT scan results are discussed, revealing cancer spots in the right iliac bone, femurs, and new spots in the T12, L2, L3 vertebrae. However, the patient does not report any pain in these areas. The patient also mentions that her right hip sometimes hurts, especially after receiving a shot.  The patient's sodium levels and kidney function are also discussed. The patient's sodium levels are reported to be low, but not low enough to require intervention. The patient's kidney function is reported to have fluctuated, but not to a degree that requires intervention.         ALLERGIES:  is allergic to ativan [lorazepam], cefuroxime, aspirin, codeine, cyclosporine modified, and penicillins.  MEDICATIONS:  Current Outpatient Medications  Medication Sig Dispense Refill   HYDROcodone-acetaminophen (NORCO/VICODIN) 5-325 MG tablet Take 1 tablet by mouth every 6 (six) hours as needed for moderate pain (pain score 4-6). 60 tablet 0   magnesium oxide (MAG-OX) 400 (240 Mg) MG tablet Take 1 tablet (400 mg total) by mouth daily. 30 tablet 6   acetaminophen (TYLENOL) 325 MG tablet Take 2 tablets (650 mg total) by mouth every 6 (six) hours as needed. 36 tablet 0   amLODipine (NORVASC) 2.5 MG tablet Take 2.5 mg by mouth daily.     ascorbic acid (VITAMIN C) 500 MG tablet Take by mouth daily.  Capivasertib 160 MG TBPK Take 320 mg by mouth 2 (two) times daily. Four days on three days off (Patient not taking: Reported on 12/14/2022) 64 each 3   cholecalciferol (VITAMIN D3) 25 MCG (1000 UNIT) tablet Take 1,000 Units by mouth daily.     cyclobenzaprine (FLEXERIL) 5 MG tablet Take 1 tablet (5 mg  total) by mouth 3 (three) times daily as needed for muscle spasms. 30 tablet 3   docusate sodium (COLACE) 100 MG capsule Take 1 capsule (100 mg total) by mouth every 12 (twelve) hours. 30 capsule 0   ketotifen (ZADITOR) 0.025 % ophthalmic solution Place 1 drop into both eyes 2 (two) times daily as needed (dry eyes).     Multiple Vitamin (MULTIVITAMIN) capsule Take 1 capsule by mouth daily.     omeprazole (PRILOSEC) 20 MG capsule Take 20 mg by mouth daily as needed (acid reflux).     ondansetron (ZOFRAN-ODT) 4 MG disintegrating tablet Take 1 tablet (4 mg total) by mouth every 8 (eight) hours as needed for nausea or vomiting. 20 tablet 3   polyethylene glycol (MIRALAX / GLYCOLAX) 17 g packet Take 17 g by mouth daily as needed for moderate constipation.     vitamin B-12 (CYANOCOBALAMIN) 100 MCG tablet Take 100 mcg by mouth daily.     No current facility-administered medications for this visit.    PHYSICAL EXAMINATION: ECOG PERFORMANCE STATUS: 1 - Symptomatic but completely ambulatory  Vitals:   12/30/22 1324  BP: (!) 147/50  Pulse: 94  Resp: 18  Temp: 97.9 F (36.6 C)  SpO2: 100%   Filed Weights   12/30/22 1324  Weight: 126 lb 14.4 oz (57.6 kg)    Physical Exam   MUSCULOSKELETAL: Muscular pain in thighs and knees, relieved by pressure. NEUROLOGICAL: No bone pain on palpation. SKIN: Bruising and swelling on arm.      (exam performed in the presence of a chaperone)  LABORATORY DATA:  I have reviewed the data as listed    Latest Ref Rng & Units 12/28/2022   12:55 PM 12/10/2022   12:27 PM 11/12/2022    9:19 AM  CMP  Glucose 70 - 99 mg/dL 95  93  403   BUN 8 - 23 mg/dL 29  23  26    Creatinine 0.44 - 1.00 mg/dL 4.74  2.59  5.63   Sodium 135 - 145 mmol/L 133  137  139   Potassium 3.5 - 5.1 mmol/L 4.2  4.2  4.1   Chloride 98 - 111 mmol/L 99  102  104   CO2 22 - 32 mmol/L 22  26  27    Calcium 8.9 - 10.3 mg/dL 87.5  64.3  32.9   Total Protein 6.5 - 8.1 g/dL 7.2  7.0  7.1    Total Bilirubin <1.2 mg/dL 0.7  0.3  0.4   Alkaline Phos 38 - 126 U/L 101  99  104   AST 15 - 41 U/L 23  20  12    ALT 0 - 44 U/L 15  17  7      Lab Results  Component Value Date   WBC 11.4 (H) 12/28/2022   HGB 11.0 (L) 12/28/2022   HCT 32.1 (L) 12/28/2022   MCV 99.1 12/28/2022   PLT 342 12/28/2022   NEUTROABS 6.8 12/28/2022    ASSESSMENT & PLAN:  Malignant neoplasm of upper-outer quadrant of right breast in female, estrogen receptor positive (HCC) H/O right breast cancer on 2017 and 1999: Lumpectomy.  (Bilateral mastectomies in 2017:  Seen by Dr. Rennis Harding, could not tolerate antiestrogen therapy) Chest CT on 06/06/19 showed a mass in the right axilla and a mass in the right lower lung lobe. PET scan on 07/03/19 showed a right lower lobe pulmonary nodule concerning for malignancy, the right axillary mass consistent with local breast cancer recurrence, and small hypermetabolic right axillary and neck nodes consistent with metastatic adenopathy. Biopsy of the right axilla on 07/13/19 showed carcinoma consistent with breast carcinoma, HER-2 negative (1+), ER+ 95%, PR+ 75%, Ki67 25%. ---------------------------------------------------------------------------------------------------------------------------------------------- Current treatment: Anastrozole 1 mg daily started 08/14/2019 with [name] (Verzinio was discontinued because of diarrhea), switched to Faslodex with Ibrance 01/20/2022 discontinued 12/08/2022 due to progression   CT CAP 11/01/2022: Mild response to therapy of right axillary lymph nodes, right lower lobe lung mass minimally increased in size from 4 cm to 4.1 cm, osseous metastases: New T12 suspicious for progressive disease    Guardant360 11/19/2022: PIK 3 CA mutation, ESR 1 amplification high, BRCA2 loss, CDK N2 a loss, MTAP loss, RB1 by allelic loss detected   Treatment plan: Recommended treatment with Capivasertib with fulvestrant (started 12/21/2022) Capivasertib  toxicities: Tolerating it fairly well without any problems.  Bilateral thigh and right hip pain: I advised her to take magnesium supplementation.  I sent a prescription for Flexeril and hydrocodone for pain relief.  She is aware of constipation issues with narcotics. Patient will come in January for injections and MD visit.   No orders of the defined types were placed in this encounter.  The patient has a good understanding of the overall plan. she agrees with it. she will call with any problems that may develop before the next visit here. Total time spent: 30 mins including face to face time and time spent for planning, charting and co-ordination of care   Tamsen Meek, MD 12/30/22

## 2023-01-03 ENCOUNTER — Other Ambulatory Visit: Payer: Self-pay

## 2023-01-03 ENCOUNTER — Telehealth: Payer: Self-pay | Admitting: Pharmacy Technician

## 2023-01-03 ENCOUNTER — Encounter: Payer: Self-pay | Admitting: Hematology and Oncology

## 2023-01-03 ENCOUNTER — Other Ambulatory Visit (HOSPITAL_COMMUNITY): Payer: Self-pay

## 2023-01-03 NOTE — Telephone Encounter (Signed)
Oral Oncology Patient Advocate Encounter  Was successful in securing patient a $15,000 grant from [employer] to provide copayment coverage for Truqap.  This will keep the out of pocket expense at $0.     [employer] ID: [id number]  I have spoken with the patient.   The billing information is as follows and has been shared with [facility].    RxBin: [id number] PCN: [id number] Member ID: [id number] Group ID: [id number] Dates of Eligibility: 12/24/22 through 12/23/23  Fund:  Breast  Jinger Neighbors, CPhT-Adv Oncology Pharmacy Patient Advocate [facility] Direct Number: [phone]  Fax: [phone]

## 2023-01-07 ENCOUNTER — Other Ambulatory Visit (HOSPITAL_COMMUNITY): Payer: Self-pay

## 2023-01-07 ENCOUNTER — Inpatient Hospital Stay: Payer: Medicare Other | Admitting: Hematology and Oncology

## 2023-01-07 ENCOUNTER — Inpatient Hospital Stay: Payer: Medicare Other

## 2023-01-07 NOTE — Progress Notes (Signed)
Specialty Pharmacy Ongoing Clinical Assessment Note  Audrey [name] is a 82 y.o. female who is being followed by the specialty pharmacy service for RxSp Oncology   Patient's specialty medication(s) reviewed today: Capivasertib   Missed doses in the last 4 weeks: 10   Patient/Caregiver asked additional questions regarding see intervention notes about dosing.  Therapeutic benefit summary: Unable to assess   Adverse events/side effects summary: Experienced adverse events/side effects (itching and loss of appetite, both are tolerable at this time)   Patient's therapy is appropriate to: Continue    Goals Addressed             This Visit's Progress    Slow Disease Progression       Patient is initiating therapy. Patient will work on increased adherence         Follow up:  3 months  Servando Snare Specialty Pharmacist   Clinical Intervention Note  Clinical Intervention Notes: Patient has been taking 1 (160 mg tablet) twice a day, instead of 2 (320mg  total) twice a day.  I informed the patient that her provider had intended for her to take 2 tablets twice a day, I told her to start on these directions and discuss with her provider at her upcoming appointment.  I also reached out to Dr. Pamelia Hoit and Marylynn Pearson, [facility] to let them know how the patient had been taking.   Clinical Intervention Outcomes: Improved therapy adherence   Servando Snare Specialty Pharmacist

## 2023-01-07 NOTE — Progress Notes (Signed)
Specialty Pharmacy Refill Coordination Note  Audrey [name] is a 82 y.o. female contacted today regarding refills of specialty medication(s) Capivasertib   Patient requested Delivery   Delivery date: 01/11/23   Verified address: [address] [place] [id number]   Medication will be filled on 01/10/23.

## 2023-01-10 ENCOUNTER — Other Ambulatory Visit: Payer: Self-pay

## 2023-01-24 ENCOUNTER — Other Ambulatory Visit: Payer: Self-pay | Admitting: *Deleted

## 2023-01-24 DIAGNOSIS — C50919 Malignant neoplasm of unspecified site of unspecified female breast: Secondary | ICD-10-CM

## 2023-01-24 DIAGNOSIS — Z170 Estrogen receptor positive status [ER+]: Secondary | ICD-10-CM

## 2023-01-24 DIAGNOSIS — R112 Nausea with vomiting, unspecified: Secondary | ICD-10-CM

## 2023-01-24 DIAGNOSIS — R918 Other nonspecific abnormal finding of lung field: Secondary | ICD-10-CM

## 2023-01-24 NOTE — Progress Notes (Signed)
 Pt called with c/o nvd for 3-4 days. Pt stated that imodium was taken prior but didn't work. Second dose of imodium was taken this morning and was effective. Pt able to keep down toast and water this morning. She states that she's afebrile. [facility] infusion appt made for tomorrow after lab and Dr.Gudena visit. Pt made aware of appt and time and verbalized understanding.

## 2023-01-25 ENCOUNTER — Encounter: Payer: Medicare Other | Admitting: Genetic Counselor

## 2023-01-25 ENCOUNTER — Inpatient Hospital Stay: Payer: Medicare Other

## 2023-01-25 ENCOUNTER — Other Ambulatory Visit: Payer: Medicare Other

## 2023-01-25 ENCOUNTER — Other Ambulatory Visit: Payer: Self-pay | Admitting: *Deleted

## 2023-01-25 ENCOUNTER — Inpatient Hospital Stay: Payer: Medicare Other | Attending: Hematology and Oncology | Admitting: Hematology and Oncology

## 2023-01-25 VITALS — BP 153/61 | HR 93 | Temp 97.6°F | Resp 18 | Ht 62.5 in

## 2023-01-25 DIAGNOSIS — R197 Diarrhea, unspecified: Secondary | ICD-10-CM | POA: Insufficient documentation

## 2023-01-25 DIAGNOSIS — M791 Myalgia, unspecified site: Secondary | ICD-10-CM | POA: Insufficient documentation

## 2023-01-25 DIAGNOSIS — R630 Anorexia: Secondary | ICD-10-CM | POA: Insufficient documentation

## 2023-01-25 DIAGNOSIS — C7800 Secondary malignant neoplasm of unspecified lung: Secondary | ICD-10-CM | POA: Insufficient documentation

## 2023-01-25 DIAGNOSIS — Z9013 Acquired absence of bilateral breasts and nipples: Secondary | ICD-10-CM | POA: Diagnosis not present

## 2023-01-25 DIAGNOSIS — T451X5A Adverse effect of antineoplastic and immunosuppressive drugs, initial encounter: Secondary | ICD-10-CM | POA: Insufficient documentation

## 2023-01-25 DIAGNOSIS — Z886 Allergy status to analgesic agent status: Secondary | ICD-10-CM | POA: Diagnosis not present

## 2023-01-25 DIAGNOSIS — Z1721 Progesterone receptor positive status: Secondary | ICD-10-CM | POA: Insufficient documentation

## 2023-01-25 DIAGNOSIS — R739 Hyperglycemia, unspecified: Secondary | ICD-10-CM

## 2023-01-25 DIAGNOSIS — Z880 Allergy status to penicillin: Secondary | ICD-10-CM | POA: Diagnosis not present

## 2023-01-25 DIAGNOSIS — Z79811 Long term (current) use of aromatase inhibitors: Secondary | ICD-10-CM | POA: Diagnosis not present

## 2023-01-25 DIAGNOSIS — C7951 Secondary malignant neoplasm of bone: Secondary | ICD-10-CM | POA: Diagnosis not present

## 2023-01-25 DIAGNOSIS — Z885 Allergy status to narcotic agent status: Secondary | ICD-10-CM | POA: Diagnosis not present

## 2023-01-25 DIAGNOSIS — Z170 Estrogen receptor positive status [ER+]: Secondary | ICD-10-CM | POA: Insufficient documentation

## 2023-01-25 DIAGNOSIS — Z881 Allergy status to other antibiotic agents status: Secondary | ICD-10-CM | POA: Insufficient documentation

## 2023-01-25 DIAGNOSIS — R112 Nausea with vomiting, unspecified: Secondary | ICD-10-CM | POA: Insufficient documentation

## 2023-01-25 DIAGNOSIS — R918 Other nonspecific abnormal finding of lung field: Secondary | ICD-10-CM

## 2023-01-25 DIAGNOSIS — Z79899 Other long term (current) drug therapy: Secondary | ICD-10-CM | POA: Diagnosis not present

## 2023-01-25 DIAGNOSIS — C50411 Malignant neoplasm of upper-outer quadrant of right female breast: Secondary | ICD-10-CM | POA: Insufficient documentation

## 2023-01-25 DIAGNOSIS — M79606 Pain in leg, unspecified: Secondary | ICD-10-CM | POA: Insufficient documentation

## 2023-01-25 DIAGNOSIS — C50919 Malignant neoplasm of unspecified site of unspecified female breast: Secondary | ICD-10-CM

## 2023-01-25 LAB — CBC WITH DIFFERENTIAL (CANCER CENTER ONLY)
Abs Immature Granulocytes: 0.07 10*3/uL (ref 0.00–0.07)
Basophils Absolute: 0.1 10*3/uL (ref 0.0–0.1)
Basophils Relative: 1 %
Eosinophils Absolute: 0.4 10*3/uL (ref 0.0–0.5)
Eosinophils Relative: 4 %
HCT: 33.7 % — ABNORMAL LOW (ref 36.0–46.0)
Hemoglobin: 11.7 g/dL — ABNORMAL LOW (ref 12.0–15.0)
Immature Granulocytes: 1 %
Lymphocytes Relative: 27 %
Lymphs Abs: 2.3 10*3/uL (ref 0.7–4.0)
MCH: 31.3 pg (ref 26.0–34.0)
MCHC: 34.7 g/dL (ref 30.0–36.0)
MCV: 90.1 fL (ref 80.0–100.0)
Monocytes Absolute: 0.6 10*3/uL (ref 0.1–1.0)
Monocytes Relative: 7 %
Neutro Abs: 5.3 10*3/uL (ref 1.7–7.7)
Neutrophils Relative %: 60 %
Platelet Count: 326 10*3/uL (ref 150–400)
RBC: 3.74 MIL/uL — ABNORMAL LOW (ref 3.87–5.11)
RDW: 14 % (ref 11.5–15.5)
WBC Count: 8.7 10*3/uL (ref 4.0–10.5)
nRBC: 0 % (ref 0.0–0.2)

## 2023-01-25 LAB — CMP (CANCER CENTER ONLY)
ALT: 17 U/L (ref 0–44)
AST: 22 U/L (ref 15–41)
Albumin: 4.2 g/dL (ref 3.5–5.0)
Alkaline Phosphatase: 121 U/L (ref 38–126)
Anion gap: 10 (ref 5–15)
BUN: 21 mg/dL (ref 8–23)
CO2: 22 mmol/L (ref 22–32)
Calcium: 10.4 mg/dL — ABNORMAL HIGH (ref 8.9–10.3)
Chloride: 107 mmol/L (ref 98–111)
Creatinine: 1.39 mg/dL — ABNORMAL HIGH (ref 0.44–1.00)
GFR, Estimated: 38 mL/min — ABNORMAL LOW (ref >60)
Glucose, Bld: 81 mg/dL (ref 70–99)
Potassium: 4 mmol/L (ref 3.5–5.1)
Sodium: 139 mmol/L (ref 135–145)
Total Bilirubin: 0.6 mg/dL (ref 0.0–1.2)
Total Protein: 7.2 g/dL (ref 6.5–8.1)

## 2023-01-25 LAB — HEMOGLOBIN A1C
Hgb A1c MFr Bld: 6.2 % — ABNORMAL HIGH (ref 4.8–5.6)
Mean Plasma Glucose: 131.24 mg/dL

## 2023-01-25 LAB — MAGNESIUM: Magnesium: 1.8 mg/dL (ref 1.7–2.4)

## 2023-01-25 MED ORDER — DIPHENOXYLATE-ATROPINE 2.5-0.025 MG PO TABS
1.0000 | ORAL_TABLET | Freq: Four times a day (QID) | ORAL | 1 refills | Status: DC | PRN
Start: 2023-01-25 — End: 2023-04-26

## 2023-01-25 MED ORDER — ONDANSETRON HCL 8 MG PO TABS: 8.0000 mg | ORAL_TABLET | Freq: Three times a day (TID) | ORAL | 3 refills | Status: DC | PRN

## 2023-01-25 MED ORDER — CAPIVASERTIB 160 MG PO TBPK: 160.0000 mg | ORAL_TABLET | Freq: Two times a day (BID) | ORAL | Status: DC

## 2023-01-25 NOTE — Assessment & Plan Note (Signed)
 H/O right breast cancer on 2017 and 1999: Lumpectomy.  (Bilateral mastectomies in 2017: Seen by Dr. Valaria Kerns, could not tolerate antiestrogen therapy) Chest CT on 06/06/19 showed a mass in the right axilla and a mass in the right lower lung lobe. PET scan on 07/03/19 showed a right lower lobe pulmonary nodule concerning for malignancy, the right axillary mass consistent with local breast cancer recurrence, and small hypermetabolic right axillary and neck nodes consistent with metastatic adenopathy. Biopsy of the right axilla on 07/13/19 showed carcinoma consistent with breast carcinoma, HER-2 negative (1+), ER+ 95%, PR+ 75%, Ki67 25%. ---------------------------------------------------------------------------------------------------------------------------------------------- Current treatment: Anastrozole  1 mg daily started 08/14/2019 with Ibrance  (Verzinio was discontinued because of diarrhea), switched to Faslodex  with Ibrance  01/20/2022 discontinued 12/08/2022 due to progression   CT CAP 11/01/2022: Mild response to therapy of right axillary lymph nodes, right lower lobe lung mass minimally increased in size from 4 cm to 4.1 cm, osseous metastases: New T12 suspicious for progressive disease    Guardant360 11/19/2022: PIK 3 CA mutation, ESR 1 amplification high, BRCA2 loss, CDK N2 a loss, MTAP loss, RB1 by allelic loss detected   Treatment plan: Recommended treatment with Capivasertib  with fulvestrant  (started 12/21/2022) Capivasertib  toxicities: Tolerating it fairly well without any problems.   Bilateral thigh and right hip pain: I advised her to take magnesium  supplementation.  I sent a prescription for Flexeril  and hydrocodone  for pain relief.  She is aware of constipation issues with narcotics.  Return to clinic in 1 month for labs injection follow-up

## 2023-01-25 NOTE — Progress Notes (Signed)
 Patient Care Team: Marelyn Quill, MD as PCP - General (Family Medicine) Odean Potts, MD as Consulting Physician (Hematology and Oncology)  DIAGNOSIS:  Encounter Diagnosis  Name Primary?   Malignant neoplasm of upper-outer quadrant of right breast in female, estrogen receptor positive (HCC) Yes    SUMMARY OF ONCOLOGIC HISTORY: Oncology History  Malignant neoplasm of upper-outer quadrant of right breast in female, estrogen receptor positive (HCC)  07/13/2019 Initial Diagnosis   History of right breast cancer on 2017. Chest CT on 06/06/19 showed a mass in the right axilla and a mass in the right lower lung lobe. PET scan on 07/03/19 showed a right lower lobe pulmonary nodule concerning for malignancy, the right axillary mass consistent with local breast cancer recurrence, and small hypermetabolic right axillary and neck nodes consistent with metastatic adenopathy. Biopsy of the right axilla on 07/13/19 showed carcinoma consistent with breast carcinoma, HER-2 negative (1+), ER+ 95%, PR+ 75%, Ki67 25%.   08/14/2019 -  Anti-estrogen oral therapy   Initially anastrozole  alone,   Verzenio  October 2022 (Verzenio  discontinued for diarrhea), switched to Ibrance  12/08/2020   09/03/2022 Cancer Staging   Staging form: Breast, AJCC 8th Edition - Clinical: Stage IV (pM1) - Signed by Crawford Morna Pickle, NP on 09/03/2022     CHIEF COMPLIANT: Follow-up on Capivasertib   HISTORY OF PRESENT ILLNESS:   History of Present Illness   The patient, with a history of cancer, presents with worsening symptoms following a change in her treatment regimen. She reports severe nausea to the point of being unable to keep down anti-nausea medication. She also reports a loss of appetite, having not eaten since the previous day. The patient has also been experiencing diarrhea for the past five days, which has now resolved. She also reports severe leg pain, localized to the thighs, which is not constant but has been  severe enough to prevent sleep. The pain does not respond to her current pain medication. The patient also mentions a recent change in her diet, which she finds restrictive.         ALLERGIES:  is allergic to ativan [lorazepam], cefuroxime, aspirin, codeine, cyclosporine modified, and penicillins.  MEDICATIONS:  Current Outpatient Medications  Medication Sig Dispense Refill   diphenoxylate -atropine  (LOMOTIL ) 2.5-0.025 MG tablet Take 1 tablet by mouth 4 (four) times daily as needed for diarrhea or loose stools. 30 tablet 1   ondansetron  (ZOFRAN ) 8 MG tablet Take 1 tablet (8 mg total) by mouth every 8 (eight) hours as needed for nausea or vomiting. 30 tablet 3   acetaminophen  (TYLENOL ) 325 MG tablet Take 2 tablets (650 mg total) by mouth every 6 (six) hours as needed. 36 tablet 0   amLODipine (NORVASC) 2.5 MG tablet Take 2.5 mg by mouth daily.     ascorbic acid (VITAMIN C) 500 MG tablet Take by mouth daily.     Capivasertib  160 MG TBPK Take 160 mg by mouth 2 (two) times daily.     cholecalciferol (VITAMIN D3) 25 MCG (1000 UNIT) tablet Take 1,000 Units by mouth daily.     cyclobenzaprine  (FLEXERIL ) 5 MG tablet Take 1 tablet (5 mg total) by mouth 3 (three) times daily as needed for muscle spasms. 30 tablet 3   docusate sodium  (COLACE) 100 MG capsule Take 1 capsule (100 mg total) by mouth every 12 (twelve) hours. 30 capsule 0   HYDROcodone -acetaminophen  (NORCO/VICODIN) 5-325 MG tablet Take 1 tablet by mouth every 6 (six) hours as needed for moderate pain (pain score 4-6). 60 tablet 0  ketotifen (ZADITOR) 0.025 % ophthalmic solution Place 1 drop into both eyes 2 (two) times daily as needed (dry eyes).     magnesium  oxide (MAG-OX) 400 (240 Mg) MG tablet Take 1 tablet (400 mg total) by mouth daily. 30 tablet 6   Multiple Vitamin (MULTIVITAMIN) capsule Take 1 capsule by mouth daily.     omeprazole (PRILOSEC) 20 MG capsule Take 20 mg by mouth daily as needed (acid reflux).     polyethylene glycol  (MIRALAX / GLYCOLAX) 17 g packet Take 17 g by mouth daily as needed for moderate constipation.     vitamin B-12 (CYANOCOBALAMIN) 100 MCG tablet Take 100 mcg by mouth daily.     No current facility-administered medications for this visit.    PHYSICAL EXAMINATION: ECOG PERFORMANCE STATUS: 1 - Symptomatic but completely ambulatory  Vitals:   01/25/23 1123  BP: (!) 153/61  Pulse: 93  Resp: 18  Temp: 97.6 F (36.4 C)  SpO2: 100%     LABORATORY DATA:  I have reviewed the data as listed    Latest Ref Rng & Units 01/25/2023   11:15 AM 12/28/2022   12:55 PM 12/10/2022   12:27 PM  CMP  Glucose 70 - 99 mg/dL 81  95  93   BUN 8 - 23 mg/dL 21  29  23    Creatinine 0.44 - 1.00 mg/dL 8.60  8.55  8.93   Sodium 135 - 145 mmol/L 139  133  137   Potassium 3.5 - 5.1 mmol/L 4.0  4.2  4.2   Chloride 98 - 111 mmol/L 107  99  102   CO2 22 - 32 mmol/L 22  22  26    Calcium 8.9 - 10.3 mg/dL 89.5  89.7  89.8   Total Protein 6.5 - 8.1 g/dL 7.2  7.2  7.0   Total Bilirubin 0.0 - 1.2 mg/dL 0.6  0.7  0.3   Alkaline Phos 38 - 126 U/L 121  101  99   AST 15 - 41 U/L 22  23  20    ALT 0 - 44 U/L 17  15  17      Lab Results  Component Value Date   WBC 8.7 01/25/2023   HGB 11.7 (L) 01/25/2023   HCT 33.7 (L) 01/25/2023   MCV 90.1 01/25/2023   PLT 326 01/25/2023   NEUTROABS 5.3 01/25/2023    ASSESSMENT & PLAN:  Malignant neoplasm of upper-outer quadrant of right breast in female, estrogen receptor positive (HCC) H/O right breast cancer on 2017 and 1999: Lumpectomy.  (Bilateral mastectomies in 2017: Seen by Dr. Valaria Kerns, could not tolerate antiestrogen therapy) Chest CT on 06/06/19 showed a mass in the right axilla and a mass in the right lower lung lobe. PET scan on 07/03/19 showed a right lower lobe pulmonary nodule concerning for malignancy, the right axillary mass consistent with local breast cancer recurrence, and small hypermetabolic right axillary and neck nodes consistent with metastatic  adenopathy. Biopsy of the right axilla on 07/13/19 showed carcinoma consistent with breast carcinoma, HER-2 negative (1+), ER+ 95%, PR+ 75%, Ki67 25%. ---------------------------------------------------------------------------------------------------------------------------------------------- Current treatment: Anastrozole  1 mg daily started 08/14/2019 with Ibrance  (Verzinio was discontinued because of diarrhea), switched to Faslodex  with Ibrance  01/20/2022 discontinued 12/08/2022 due to progression   CT CAP 11/01/2022: Mild response to therapy of right axillary lymph nodes, right lower lobe lung mass minimally increased in size from 4 cm to 4.1 cm, osseous metastases: New T12 suspicious for progressive disease    Guardant360 11/19/2022: PIK 3  CA mutation, ESR 1 amplification high, BRCA2 loss, CDK N2 a loss, MTAP loss, RB1 by allelic loss detected   Treatment plan: Recommended treatment with Capivasertib  with fulvestrant  (started 12/21/2022) Capivasertib  toxicities: Profound nausea and vomiting Diffuse muscle pains especially in the thighs Diarrhea I recommended lowering the dosage of Capivasertib  to 1 tablet twice a day.  She is agreeable to do that. ------------------------------------- Assessment and Plan    Cancer treatment side effects Severe nausea, anorexia, and diarrhea since increasing the dose of cancer medication. Also, intermittent severe leg pain, location in the thighs, not constant, worse at night. No improvement in symptoms during the off-treatment days. -Reduce cancer medication dose to one pill in the morning and one in the evening. -Change anti-nausea medication to a type that can be swallowed. -Continue current pain medication, she can double up on dose if needed. -Provide stronger anti-diarrheal medication for use as needed. -Encourage her to eat what she can tolerate, no dietary restrictions. -Check labs in two weeks.  Follow-up in two weeks to assess symptom improvement  and medication tolerance. Plan for imaging in two months.          No orders of the defined types were placed in this encounter.  The patient has a good understanding of the overall plan. she agrees with it. she will call with any problems that may develop before the next visit here. Total time spent: 30 mins including face to face time and time spent for planning, charting and co-ordination of care   Viinay K Zalman Hull, MD 01/25/23

## 2023-02-03 ENCOUNTER — Other Ambulatory Visit: Payer: Self-pay

## 2023-02-04 ENCOUNTER — Other Ambulatory Visit: Payer: Medicare Other

## 2023-02-04 ENCOUNTER — Ambulatory Visit: Payer: Medicare Other

## 2023-02-04 ENCOUNTER — Ambulatory Visit: Payer: Medicare Other | Admitting: Adult Health

## 2023-02-07 ENCOUNTER — Other Ambulatory Visit: Payer: Self-pay | Admitting: Pharmacist

## 2023-02-07 ENCOUNTER — Other Ambulatory Visit: Payer: Self-pay

## 2023-02-07 DIAGNOSIS — Z170 Estrogen receptor positive status [ER+]: Secondary | ICD-10-CM

## 2023-02-08 ENCOUNTER — Encounter: Payer: Self-pay | Admitting: Hematology and Oncology

## 2023-02-08 ENCOUNTER — Inpatient Hospital Stay: Payer: Medicare Other

## 2023-02-08 ENCOUNTER — Inpatient Hospital Stay: Payer: Medicare Other | Admitting: Pharmacist

## 2023-02-08 VITALS — BP 152/61 | HR 52 | Temp 98.1°F | Resp 17 | Ht 62.5 in | Wt 122.2 lb

## 2023-02-08 DIAGNOSIS — Z170 Estrogen receptor positive status [ER+]: Secondary | ICD-10-CM

## 2023-02-08 DIAGNOSIS — C50411 Malignant neoplasm of upper-outer quadrant of right female breast: Secondary | ICD-10-CM | POA: Diagnosis not present

## 2023-02-08 LAB — CMP (CANCER CENTER ONLY)
ALT: 21 U/L (ref 0–44)
AST: 22 U/L (ref 15–41)
Albumin: 4.2 g/dL (ref 3.5–5.0)
Alkaline Phosphatase: 142 U/L — ABNORMAL HIGH (ref 38–126)
Anion gap: 9 (ref 5–15)
BUN: 22 mg/dL (ref 8–23)
CO2: 28 mmol/L (ref 22–32)
Calcium: 10.4 mg/dL — ABNORMAL HIGH (ref 8.9–10.3)
Chloride: 101 mmol/L (ref 98–111)
Creatinine: 1.51 mg/dL — ABNORMAL HIGH (ref 0.44–1.00)
GFR, Estimated: 35 mL/min — ABNORMAL LOW (ref >60)
Glucose, Bld: 125 mg/dL — ABNORMAL HIGH (ref 70–99)
Potassium: 3.8 mmol/L (ref 3.5–5.1)
Sodium: 138 mmol/L (ref 135–145)
Total Bilirubin: 0.6 mg/dL (ref 0.0–1.2)
Total Protein: 7.2 g/dL (ref 6.5–8.1)

## 2023-02-08 LAB — CBC WITH DIFFERENTIAL (CANCER CENTER ONLY)
Abs Immature Granulocytes: 0.12 10*3/uL — ABNORMAL HIGH (ref 0.00–0.07)
Basophils Absolute: 0.1 10*3/uL (ref 0.0–0.1)
Basophils Relative: 1 %
Eosinophils Absolute: 0.1 10*3/uL (ref 0.0–0.5)
Eosinophils Relative: 1 %
HCT: 31.7 % — ABNORMAL LOW (ref 36.0–46.0)
Hemoglobin: 10.8 g/dL — ABNORMAL LOW (ref 12.0–15.0)
Immature Granulocytes: 1 %
Lymphocytes Relative: 26 %
Lymphs Abs: 3.1 10*3/uL (ref 0.7–4.0)
MCH: 30.7 pg (ref 26.0–34.0)
MCHC: 34.1 g/dL (ref 30.0–36.0)
MCV: 90.1 fL (ref 80.0–100.0)
Monocytes Absolute: 1 10*3/uL (ref 0.1–1.0)
Monocytes Relative: 8 %
Neutro Abs: 7.9 10*3/uL — ABNORMAL HIGH (ref 1.7–7.7)
Neutrophils Relative %: 63 %
Platelet Count: 302 10*3/uL (ref 150–400)
RBC: 3.52 MIL/uL — ABNORMAL LOW (ref 3.87–5.11)
RDW: 14.7 % (ref 11.5–15.5)
WBC Count: 12.3 10*3/uL — ABNORMAL HIGH (ref 4.0–10.5)
nRBC: 0 % (ref 0.0–0.2)

## 2023-02-08 NOTE — Progress Notes (Signed)
[facility]       Telephone: [phone]?Fax: [phone]   Oncology Clinical Pharmacist Practitioner Progress Note  Audrey [name] was contacted via in-person visit to discuss her chemotherapy regimen for capivasertib which they receive under the care of Dr. Serena Croissant.   Current treatment regimen and start date Capivasertib (12/21/22) Fulvestrant (08/06/22)  Pertinent baseline laboratory data Baseline HbA1c: 5.4% (12/10/22) Baseline FBG: 93 mg/dL (29/56/21)  Interval History She continues on capivasertib 160 mg by mouth every 12 hours for 4 consecutive days, followed by 3 days off in combination with fulvestrant. This is being given  in combination with fulvestrant . Therapy is planned to continue until disease progression or unacceptable toxicity. She was last seen by Dr. Pamelia Hoit on 01/25/23 and clinical pharmacy on 12/14/23. Dose was lowered for capivasertib to 160 mg PO BID 4 days on, followed by a 3 day rest period at last visit with Dr. Pamelia Hoit. She usually starts her capivasertib on Tuesdays. She is due for fulvestrant today. She was in the ED for leg pain on 12/28/22 and discussed this with Dr. Pamelia Hoit at their last visit. Dr. Pamelia Hoit plans to do restaging sometime in March and he will see her again in 4 weeks with labs and her next fulvestrant injection.  Response to Therapy [name] unfortunately is not tolerating the capivasertib. She continues to not have an appetite and has lost close ~9 pounds since December. She also continues to have leg aches and is not sleeping well. She does not want to continue capivasertib at this time or fulvestrant. We will scheduler her with labs and a visit with Dr. Pamelia Hoit next week to discuss treatment options and will forward our note from today so he is aware.  Labs, vitals, treatment parameters, and manufacturer guidelines assessing toxicity were reviewed with Isaac Laud today. Based on these values, patient is in  agreement to HOLD therapy at this time.  Allergies Allergies  Allergen Reactions   Ativan [Lorazepam] Itching   Cefuroxime Nausea And Vomiting   Aspirin Other (See Comments)    Stomach pain   Codeine Nausea And Vomiting   Cyclosporine Modified Other (See Comments)   Penicillins Rash    Vitals    02/08/2023   10:28 AM 01/25/2023   11:23 AM 12/30/2022    1:24 PM  Oncology Vitals  Height 159 cm 159 cm 159 cm  Weight 55.43 kg  57.561 kg  Weight (lbs) 122 lbs 3 oz  126 lbs 14 oz  BMI 21.99 kg/m2 22.84 kg/m2 22.84 kg/m2  Temp 98.1 F (36.7 C) 97.6 F (36.4 C) 97.9 F (36.6 C)  Pulse Rate 52 93 94  BP 152/61 153/61 147/50  Resp 17 18 18   SpO2 100 % 100 % 100 %  BSA (m2) 1.56 m2 1.59 m2 1.59 m2    Laboratory Data    Latest Ref Rng & Units 02/08/2023    9:49 AM 01/25/2023   11:15 AM 12/28/2022   12:55 PM  CBC EXTENDED  WBC 4.0 - 10.5 K/uL 12.3  8.7  11.4   RBC 3.87 - 5.11 MIL/uL 3.52  3.74  3.24   Hemoglobin 12.0 - 15.0 g/dL 30.8  65.7  84.6   HCT 36.0 - 46.0 % 31.7  33.7  32.1   Platelets 150 - 400 K/uL 302  326  342   NEUT# 1.7 - 7.7 K/uL 7.9  5.3  6.8   Lymph# 0.7 - 4.0 K/uL 3.1  2.3  3.3        Latest Ref Rng & Units 02/08/2023    9:49 AM 01/25/2023   11:15 AM 12/28/2022   12:55 PM  CMP  Glucose 70 - 99 mg/dL 308  81  95   BUN 8 - 23 mg/dL 22  21  29    Creatinine 0.44 - 1.00 mg/dL 6.57  8.46  9.62   Sodium 135 - 145 mmol/L 138  139  133   Potassium 3.5 - 5.1 mmol/L 3.8  4.0  4.2   Chloride 98 - 111 mmol/L 101  107  99   CO2 22 - 32 mmol/L 28  22  22    Calcium 8.9 - 10.3 mg/dL 95.2  84.1  32.4   Total Protein 6.5 - 8.1 g/dL 7.2  7.2  7.2   Total Bilirubin 0.0 - 1.2 mg/dL 0.6  0.6  0.7   Alkaline Phos 38 - 126 U/L 142  121  101   AST 15 - 41 U/L 22  22  23    ALT 0 - 44 U/L 21  17  15      Lab Results  Component Value Date   MG 1.8 01/25/2023   MG 1.9 02/05/2021   MG 1.8 01/08/2021   Lab Results  Component Value Date   CA2729 368.2 (H) 12/10/2022    CA2729 291.4 (H) 11/12/2022   CA2729 215.9 (H) 10/01/2022     Adverse Effects Assessment No appetite Leg pain Elevated calcium Elevated creatinine Mouth dryness  Adherence Assessment ARYNN ARMAND reports missing 0 doses over the past 2 weeks.   Reason for missed dose: N/A Patient was re-educated on importance of adherence.   Access Assessment [name] is currently receiving her capivasertib through [facility] concerns:  none  Medication Reconciliation The patient's medication list was reviewed today with the patient? Yes New medications or herbal supplements have recently been started? No  Any medications have been discontinued? No  The medication list was updated and reconciled based on the patient's most recent medication list in the electronic medical record (EMR) including herbal products and OTC medications.   Medications Current Outpatient Medications  Medication Sig Dispense Refill   acetaminophen (TYLENOL) 325 MG tablet Take 2 tablets (650 mg total) by mouth every 6 (six) hours as needed. 36 tablet 0   amLODipine (NORVASC) 2.5 MG tablet Take 2.5 mg by mouth daily.     ascorbic acid (VITAMIN C) 500 MG tablet Take by mouth daily.     Capivasertib 160 MG TBPK Take 160 mg by mouth 2 (two) times daily. (Patient taking differently: Take 160 mg by mouth 2 (two) times daily. Take for 4 days, followed by a 3 day rest period.)     cholecalciferol (VITAMIN D3) 25 MCG (1000 UNIT) tablet Take 1,000 Units by mouth daily.     cyclobenzaprine (FLEXERIL) 5 MG tablet Take 1 tablet (5 mg total) by mouth 3 (three) times daily as needed for muscle spasms. 30 tablet 3   diphenoxylate-atropine (LOMOTIL) 2.5-0.025 MG tablet Take 1 tablet by mouth 4 (four) times daily as needed for diarrhea or loose stools. 30 tablet 1   docusate sodium (COLACE) 100 MG capsule Take 1 capsule (100 mg total) by mouth every 12 (twelve) hours. 30 capsule 0    HYDROcodone-acetaminophen (NORCO/VICODIN) 5-325 MG tablet Take 1 tablet by mouth every 6 (six) hours as needed for moderate pain (pain score 4-6). 60 tablet 0   ketotifen (ZADITOR) 0.025 %  ophthalmic solution Place 1 drop into both eyes 2 (two) times daily as needed (dry eyes).     magnesium oxide (MAG-OX) 400 (240 Mg) MG tablet Take 1 tablet (400 mg total) by mouth daily. 30 tablet 6   Multiple Vitamin (MULTIVITAMIN) capsule Take 1 capsule by mouth daily.     omeprazole (PRILOSEC) 20 MG capsule Take 20 mg by mouth daily as needed (acid reflux).     ondansetron (ZOFRAN) 8 MG tablet Take 1 tablet (8 mg total) by mouth every 8 (eight) hours as needed for nausea or vomiting. 30 tablet 3   polyethylene glycol (MIRALAX / GLYCOLAX) 17 g packet Take 17 g by mouth daily as needed for moderate constipation.     vitamin B-12 (CYANOCOBALAMIN) 100 MCG tablet Take 100 mcg by mouth daily.     No current facility-administered medications for this visit.    Drug-Drug Interactions (DDIs) DDIs were evaluated? Yes Significant DDIs?  She takes acetaminophen and Norco PRN The patient was instructed to speak with their health care provider and/or the oral chemotherapy pharmacist before starting any new drug, including prescription or over the counter, natural / herbal products, or vitamins.  Supportive Care Fever: reviewed the importance of having a thermometer and the Centers for Disease Control and Prevention (CDC) definition of fever which is 100.18F (38C) or higher. Patient should call 24/7 triage at [phone] if experiencing a fever or any other symptoms Hyperglycemia Diarrhea Embryo-Fetal toxicity Lactation Dosing instructions review Possible drug interactions Avoiding grapefruit products Cutaneous reactions -- not interested in ppx antihistamine at this time  Dosing Assessment Hepatic adjustments needed? No  Renal adjustments needed? No  Toxicity adjustments needed? Yes  The current dosing  regimen is not appropriate to continue at this time.  Follow-Up Plan STOP capivasertib 160 mg BID 4 days on, followed by a 3 day rest period. Dose was lowered at last Dr. Pamelia Hoit visit on 01/25/23 HOLD fulvestrant today per patient request. Notified infusion of change. She will see Dr. Pamelia Hoit with labs later next week and contact his clinic should symptoms not improve Labs, Dr. Pamelia Hoit visit in one week to discuss other treatment options. She does not want to continue fulvestrant and capivasertib at this time Monitor calcium levels. Elevated slightly at last two visits. Recommending holding vitamin D supplement for now Monitor creatinine levels. Encouraged fluid intake Monitor weight. She has had no appetite lately. Likely due to capivasertib. Encouraged nutritional shakes for calories and protein. Dr. Pamelia Hoit may schedule patient with labs and clinical pharmacy as needed going forward.  Isaac Laud participated in the discussion, expressed understanding, and voiced agreement with the above plan. All questions were answered to her satisfaction. The patient was advised to contact the clinic at [phone] with any questions or concerns prior to her return visit.   I spent 30 minutes assessing and educating the patient.  Khushbu Pippen A. Odetta Pink, PharmD, BCOP, CPP  Anselm Lis, RPH-CPP, 02/08/2023  11:02 AM   **Disclaimer: This note was dictated with voice recognition software. Similar sounding words can inadvertently be transcribed and this note may contain transcription errors which may not have been corrected upon publication of note.**

## 2023-02-09 LAB — CANCER ANTIGEN 27.29: CA 27.29: 398.7 U/mL — ABNORMAL HIGH (ref 0.0–38.6)

## 2023-02-11 ENCOUNTER — Other Ambulatory Visit (HOSPITAL_COMMUNITY): Payer: Self-pay

## 2023-02-14 ENCOUNTER — Other Ambulatory Visit: Payer: Self-pay

## 2023-02-15 ENCOUNTER — Other Ambulatory Visit: Payer: Self-pay | Admitting: Pharmacy Technician

## 2023-02-15 ENCOUNTER — Other Ambulatory Visit (HOSPITAL_COMMUNITY): Payer: Self-pay

## 2023-02-15 NOTE — Progress Notes (Signed)
Per visit with Jonny Ruiz 02/08/23, patient has requested to stop treatment with Truqap at this time. Compass Rose will be paused pending treatment change.

## 2023-02-21 ENCOUNTER — Inpatient Hospital Stay: Payer: Medicare Other | Attending: Hematology and Oncology | Admitting: Hematology and Oncology

## 2023-02-21 ENCOUNTER — Inpatient Hospital Stay: Payer: Medicare Other

## 2023-02-21 VITALS — BP 157/79 | HR 109 | Resp 19 | Ht 62.5 in | Wt 120.0 lb

## 2023-02-21 DIAGNOSIS — M79606 Pain in leg, unspecified: Secondary | ICD-10-CM | POA: Insufficient documentation

## 2023-02-21 DIAGNOSIS — R630 Anorexia: Secondary | ICD-10-CM | POA: Diagnosis not present

## 2023-02-21 DIAGNOSIS — R5383 Other fatigue: Secondary | ICD-10-CM | POA: Insufficient documentation

## 2023-02-21 DIAGNOSIS — Z79811 Long term (current) use of aromatase inhibitors: Secondary | ICD-10-CM | POA: Diagnosis not present

## 2023-02-21 DIAGNOSIS — C773 Secondary and unspecified malignant neoplasm of axilla and upper limb lymph nodes: Secondary | ICD-10-CM | POA: Insufficient documentation

## 2023-02-21 DIAGNOSIS — C7951 Secondary malignant neoplasm of bone: Secondary | ICD-10-CM | POA: Diagnosis not present

## 2023-02-21 DIAGNOSIS — Z170 Estrogen receptor positive status [ER+]: Secondary | ICD-10-CM

## 2023-02-21 DIAGNOSIS — Z880 Allergy status to penicillin: Secondary | ICD-10-CM | POA: Insufficient documentation

## 2023-02-21 DIAGNOSIS — Z79899 Other long term (current) drug therapy: Secondary | ICD-10-CM | POA: Diagnosis not present

## 2023-02-21 DIAGNOSIS — C50411 Malignant neoplasm of upper-outer quadrant of right female breast: Secondary | ICD-10-CM | POA: Insufficient documentation

## 2023-02-21 DIAGNOSIS — Z9013 Acquired absence of bilateral breasts and nipples: Secondary | ICD-10-CM | POA: Insufficient documentation

## 2023-02-21 DIAGNOSIS — C7800 Secondary malignant neoplasm of unspecified lung: Secondary | ICD-10-CM | POA: Insufficient documentation

## 2023-02-21 DIAGNOSIS — Z881 Allergy status to other antibiotic agents status: Secondary | ICD-10-CM | POA: Diagnosis not present

## 2023-02-21 DIAGNOSIS — R112 Nausea with vomiting, unspecified: Secondary | ICD-10-CM | POA: Insufficient documentation

## 2023-02-21 DIAGNOSIS — Z886 Allergy status to analgesic agent status: Secondary | ICD-10-CM | POA: Insufficient documentation

## 2023-02-21 DIAGNOSIS — Z885 Allergy status to narcotic agent status: Secondary | ICD-10-CM | POA: Insufficient documentation

## 2023-02-21 LAB — CMP (CANCER CENTER ONLY)
ALT: 13 U/L (ref 0–44)
AST: 17 U/L (ref 15–41)
Albumin: 4.2 g/dL (ref 3.5–5.0)
Alkaline Phosphatase: 158 U/L — ABNORMAL HIGH (ref 38–126)
Anion gap: 8 (ref 5–15)
BUN: 18 mg/dL (ref 8–23)
CO2: 28 mmol/L (ref 22–32)
Calcium: 10.6 mg/dL — ABNORMAL HIGH (ref 8.9–10.3)
Chloride: 99 mmol/L (ref 98–111)
Creatinine: 1.06 mg/dL — ABNORMAL HIGH (ref 0.44–1.00)
GFR, Estimated: 53 mL/min — ABNORMAL LOW (ref >60)
Glucose, Bld: 88 mg/dL (ref 70–99)
Potassium: 4.1 mmol/L (ref 3.5–5.1)
Sodium: 135 mmol/L (ref 135–145)
Total Bilirubin: 0.4 mg/dL (ref 0.0–1.2)
Total Protein: 7.3 g/dL (ref 6.5–8.1)

## 2023-02-21 LAB — CBC WITH DIFFERENTIAL (CANCER CENTER ONLY)
Abs Immature Granulocytes: 0.15 10*3/uL — ABNORMAL HIGH (ref 0.00–0.07)
Basophils Absolute: 0.1 10*3/uL (ref 0.0–0.1)
Basophils Relative: 1 %
Eosinophils Absolute: 0.2 10*3/uL (ref 0.0–0.5)
Eosinophils Relative: 1 %
HCT: 31.6 % — ABNORMAL LOW (ref 36.0–46.0)
Hemoglobin: 10.5 g/dL — ABNORMAL LOW (ref 12.0–15.0)
Immature Granulocytes: 1 %
Lymphocytes Relative: 27 %
Lymphs Abs: 3.2 10*3/uL (ref 0.7–4.0)
MCH: 30 pg (ref 26.0–34.0)
MCHC: 33.2 g/dL (ref 30.0–36.0)
MCV: 90.3 fL (ref 80.0–100.0)
Monocytes Absolute: 0.7 10*3/uL (ref 0.1–1.0)
Monocytes Relative: 6 %
Neutro Abs: 7.6 10*3/uL (ref 1.7–7.7)
Neutrophils Relative %: 64 %
Platelet Count: 430 10*3/uL — ABNORMAL HIGH (ref 150–400)
RBC: 3.5 MIL/uL — ABNORMAL LOW (ref 3.87–5.11)
RDW: 14.1 % (ref 11.5–15.5)
WBC Count: 11.9 10*3/uL — ABNORMAL HIGH (ref 4.0–10.5)
nRBC: 0 % (ref 0.0–0.2)

## 2023-02-21 NOTE — Assessment & Plan Note (Signed)
 H/O right breast cancer on 2017 and 1999: Lumpectomy.  (Bilateral mastectomies in 2017: Seen by Dr. Ciro Cress, could not tolerate antiestrogen therapy) Chest CT on 06/06/19 showed a mass in the right axilla and a mass in the right lower lung lobe. PET scan on 07/03/19 showed a right lower lobe pulmonary nodule concerning for malignancy, the right axillary mass consistent with local breast cancer recurrence, and small hypermetabolic right axillary and neck nodes consistent with metastatic adenopathy. Biopsy of the right axilla on 07/13/19 showed carcinoma consistent with breast carcinoma, HER-2 negative (1+), ER+ 95%, PR+ 75%, Ki67 25%. ---------------------------------------------------------------------------------------------------------------------------------------------- Current treatment: Anastrozole  1 mg daily started 08/14/2019 with Ibrance  (Verzinio was discontinued because of diarrhea), switched to Faslodex  with Ibrance  01/20/2022 discontinued 12/08/2022 due to progression   CT CAP 11/01/2022: Mild response to therapy of right axillary lymph nodes, right lower lobe lung mass minimally increased in size from 4 cm to 4.1 cm, osseous metastases: New T12 suspicious for progressive disease    Guardant360 11/19/2022: PIK 3 CA mutation, ESR 1 amplification high, BRCA2 loss, CDK N2 a loss, MTAP loss, RB1 by allelic loss detected   Treatment plan: Capivasertib with fulvestrant  (started 12/21/2022)  Capivasertib toxicities: Profound nausea and vomiting and anorexia Diffuse muscle pains especially in the thighs Diarrhea I recommended lowering the dosage of Capivasertib to 1 tablet twice a day.    CT scans to be done in March 2025

## 2023-02-21 NOTE — Progress Notes (Signed)
 Patient Care Team: Ardella Beaver, MD as PCP - General (Family Medicine) Cameron Cea, MD as Consulting Physician (Hematology and Oncology)  DIAGNOSIS:  Encounter Diagnosis  Name Primary?   Malignant neoplasm of upper-outer quadrant of right breast in female, estrogen receptor positive (HCC) Yes    SUMMARY OF ONCOLOGIC HISTORY: Oncology History  Malignant neoplasm of upper-outer quadrant of right breast in female, estrogen receptor positive (HCC)  07/13/2019 Initial Diagnosis   History of right breast cancer on 2017. Chest CT on 06/06/19 showed a mass in the right axilla and a mass in the right lower lung lobe. PET scan on 07/03/19 showed a right lower lobe pulmonary nodule concerning for malignancy, the right axillary mass consistent with local breast cancer recurrence, and small hypermetabolic right axillary and neck nodes consistent with metastatic adenopathy. Biopsy of the right axilla on 07/13/19 showed carcinoma consistent with breast carcinoma, HER-2 negative (1+), ER+ 95%, PR+ 75%, Ki67 25%.   08/14/2019 -  Anti-estrogen oral therapy   Initially anastrozole  alone,   Verzenio  October 2022 (Verzenio  discontinued for diarrhea), switched to Ibrance  12/08/2020   09/03/2022 Cancer Staging   Staging form: Breast, AJCC 8th Edition - Clinical: Stage IV (pM1) - Signed by Percival Brace, NP on 09/03/2022     CHIEF COMPLIANT: Follow-up of metastatic breast cancer  HISTORY OF PRESENT ILLNESS: Audrey [name] is a 83 year old with metastatic breast cancer who has been on Capivasertib for the past month or so and has a very hard time tolerating it.  She could not tolerate even a reduced dosage and therefore it was discontinued.  She is starting to feel better with regards her energy levels.  She also profound nausea vomiting and lack of appetite.  She also complains of pain in the legs which are bothering her.       ALLERGIES:  is allergic to ativan [lorazepam], cefuroxime, aspirin,  codeine, cyclosporine modified, and penicillins.  MEDICATIONS:  Current Outpatient Medications  Medication Sig Dispense Refill   acetaminophen  (TYLENOL ) 325 MG tablet Take 2 tablets (650 mg total) by mouth every 6 (six) hours as needed. 36 tablet 0   amLODipine (NORVASC) 2.5 MG tablet Take 2.5 mg by mouth daily.     ascorbic acid (VITAMIN C) 500 MG tablet Take by mouth daily.     cholecalciferol (VITAMIN D3) 25 MCG (1000 UNIT) tablet Take 1,000 Units by mouth daily.     cyclobenzaprine  (FLEXERIL ) 5 MG tablet Take 1 tablet (5 mg total) by mouth 3 (three) times daily as needed for muscle spasms. 30 tablet 3   diphenoxylate -atropine  (LOMOTIL ) 2.5-0.025 MG tablet Take 1 tablet by mouth 4 (four) times daily as needed for diarrhea or loose stools. 30 tablet 1   docusate sodium  (COLACE) 100 MG capsule Take 1 capsule (100 mg total) by mouth every 12 (twelve) hours. 30 capsule 0   HYDROcodone -acetaminophen  (NORCO/VICODIN) 5-325 MG tablet Take 1 tablet by mouth every 6 (six) hours as needed for moderate pain (pain score 4-6). 60 tablet 0   ketotifen (ZADITOR) 0.025 % ophthalmic solution Place 1 drop into both eyes 2 (two) times daily as needed (dry eyes).     magnesium  oxide (MAG-OX) 400 (240 Mg) MG tablet Take 1 tablet (400 mg total) by mouth daily. 30 tablet 6   Multiple Vitamin (MULTIVITAMIN) capsule Take 1 capsule by mouth daily.     omeprazole (PRILOSEC) 20 MG capsule Take 20 mg by mouth daily as needed (acid reflux).     ondansetron  (ZOFRAN ) 8 MG  tablet Take 1 tablet (8 mg total) by mouth every 8 (eight) hours as needed for nausea or vomiting. 30 tablet 3   polyethylene glycol (MIRALAX / GLYCOLAX) 17 g packet Take 17 g by mouth daily as needed for moderate constipation.     vitamin B-12 (CYANOCOBALAMIN) 100 MCG tablet Take 100 mcg by mouth daily.     No current facility-administered medications for this visit.    PHYSICAL EXAMINATION: ECOG PERFORMANCE STATUS: 1 - Symptomatic but completely  ambulatory  Vitals:   02/21/23 1056  BP: (!) 157/79  Pulse: (!) 109  Resp: 19  SpO2: 99%   Filed Weights   02/21/23 1056  Weight: 120 lb (54.4 kg)    Physical Exam          (exam performed in the presence of a chaperone)  LABORATORY DATA:  I have reviewed the data as listed    Latest Ref Rng & Units 02/08/2023    9:49 AM 01/25/2023   11:15 AM 12/28/2022   12:55 PM  CMP  Glucose 70 - 99 mg/dL 045  81  95   BUN 8 - 23 mg/dL 22  21  29    Creatinine 0.44 - 1.00 mg/dL 4.09  8.11  9.14   Sodium 135 - 145 mmol/L 138  139  133   Potassium 3.5 - 5.1 mmol/L 3.8  4.0  4.2   Chloride 98 - 111 mmol/L 101  107  99   CO2 22 - 32 mmol/L 28  22  22    Calcium 8.9 - 10.3 mg/dL 78.2  95.6  21.3   Total Protein 6.5 - 8.1 g/dL 7.2  7.2  7.2   Total Bilirubin 0.0 - 1.2 mg/dL 0.6  0.6  0.7   Alkaline Phos 38 - 126 U/L 142  121  101   AST 15 - 41 U/L 22  22  23    ALT 0 - 44 U/L 21  17  15      Lab Results  Component Value Date   WBC 11.9 (H) 02/21/2023   HGB 10.5 (L) 02/21/2023   HCT 31.6 (L) 02/21/2023   MCV 90.3 02/21/2023   PLT 430 (H) 02/21/2023   NEUTROABS 7.6 02/21/2023    ASSESSMENT & PLAN:  Malignant neoplasm of upper-outer quadrant of right breast in female, estrogen receptor positive (HCC) H/O right breast cancer on 2017 and 1999: Lumpectomy.  (Bilateral mastectomies in 2017: Seen by Dr. Ciro Cress, could not tolerate antiestrogen therapy) Chest CT on 06/06/19 showed a mass in the right axilla and a mass in the right lower lung lobe. PET scan on 07/03/19 showed a right lower lobe pulmonary nodule concerning for malignancy, the right axillary mass consistent with local breast cancer recurrence, and small hypermetabolic right axillary and neck nodes consistent with metastatic adenopathy. Biopsy of the right axilla on 07/13/19 showed carcinoma consistent with breast carcinoma, HER-2 negative (1+), ER+ 95%, PR+ 75%, Ki67  25%. ---------------------------------------------------------------------------------------------------------------------------------------------- Current treatment: Anastrozole  1 mg daily started 08/14/2019 with Ibrance  (Verzinio was discontinued because of diarrhea), switched to Faslodex  with Ibrance  01/20/2022 discontinued 12/08/2022 due to progression   CT CAP 11/01/2022: Mild response to therapy of right axillary lymph nodes, right lower lobe lung mass minimally increased in size from 4 cm to 4.1 cm, osseous metastases: New T12 suspicious for progressive disease    Guardant360 11/19/2022: PIK 3 CA mutation, ESR 1 amplification high, BRCA2 loss, CDK N2 a loss, MTAP loss, RB1 by allelic loss detected   Treatment plan: Capivasertib  with fulvestrant  (started 12/21/2022) Capivasertib discontinued 02/08/2023 due to severe intolerance, fatigue, nausea and vomiting, lack of appetite, leg pains  Treatment options: I discussed with the patient about Xeloda, exemestane and everolimus, Enhertu, Trodelvy as well as future treatment options like imlunestrant with Verzinio etc.  We decided not to do any treatments for a month to allow her to get stronger and gain more energy. CT scans to be done end of February 2025 Follow-up after the scans to discuss results and finalize a treatment plan.   No orders of the defined types were placed in this encounter.  The patient has a good understanding of the overall plan. she agrees with it. she will call with any problems that may develop before the next visit here. Total time spent: 30 mins including face to face time and time spent for planning, charting and co-ordination of care   Margert Sheerer, MD 02/21/23   03/31/2023.

## 2023-02-22 LAB — CANCER ANTIGEN 27.29: CA 27.29: 415.9 U/mL — ABNORMAL HIGH (ref 0.0–38.6)

## 2023-02-23 ENCOUNTER — Telehealth: Payer: Self-pay | Admitting: Hematology and Oncology

## 2023-02-23 NOTE — Telephone Encounter (Signed)
Scheduled appointment per 2/10 los. Patient is aware of the made appointment.

## 2023-03-07 ENCOUNTER — Ambulatory Visit (HOSPITAL_COMMUNITY)
Admission: RE | Admit: 2023-03-07 | Discharge: 2023-03-07 | Disposition: A | Discharge status: Home / Self Care | Payer: Medicare Other | Source: Ambulatory Visit | Attending: Hematology and Oncology | Admitting: Hematology and Oncology

## 2023-03-07 DIAGNOSIS — Z170 Estrogen receptor positive status [ER+]: Secondary | ICD-10-CM | POA: Insufficient documentation

## 2023-03-07 DIAGNOSIS — C50411 Malignant neoplasm of upper-outer quadrant of right female breast: Secondary | ICD-10-CM | POA: Diagnosis present

## 2023-03-07 MED ORDER — IOHEXOL 300 MG/ML  SOLN
100.0000 mL | Freq: Once | INTRAMUSCULAR | Status: AC | PRN
  Administered 2023-03-07: 100 mL via INTRAVENOUS

## 2023-03-07 MED ORDER — IOHEXOL 300 MG/ML  SOLN
30.0000 mL | Freq: Once | INTRAMUSCULAR | Status: AC | PRN
  Administered 2023-03-07: 30 mL via ORAL

## 2023-03-15 ENCOUNTER — Telehealth: Payer: Self-pay | Admitting: Hematology and Oncology

## 2023-03-15 ENCOUNTER — Inpatient Hospital Stay: Payer: Medicare Other | Attending: Hematology and Oncology | Admitting: Hematology and Oncology

## 2023-03-15 VITALS — BP 145/55 | HR 116 | Temp 97.8°F | Resp 20 | Ht 62.25 in | Wt 120.7 lb

## 2023-03-15 DIAGNOSIS — Z79811 Long term (current) use of aromatase inhibitors: Secondary | ICD-10-CM | POA: Diagnosis not present

## 2023-03-15 DIAGNOSIS — Z9013 Acquired absence of bilateral breasts and nipples: Secondary | ICD-10-CM | POA: Diagnosis not present

## 2023-03-15 DIAGNOSIS — C7801 Secondary malignant neoplasm of right lung: Secondary | ICD-10-CM | POA: Diagnosis not present

## 2023-03-15 DIAGNOSIS — Z886 Allergy status to analgesic agent status: Secondary | ICD-10-CM | POA: Diagnosis not present

## 2023-03-15 DIAGNOSIS — R5383 Other fatigue: Secondary | ICD-10-CM | POA: Diagnosis not present

## 2023-03-15 DIAGNOSIS — C7951 Secondary malignant neoplasm of bone: Secondary | ICD-10-CM | POA: Insufficient documentation

## 2023-03-15 DIAGNOSIS — G8929 Other chronic pain: Secondary | ICD-10-CM | POA: Insufficient documentation

## 2023-03-15 DIAGNOSIS — Z881 Allergy status to other antibiotic agents status: Secondary | ICD-10-CM | POA: Insufficient documentation

## 2023-03-15 DIAGNOSIS — Z79899 Other long term (current) drug therapy: Secondary | ICD-10-CM | POA: Insufficient documentation

## 2023-03-15 DIAGNOSIS — Z885 Allergy status to narcotic agent status: Secondary | ICD-10-CM | POA: Insufficient documentation

## 2023-03-15 DIAGNOSIS — C50411 Malignant neoplasm of upper-outer quadrant of right female breast: Secondary | ICD-10-CM | POA: Insufficient documentation

## 2023-03-15 DIAGNOSIS — M79606 Pain in leg, unspecified: Secondary | ICD-10-CM | POA: Insufficient documentation

## 2023-03-15 DIAGNOSIS — Z170 Estrogen receptor positive status [ER+]: Secondary | ICD-10-CM | POA: Insufficient documentation

## 2023-03-15 DIAGNOSIS — Z880 Allergy status to penicillin: Secondary | ICD-10-CM | POA: Insufficient documentation

## 2023-03-15 DIAGNOSIS — R630 Anorexia: Secondary | ICD-10-CM | POA: Diagnosis not present

## 2023-03-15 MED ORDER — MEGESTROL ACETATE 40 MG/ML PO SUSP: 200.0000 mg | Freq: Every day | ORAL | 0 refills | Status: DC

## 2023-03-15 NOTE — Progress Notes (Signed)
 Patient Care Team: Maris Berger, MD as PCP - General (Family Medicine) Serena Croissant, MD as Consulting Physician (Hematology and Oncology)  DIAGNOSIS:  Encounter Diagnosis  Name Primary?   Malignant neoplasm of upper-outer quadrant of right breast in female, estrogen receptor positive (HCC) Yes    SUMMARY OF ONCOLOGIC HISTORY: Oncology History  Malignant neoplasm of upper-outer quadrant of right breast in female, estrogen receptor positive (HCC)  07/13/2019 Initial Diagnosis   History of right breast cancer on 2017. Chest CT on 06/06/19 showed a mass in the right axilla and a mass in the right lower lung lobe. PET scan on 07/03/19 showed a right lower lobe pulmonary nodule concerning for malignancy, the right axillary mass consistent with local breast cancer recurrence, and small hypermetabolic right axillary and neck nodes consistent with metastatic adenopathy. Biopsy of the right axilla on 07/13/19 showed carcinoma consistent with breast carcinoma, HER-2 negative (1+), ER+ 95%, PR+ 75%, Ki67 25%.   08/14/2019 -  Anti-estrogen oral therapy   Initially anastrozole alone,   Verzenio October 2022 (Verzenio discontinued for diarrhea), switched to [facility] 12/08/2020   09/03/2022 Cancer Staging   Staging form: Breast, AJCC 8th Edition - Clinical: Stage IV (pM1) - Signed by Loa Socks, NP on 09/03/2022     CHIEF COMPLIANT: Follow-up of metastatic breast cancer  HISTORY OF PRESENT ILLNESS:   History of Present Illness The patient, with a known diagnosis of cancer, reports no improvement in symptoms after a month off treatment. She describes a persistent lack of energy and appetite, often having to force herself to eat. She also reports constant leg pain, which varies in location from the upper to lower leg. The patient also experiences pain in her buttocks. Despite these symptoms, the patient's condition has remained largely stable, with no significant changes reported.      ALLERGIES:  is allergic to ativan [lorazepam], cefuroxime, aspirin, codeine, cyclosporine modified, and penicillins.  MEDICATIONS:  Current Outpatient Medications  Medication Sig Dispense Refill   acetaminophen (TYLENOL) 325 MG tablet Take 2 tablets (650 mg total) by mouth every 6 (six) hours as needed. 36 tablet 0   amLODipine (NORVASC) 2.5 MG tablet Take 2.5 mg by mouth daily.     ascorbic acid (VITAMIN C) 500 MG tablet Take by mouth daily.     cholecalciferol (VITAMIN D3) 25 MCG (1000 UNIT) tablet Take 1,000 Units by mouth daily.     cyclobenzaprine (FLEXERIL) 5 MG tablet Take 1 tablet (5 mg total) by mouth 3 (three) times daily as needed for muscle spasms. 30 tablet 3   diphenoxylate-atropine (LOMOTIL) 2.5-0.025 MG tablet Take 1 tablet by mouth 4 (four) times daily as needed for diarrhea or loose stools. 30 tablet 1   docusate sodium (COLACE) 100 MG capsule Take 1 capsule (100 mg total) by mouth every 12 (twelve) hours. 30 capsule 0   HYDROcodone-acetaminophen (NORCO/VICODIN) 5-325 MG tablet Take 1 tablet by mouth every 6 (six) hours as needed for moderate pain (pain score 4-6). 60 tablet 0   ketotifen (ZADITOR) 0.025 % ophthalmic solution Place 1 drop into both eyes 2 (two) times daily as needed (dry eyes).     megestrol (MEGACE) 40 MG/ML suspension Take 5 mLs (200 mg total) by mouth daily. 150 mL 0   Multiple Vitamin (MULTIVITAMIN) capsule Take 1 capsule by mouth daily.     omeprazole (PRILOSEC) 20 MG capsule Take 20 mg by mouth daily as needed (acid reflux).     ondansetron (ZOFRAN) 8 MG tablet Take 1  tablet (8 mg total) by mouth every 8 (eight) hours as needed for nausea or vomiting. 30 tablet 3   polyethylene glycol (MIRALAX / GLYCOLAX) 17 g packet Take 17 g by mouth daily as needed for moderate constipation.     No current facility-administered medications for this visit.    PHYSICAL EXAMINATION: ECOG PERFORMANCE STATUS: 1 - Symptomatic but completely ambulatory  Vitals:    03/15/23 1130  BP: (!) 145/55  Pulse: (!) 116  Resp: 20  Temp: 97.8 F (36.6 C)  SpO2: 100%   Filed Weights   03/15/23 1130  Weight: 120 lb 11.2 oz (54.7 kg)      LABORATORY DATA:  I have reviewed the data as listed    Latest Ref Rng & Units 02/21/2023   10:30 AM 02/08/2023    9:49 AM 01/25/2023   11:15 AM  CMP  Glucose 70 - 99 mg/dL 88  161  81   BUN 8 - 23 mg/dL 18  22  21    Creatinine 0.44 - 1.00 mg/dL 0.96  0.45  4.09   Sodium 135 - 145 mmol/L 135  138  139   Potassium 3.5 - 5.1 mmol/L 4.1  3.8  4.0   Chloride 98 - 111 mmol/L 99  101  107   CO2 22 - 32 mmol/L 28  28  22    Calcium 8.9 - 10.3 mg/dL 81.1  91.4  78.2   Total Protein 6.5 - 8.1 g/dL 7.3  7.2  7.2   Total Bilirubin 0.0 - 1.2 mg/dL 0.4  0.6  0.6   Alkaline Phos 38 - 126 U/L 158  142  121   AST 15 - 41 U/L 17  22  22    ALT 0 - 44 U/L 13  21  17      Lab Results  Component Value Date   WBC 11.9 (H) 02/21/2023   HGB 10.5 (L) 02/21/2023   HCT 31.6 (L) 02/21/2023   MCV 90.3 02/21/2023   PLT 430 (H) 02/21/2023   NEUTROABS 7.6 02/21/2023    ASSESSMENT & PLAN:  Malignant neoplasm of upper-outer quadrant of right breast in female, estrogen receptor positive (HCC) H/O right breast cancer on 2017 and 1999: Lumpectomy.  (Bilateral mastectomies in 2017: Seen by Dr. Rennis Harding, could not tolerate antiestrogen therapy) Chest CT on 06/06/19 showed a mass in the right axilla and a mass in the right lower lung lobe. PET scan on 07/03/19 showed a right lower lobe pulmonary nodule concerning for malignancy, the right axillary mass consistent with local breast cancer recurrence, and small hypermetabolic right axillary and neck nodes consistent with metastatic adenopathy. Biopsy of the right axilla on 07/13/19 showed carcinoma consistent with breast carcinoma, HER-2 negative (1+), ER+ 95%, PR+ 75%, Ki67  25%. ---------------------------------------------------------------------------------------------------------------------------------------------- Current treatment: Anastrozole 1 mg daily started 08/14/2019 with [name] (Verzinio was discontinued because of diarrhea), switched to Faslodex with Ibrance 01/20/2022 discontinued 12/08/2022 due to progression   CT CAP 11/01/2022: Mild response to therapy of right axillary lymph nodes, right lower lobe lung mass minimally increased in size from 4 cm to 4.1 cm, osseous metastases: New T12 suspicious for progressive disease    Guardant360 11/19/2022: PIK 3 CA mutation, ESR 1 amplification high, BRCA2 loss, CDK N2 a loss, MTAP loss, RB1 by allelic loss detected   Treatment plan: Capivasertib with fulvestrant (started 12/21/2022) Capivasertib discontinued 02/08/2023 due to severe intolerance, fatigue, nausea and vomiting, lack of appetite, leg pains   Treatment options: I discussed with the patient  about Xeloda, exemestane and everolimus, Enhertu, Drinda Butts as well as future treatment options like imlunestrant with Verzinio etc.   We decided not to do any treatments for a month to allow her to get stronger and gain more energy. CT CAP 03/07/2023: Slight progression in the bone metastases in the lumbar spine but otherwise overall stable findings  Decreased appetite: I sent a prescription for Megace. Follow-up in 6 weeks to reassess her symptoms and decide if any additional treatments would be required.  Based on patient's wishes we may not put her on any treatments but will watch and monitor her.  Assessment & Plan  Chronic Pain Persistent pain in legs and tailbone, not clearly linked to cancer progression on imaging. -Consider radiation therapy for localized pain if identifiable lesion on imaging. -Explore long-acting pain medications with palliative care consultation.  Poor Appetite and Low Energy Likely multifactorial, including effects of cancer and  previous treatments. -Start appetite stimulant (5 mL daily). -Encourage protein supplementation and calorie counting to ensure adequate nutritional intake.      No orders of the defined types were placed in this encounter.  The patient has a good understanding of the overall plan. she agrees with it. she will call with any problems that may develop before the next visit here. Total time spent: 30 mins including face to face time and time spent for planning, charting and co-ordination of care   Tamsen Meek, MD 03/15/23

## 2023-03-15 NOTE — Assessment & Plan Note (Signed)
 H/O right breast cancer on 2017 and 1999: Lumpectomy.  (Bilateral mastectomies in 2017: Seen by Dr. Rennis Harding, could not tolerate antiestrogen therapy) Chest CT on 06/06/19 showed a mass in the right axilla and a mass in the right lower lung lobe. PET scan on 07/03/19 showed a right lower lobe pulmonary nodule concerning for malignancy, the right axillary mass consistent with local breast cancer recurrence, and small hypermetabolic right axillary and neck nodes consistent with metastatic adenopathy. Biopsy of the right axilla on 07/13/19 showed carcinoma consistent with breast carcinoma, HER-2 negative (1+), ER+ 95%, PR+ 75%, Ki67 25%. ---------------------------------------------------------------------------------------------------------------------------------------------- Current treatment: Anastrozole 1 mg daily started 08/14/2019 with [name] (Verzinio was discontinued because of diarrhea), switched to Faslodex with Ibrance 01/20/2022 discontinued 12/08/2022 due to progression   CT CAP 11/01/2022: Mild response to therapy of right axillary lymph nodes, right lower lobe lung mass minimally increased in size from 4 cm to 4.1 cm, osseous metastases: New T12 suspicious for progressive disease    Guardant360 11/19/2022: PIK 3 CA mutation, ESR 1 amplification high, BRCA2 loss, CDK N2 a loss, MTAP loss, RB1 by allelic loss detected   Treatment plan: Capivasertib with fulvestrant (started 12/21/2022) Capivasertib discontinued 02/08/2023 due to severe intolerance, fatigue, nausea and vomiting, lack of appetite, leg pains   Treatment options: I discussed with the patient about Xeloda, exemestane and everolimus, Enhertu, Trodelvy as well as future treatment options like imlunestrant with Verzinio etc.   We decided not to do any treatments for a month to allow her to get stronger and gain more energy. CT CAP 03/07/2023:  Follow-up after the scans to discuss results and finalize a treatment plan.

## 2023-04-15 ENCOUNTER — Other Ambulatory Visit: Payer: Self-pay

## 2023-04-15 ENCOUNTER — Emergency Department (HOSPITAL_COMMUNITY)
Admission: EM | Admit: 2023-04-15 | Discharge: 2023-04-15 | Disposition: A | Discharge status: Home / Self Care | Attending: Emergency Medicine | Admitting: Emergency Medicine

## 2023-04-15 ENCOUNTER — Encounter (HOSPITAL_COMMUNITY): Payer: Self-pay

## 2023-04-15 ENCOUNTER — Encounter: Payer: Self-pay | Admitting: Hematology and Oncology

## 2023-04-15 DIAGNOSIS — R52 Pain, unspecified: Secondary | ICD-10-CM

## 2023-04-15 DIAGNOSIS — M79602 Pain in left arm: Secondary | ICD-10-CM | POA: Diagnosis not present

## 2023-04-15 DIAGNOSIS — G8929 Other chronic pain: Secondary | ICD-10-CM | POA: Insufficient documentation

## 2023-04-15 DIAGNOSIS — M25512 Pain in left shoulder: Secondary | ICD-10-CM | POA: Insufficient documentation

## 2023-04-15 MED ORDER — ONDANSETRON HCL 4 MG PO TABS
4.0000 mg | ORAL_TABLET | Freq: Once | ORAL | Status: AC
  Administered 2023-04-15: 4 mg via ORAL
  Filled 2023-04-15: qty 1

## 2023-04-15 MED ORDER — OXYCODONE-ACETAMINOPHEN 5-325 MG PO TABS: 1.0000 | ORAL_TABLET | Freq: Four times a day (QID) | ORAL | 0 refills | Status: DC | PRN

## 2023-04-15 MED ORDER — OXYCODONE-ACETAMINOPHEN 5-325 MG PO TABS
1.0000 | ORAL_TABLET | Freq: Once | ORAL | Status: AC
  Administered 2023-04-15: 1 via ORAL
  Filled 2023-04-15: qty 1

## 2023-04-15 MED ORDER — ONDANSETRON HCL 4 MG PO TABS: 4.0000 mg | ORAL_TABLET | Freq: Four times a day (QID) | ORAL | 1 refills | Status: DC

## 2023-04-15 NOTE — ED Triage Notes (Signed)
 Pt reports bilateral throbbing leg pain x2 months. Not getting worse but not getting better. Reports left arm pain starting at 1000 today. Feels the same as the leg pain. Left hand "little numb at times today" Full ROM, no numbness at this time. Peripheral pulses intact. Pt reports having cancer in bones, chest, scalp, spine and throughout. Pt stopped receiving cancer treatment 2 months ago.

## 2023-04-15 NOTE — ED Provider Notes (Signed)
 [place] EMERGENCY DEPARTMENT AT [facility] Provider Note   CSN: [id number] Arrival date & time: 04/15/23  1914     History  Chief Complaint  Patient presents with   Cancer   Muscle Pain    Audrey [name] is a 83 y.o. female.  83 year old female with a prior medical history as detailed below presents for evaluation.  Patient reports acute on chronic pain.  She complains of throbbing pain in her left shoulder and arm.  She reports a history of metastatic cancer with lesions identified in her bones, chest, scalp, spine.  She has recently decided to stop receiving aggressive treatment.  She has an appointment with palliative care later this month.  She is currently using hydrocodone at home for pain.  She reports that her pain was not controlled with hydrocodone.  The history is provided by the patient.       Home Medications Prior to Admission medications   Medication Sig Start Date End Date Taking? Authorizing Provider  ondansetron (ZOFRAN) 4 MG tablet Take 1 tablet (4 mg total) by mouth every 6 (six) hours. 04/15/23  Yes Wynetta Fines, MD  oxyCODONE-acetaminophen (PERCOCET/ROXICET) 5-325 MG tablet Take 1 tablet by mouth every 6 (six) hours as needed for severe pain (pain score 7-10). 04/15/23  Yes Wynetta Fines, MD  acetaminophen (TYLENOL) 325 MG tablet Take 2 tablets (650 mg total) by mouth every 6 (six) hours as needed. 06/17/22   Sloan Leiter, DO  amLODipine (NORVASC) 2.5 MG tablet Take 2.5 mg by mouth daily. 05/16/19   [provider]  ascorbic acid (VITAMIN C) 500 MG tablet Take by mouth daily.    [provider]  cholecalciferol (VITAMIN D3) 25 MCG (1000 UNIT) tablet Take 1,000 Units by mouth daily.    [provider]  cyclobenzaprine (FLEXERIL) 5 MG tablet Take 1 tablet (5 mg total) by mouth 3 (three) times daily as needed for muscle spasms. 04/16/22   Serena Croissant, MD  diphenoxylate-atropine (LOMOTIL) 2.5-0.025 MG tablet Take 1  tablet by mouth 4 (four) times daily as needed for diarrhea or loose stools. 01/25/23   Serena Croissant, MD  docusate sodium (COLACE) 100 MG capsule Take 1 capsule (100 mg total) by mouth every 12 (twelve) hours. 12/28/22   Renne Crigler, PA-C  HYDROcodone-acetaminophen (NORCO/VICODIN) 5-325 MG tablet Take 1 tablet by mouth every 6 (six) hours as needed for moderate pain (pain score 4-6). 12/30/22   Serena Croissant, MD  ketotifen (ZADITOR) 0.025 % ophthalmic solution Place 1 drop into both eyes 2 (two) times daily as needed (dry eyes).    [provider]  megestrol (MEGACE) 40 MG/ML suspension Take 5 mLs (200 mg total) by mouth daily. 03/15/23   Serena Croissant, MD  Multiple Vitamin (MULTIVITAMIN) capsule Take 1 capsule by mouth daily. 09/03/15   [provider]  omeprazole (PRILOSEC) 20 MG capsule Take 20 mg by mouth daily as needed (acid reflux).    [provider]  ondansetron (ZOFRAN) 8 MG tablet Take 1 tablet (8 mg total) by mouth every 8 (eight) hours as needed for nausea or vomiting. 01/25/23   Serena Croissant, MD  polyethylene glycol (MIRALAX / GLYCOLAX) 17 g packet Take 17 g by mouth daily as needed for moderate constipation.    [provider]      Allergies    Ativan [lorazepam], Cefuroxime, Aspirin, Codeine, Cyclosporine modified, and Penicillins    Review of Systems   Review of Systems  All other  systems reviewed and are negative.   Physical Exam Updated Vital Signs BP (!) 153/75 (BP Location: Left Arm)   Pulse (!) 107   Temp 98.1 F (36.7 C) (Oral)   Resp 20   Ht 5' 2.5" (1.588 m)   Wt 52.2 kg   SpO2 99%   BMI 20.70 kg/m  Physical Exam Vitals and nursing note reviewed.  Constitutional:      General: She is not in acute distress.    Appearance: Normal appearance. She is well-developed.  HENT:     Head: Normocephalic and atraumatic.  Eyes:     Conjunctiva/sclera: Conjunctivae normal.     Pupils: Pupils are equal, round, and reactive to  light.  Cardiovascular:     Rate and Rhythm: Normal rate and regular rhythm.     Heart sounds: Normal heart sounds.  Pulmonary:     Effort: Pulmonary effort is normal. No respiratory distress.     Breath sounds: Normal breath sounds.  Abdominal:     General: There is no distension.     Palpations: Abdomen is soft.     Tenderness: There is no abdominal tenderness.  Musculoskeletal:        General: No deformity. Normal range of motion.     Cervical back: Normal range of motion and neck supple.  Skin:    General: Skin is warm and dry.  Neurological:     General: No focal deficit present.     Mental Status: She is alert and oriented to person, place, and time.     ED Results / Procedures / Treatments   Labs (all labs ordered are listed, but only abnormal results are displayed) Labs Reviewed - No data to display  EKG None  Radiology No results found.  Procedures Procedures    Medications Ordered in ED Medications  ondansetron (ZOFRAN) tablet 4 mg (4 mg Oral Given 04/15/23 2056)  oxyCODONE-acetaminophen (PERCOCET/ROXICET) 5-325 MG per tablet 1 tablet (1 tablet Oral Given 04/15/23 2056)    ED Course/ Medical Decision Making/ A&P                                 Medical Decision Making Risk Prescription drug management.    Medical Screen Complete  This patient presented to the ED with complaint of pain.  This complaint involves an extensive number of treatment options. The initial differential diagnosis includes, but is not limited to, pain secondary to metastatic disease  This presentation is: Chronic, Self-Limited, Previously Undiagnosed, Uncertain Prognosis, Complicated, and Systemic Symptoms  Patient presents with complaint of uncontrolled pain secondary to metastatic disease.  Patient is here primarily for pain control.  She declines additional testing or workup in the ED.  She specifically declines IV, labs, imaging.  She is requesting oral pain and nausea  medication.  She wants something stronger than the hydrocodone that she is currently using.  With administration of Percocet and Zofran she feels much improved.  She now desires discharge.  She has follow-up appointments in the outpatient setting with both her oncology team and palliative care.  She and her family member at bedside understand need for close outpatient follow-up.  Strict return precautions given and understood.  Co morbidities that complicated the patient's evaluation  See HPI   Additional history obtained:  External records from outside sources obtained and reviewed including prior ED visits and prior Inpatient records.   Problem List / ED Course:  Chronic  Pain   Reevaluation:  After the interventions noted above, I reevaluated the patient and found that they have: improved  Disposition:  After consideration of the diagnostic results and the patients response to treatment, I feel that the patent would benefit from close outpatient followup.          Final Clinical Impression(s) / ED Diagnoses Final diagnoses:  Pain    Rx / DC Orders ED Discharge Orders          Ordered    oxyCODONE-acetaminophen (PERCOCET/ROXICET) 5-325 MG tablet  Every 6 hours PRN        04/15/23 2148    ondansetron (ZOFRAN) 4 MG tablet  Every 6 hours        04/15/23 2148              Wynetta Fines, MD 04/15/23 2216

## 2023-04-15 NOTE — Discharge Instructions (Signed)
 Return for any problem.  ?

## 2023-04-26 ENCOUNTER — Inpatient Hospital Stay: Attending: Hematology and Oncology | Admitting: Hematology and Oncology

## 2023-04-26 ENCOUNTER — Other Ambulatory Visit: Payer: Self-pay | Admitting: *Deleted

## 2023-04-26 ENCOUNTER — Inpatient Hospital Stay (HOSPITAL_BASED_OUTPATIENT_CLINIC_OR_DEPARTMENT_OTHER): Admitting: Nurse Practitioner

## 2023-04-26 ENCOUNTER — Encounter: Payer: Self-pay | Admitting: Nurse Practitioner

## 2023-04-26 VITALS — BP 142/65 | HR 99 | Temp 97.8°F | Resp 18 | Ht 62.5 in | Wt 113.8 lb

## 2023-04-26 DIAGNOSIS — C50411 Malignant neoplasm of upper-outer quadrant of right female breast: Secondary | ICD-10-CM

## 2023-04-26 DIAGNOSIS — C7951 Secondary malignant neoplasm of bone: Secondary | ICD-10-CM | POA: Insufficient documentation

## 2023-04-26 DIAGNOSIS — G893 Neoplasm related pain (acute) (chronic): Secondary | ICD-10-CM | POA: Diagnosis not present

## 2023-04-26 DIAGNOSIS — C7801 Secondary malignant neoplasm of right lung: Secondary | ICD-10-CM | POA: Insufficient documentation

## 2023-04-26 DIAGNOSIS — Z170 Estrogen receptor positive status [ER+]: Secondary | ICD-10-CM | POA: Insufficient documentation

## 2023-04-26 DIAGNOSIS — K5903 Drug induced constipation: Secondary | ICD-10-CM

## 2023-04-26 DIAGNOSIS — Z515 Encounter for palliative care: Secondary | ICD-10-CM | POA: Diagnosis not present

## 2023-04-26 DIAGNOSIS — R530 Neoplastic (malignant) related fatigue: Secondary | ICD-10-CM

## 2023-04-26 MED ORDER — OXYCODONE-ACETAMINOPHEN 5-325 MG PO TABS: 1.0000 | ORAL_TABLET | Freq: Four times a day (QID) | ORAL | 0 refills | Status: DC | PRN

## 2023-04-26 MED ORDER — XTAMPZA ER 9 MG PO C12A
9.0000 mg | EXTENDED_RELEASE_CAPSULE | Freq: Two times a day (BID) | ORAL | 0 refills | Status: DC
Start: 2023-04-26 — End: 2023-05-26

## 2023-04-26 MED ORDER — LINACLOTIDE 72 MCG PO CAPS: 72.0000 ug | ORAL_CAPSULE | Freq: Every day | ORAL | 6 refills | Status: DC

## 2023-04-26 MED ORDER — OXYCODONE HCL ER 10 MG PO T12A: 10.0000 mg | EXTENDED_RELEASE_TABLET | Freq: Two times a day (BID) | ORAL | 0 refills | Status: DC

## 2023-04-26 NOTE — Progress Notes (Signed)
 Patient Care Team: Maris Berger, MD as PCP - General (Family Medicine) Serena Croissant, MD as Consulting Physician (Hematology and Oncology) Pickenpack-Cousar, Arty Baumgartner, NP as Nurse Practitioner [facility] and Palliative Medicine)  DIAGNOSIS:  Encounter Diagnosis  Name Primary?   Malignant neoplasm of upper-outer quadrant of right breast in female, estrogen receptor positive (HCC) Yes    SUMMARY OF ONCOLOGIC HISTORY: Oncology History  Malignant neoplasm of upper-outer quadrant of right breast in female, estrogen receptor positive (HCC)  07/13/2019 Initial Diagnosis   History of right breast cancer on 2017. Chest CT on 06/06/19 showed a mass in the right axilla and a mass in the right lower lung lobe. PET scan on 07/03/19 showed a right lower lobe pulmonary nodule concerning for malignancy, the right axillary mass consistent with local breast cancer recurrence, and small hypermetabolic right axillary and neck nodes consistent with metastatic adenopathy. Biopsy of the right axilla on 07/13/19 showed carcinoma consistent with breast carcinoma, HER-2 negative (1+), ER+ 95%, PR+ 75%, Ki67 25%.   08/14/2019 -  Anti-estrogen oral therapy   Initially anastrozole alone,   Verzenio October 2022 (Verzenio discontinued for diarrhea), switched to [facility] 12/08/2020   09/03/2022 Cancer Staging   Staging form: Breast, AJCC 8th Edition - Clinical: Stage IV (pM1) - Signed by Loa Socks, NP on 09/03/2022     CHIEF COMPLIANT: Follow-up of metastatic breast cancer  HISTORY OF PRESENT ILLNESS:  History of Present Illness The patient, with a history of metastatic cancer, presents with multiple areas of pain including her left arm, leg, and tailbone. The pain in her arm was severe enough to prompt a visit to the emergency room, where she was given oxycodone for pain management. The patient reports that the oxycodone is effective in managing her pain, but has led to constipation. She has tried  marijuana for pain relief, which she reports was effective for a couple of hours without any adverse effects. The patient also reports occasional severe headaches, which are not frequent but are intense when she occurs. The patient's pain appears to be migratory, affecting different areas at different times. She has declined further tests and is focused on symptom management.     ALLERGIES:  is allergic to ativan [lorazepam], cefuroxime, aspirin, codeine, cyclosporine modified, and penicillins.  MEDICATIONS:  Current Outpatient Medications  Medication Sig Dispense Refill   linaclotide (LINZESS) 72 MCG capsule Take 1 capsule (72 mcg total) by mouth daily before breakfast. 30 capsule 6   acetaminophen (TYLENOL) 325 MG tablet Take 2 tablets (650 mg total) by mouth every 6 (six) hours as needed. 36 tablet 0   amLODipine (NORVASC) 2.5 MG tablet Take 2.5 mg by mouth daily.     ascorbic acid (VITAMIN C) 500 MG tablet Take by mouth daily.     cholecalciferol (VITAMIN D3) 25 MCG (1000 UNIT) tablet Take 1,000 Units by mouth daily.     cyclobenzaprine (FLEXERIL) 5 MG tablet Take 1 tablet (5 mg total) by mouth 3 (three) times daily as needed for muscle spasms. 30 tablet 3   docusate sodium (COLACE) 100 MG capsule Take 1 capsule (100 mg total) by mouth every 12 (twelve) hours. 30 capsule 0   ketotifen (ZADITOR) 0.025 % ophthalmic solution Place 1 drop into both eyes 2 (two) times daily as needed (dry eyes).     megestrol (MEGACE) 40 MG/ML suspension Take 5 mLs (200 mg total) by mouth daily. 150 mL 0   Multiple Vitamin (MULTIVITAMIN) capsule Take 1 capsule by mouth daily.  omeprazole (PRILOSEC) 20 MG capsule Take 20 mg by mouth daily as needed (acid reflux).     ondansetron (ZOFRAN) 4 MG tablet Take 1 tablet (4 mg total) by mouth every 6 (six) hours. 20 tablet 1   ondansetron (ZOFRAN) 8 MG tablet Take 1 tablet (8 mg total) by mouth every 8 (eight) hours as needed for nausea or vomiting. 30 tablet 3    oxyCODONE ER (XTAMPZA ER) 9 MG C12A Take 9 mg by mouth every 12 (twelve) hours. 30 capsule 0   oxyCODONE-acetaminophen (PERCOCET/ROXICET) 5-325 MG tablet Take 1 tablet by mouth every 6 (six) hours as needed for severe pain (pain score 7-10). 20 tablet 0   polyethylene glycol (MIRALAX / GLYCOLAX) 17 g packet Take 17 g by mouth daily as needed for moderate constipation.     No current facility-administered medications for this visit.    PHYSICAL EXAMINATION: ECOG PERFORMANCE STATUS: 1 - Symptomatic but completely ambulatory  Vitals:   04/26/23 1117  BP: (!) 142/65  Pulse: 99  Resp: 18  Temp: 97.8 F (36.6 C)  SpO2: 99%   Filed Weights   04/26/23 1117  Weight: 113 lb 12.8 oz (51.6 kg)      LABORATORY DATA:  I have reviewed the data as listed    Latest Ref Rng & Units 02/21/2023   10:30 AM 02/08/2023    9:49 AM 01/25/2023   11:15 AM  CMP  Glucose 70 - 99 mg/dL 88  161  81   BUN 8 - 23 mg/dL 18  22  21    Creatinine 0.44 - 1.00 mg/dL 0.96  0.45  4.09   Sodium 135 - 145 mmol/L 135  138  139   Potassium 3.5 - 5.1 mmol/L 4.1  3.8  4.0   Chloride 98 - 111 mmol/L 99  101  107   CO2 22 - 32 mmol/L 28  28  22    Calcium 8.9 - 10.3 mg/dL 81.1  91.4  78.2   Total Protein 6.5 - 8.1 g/dL 7.3  7.2  7.2   Total Bilirubin 0.0 - 1.2 mg/dL 0.4  0.6  0.6   Alkaline Phos 38 - 126 U/L 158  142  121   AST 15 - 41 U/L 17  22  22    ALT 0 - 44 U/L 13  21  17      Lab Results  Component Value Date   WBC 11.9 (H) 02/21/2023   HGB 10.5 (L) 02/21/2023   HCT 31.6 (L) 02/21/2023   MCV 90.3 02/21/2023   PLT 430 (H) 02/21/2023   NEUTROABS 7.6 02/21/2023    ASSESSMENT & PLAN:  Malignant neoplasm of upper-outer quadrant of right breast in female, estrogen receptor positive (HCC) H/O right breast cancer on 2017 and 1999: Lumpectomy.  (Bilateral mastectomies in 2017: Seen by Dr. Rennis Harding, could not tolerate antiestrogen therapy) Chest CT on 06/06/19 showed a mass in the right axilla and a mass in  the right lower lung lobe. PET scan on 07/03/19 showed a right lower lobe pulmonary nodule concerning for malignancy, the right axillary mass consistent with local breast cancer recurrence, and small hypermetabolic right axillary and neck nodes consistent with metastatic adenopathy. Biopsy of the right axilla on 07/13/19 showed carcinoma consistent with breast carcinoma, HER-2 negative (1+), ER+ 95%, PR+ 75%, Ki67 25%. ---------------------------------------------------------------------------------------------------------------------------------------------- Current treatment: Anastrozole 1 mg daily started 08/14/2019 with [name] (Verzinio was discontinued because of diarrhea), switched to Faslodex with Ibrance 01/20/2022 discontinued 12/08/2022 due to progression  CT CAP 11/01/2022: Mild response to therapy of right axillary lymph nodes, right lower lobe lung mass minimally increased in size from 4 cm to 4.1 cm, osseous metastases: New T12 suspicious for progressive disease    Guardant360 11/19/2022: PIK 3 CA mutation, ESR 1 amplification high, BRCA2 loss, CDK N2 a loss, MTAP loss, RB1 by allelic loss detected   Treatment plan: Capivasertib with fulvestrant (started 12/21/2022) Capivasertib discontinued 02/08/2023 due to severe intolerance, fatigue, nausea and vomiting, lack of appetite, leg pains   Treatment options: I discussed with the patient about Xeloda, exemestane and everolimus, Enhertu, Trodelvy as well as future treatment options like imlunestrant with Verzinio etc.  We decided not to do any treatments for a month to allow her to get stronger and gain more energy.  CT CAP 03/07/2023: Slight progression in the bone metastases in the lumbar spine but otherwise overall stable findings   Decreased appetite: on Megace. Consult palliative care.  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 Focus on symptom management and quality of life. - Monitor pain levels. Consider imaging if pain becomes severe and localized for potential radiation therapy.  Chronic Pain Chronic pain from metastatic bone disease in left shoulder, arm, leg, and tailbone. Pain varies in intensity. Concern about constipation from pain medication. Marijuana provides temporary relief. - Continue oxycodone as needed. - Coordinate with palliative care for home visits to manage symptoms and optimize pain control. - Educated on Linzess for constipation management, suggested every third day or more frequently if needed.  Constipation Constipation from pain medication. Uses MiraLAX daily. Cautious with Linzess due to fear of severe bowel movements. - Continue daily MiraLAX. - Prescribe low-dose Linzess every third day or more frequently if needed. - Discuss magcitrate or lactulose as last-resort options.  Goals of Care Focus on quality of life through symptom management, not active cancer treatment. Open to palliative care services. - Engage palliative care for symptom management and quality of life improvement. - Educated on palliative vs hospice care, emphasizing quality of life and symptom management.  Follow-up Follow-up in a couple of months unless symptoms necessitate earlier intervention. Focus on monitoring symptoms and adjusting care. - Schedule follow-up in a couple of months. - Coordinate with palliative care for ongoing symptom management and support.      No orders of the defined types were placed in this encounter.  The patient has a good understanding of the overall plan. she agrees with it. she will call with any problems that may develop before the next visit here. Total time spent: 30 mins including face to face time and time spent for planning, charting and co-ordination of care   Viinay K Jaryn Rosko,  MD 04/26/23

## 2023-04-26 NOTE — Patient Instructions (Signed)
 VISIT SUMMARY: Audrey [name],  Thank you for allowing me to be a member of your care team here at the [facility].   Today, we discussed your persistent pain, constipation, and difficulty sleeping. We reviewed your current medications and made some adjustments to better manage your symptoms.  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 We are starting you on oxycodone extended release (OxyContin) every 12 hours for consistent pain control, and you can continue taking oxycodone as needed for breakthrough pain every 6 hours. We also discussed the side effects of these medications.  -CONSTIPATION: Your constipation is likely worsened by the pain medication. We are starting you on Linzess every other day, and you can increase it to daily if needed. We are temporarily stopping Miralax to see how well Linzess works for you.  -INSOMNIA AND ANXIETY: Your difficulty sleeping and anxiety may be related to your pain. We are considering the use of muscle relaxers at bedtime to help you sleep better, but please monitor for any side effects.  INSTRUCTIONS:  Please schedule a follow-up appointment in 4 weeks to evaluate your pain management and how well the medications are working. We will also have a phone check-in by Friday to assess your initial response to the new treatment plan. If you have any questions or concerns, please contact us  at [phone] or by sending a message via MyChart. Of note if you choose to send me a direct message in MyChart although I go by [place] my name will be listed as Adline Aho on your chart.

## 2023-04-26 NOTE — Assessment & Plan Note (Signed)
 H/O right breast cancer on 2017 and 1999: Lumpectomy.  (Bilateral mastectomies in 2017: Seen by Dr. Ciro Cress, could not tolerate antiestrogen therapy) Chest CT on 06/06/19 showed a mass in the right axilla and a mass in the right lower lung lobe. PET scan on 07/03/19 showed a right lower lobe pulmonary nodule concerning for malignancy, the right axillary mass consistent with local breast cancer recurrence, and small hypermetabolic right axillary and neck nodes consistent with metastatic adenopathy. Biopsy of the right axilla on 07/13/19 showed carcinoma consistent with breast carcinoma, HER-2 negative (1+), ER+ 95%, PR+ 75%, Ki67 25%. ---------------------------------------------------------------------------------------------------------------------------------------------- Current treatment: Anastrozole 1 mg daily started 08/14/2019 with Anibal Kent (Verzinio was discontinued because of diarrhea), switched to Faslodex with Ibrance 01/20/2022 discontinued 12/08/2022 due to progression   CT CAP 11/01/2022: Mild response to therapy of right axillary lymph nodes, right lower lobe lung mass minimally increased in size from 4 cm to 4.1 cm, osseous metastases: New T12 suspicious for progressive disease    Guardant360 11/19/2022: PIK 3 CA mutation, ESR 1 amplification high, BRCA2 loss, CDK N2 a loss, MTAP loss, RB1 by allelic loss detected   Treatment plan: Capivasertib with fulvestrant (started 12/21/2022) Capivasertib discontinued 02/08/2023 due to severe intolerance, fatigue, nausea and vomiting, lack of appetite, leg pains   Treatment options: I discussed with the patient about Xeloda, exemestane and everolimus, Enhertu, Trodelvy as well as future treatment options like imlunestrant with Verzinio etc.  We decided not to do any treatments for a month to allow her to get stronger and gain more energy.  CT CAP 03/07/2023: Slight progression in the bone metastases in the lumbar spine but otherwise overall stable  findings   Decreased appetite: on Megace.  Follow-up in 6 weeks to reassess her symptoms and decide if any additional treatments would be required.  Based on patient's wishes we may not put her on any treatments but will watch and monitor her.

## 2023-04-26 NOTE — Progress Notes (Signed)
 Palliative Medicine [facility]  Telephone:([phone] Fax:(336) [phone]   Name: Audrey [name] Date: 04/26/2023 MRN: [id number]  DOB: 1940/09/26  Patient Care Team: Maris Berger, MD as PCP - General (Family Medicine) Serena Croissant, MD as Consulting Physician (Hematology and Oncology)    REASON FOR CONSULTATION: Audrey [name] is a 83 y.o. 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  Palliative is seeing patient for symptom management and goals of care.    SOCIAL HISTORY:     reports that she has been smoking cigarettes. She started smoking about 20 years ago. She has a 7.5 pack-year smoking history. She has never used smokeless tobacco. She reports that she does not drink alcohol and does not use drugs.  ADVANCE DIRECTIVES:  None on file   CODE STATUS: Full code  PAST MEDICAL HISTORY: Past Medical History:  Diagnosis Date   Anxiety    Arthritis    "hands" (02/25/2014)   Borderline type 2 diabetes mellitus    Breast cancer, right breast (HCC) 1999   recurrence 08/2017   COVID    March 2021 and early 2022   GERD (gastroesophageal reflux disease)    High cholesterol    "can't take the RX; makes my bones ache" (02/25/2014)   History of hiatal hernia    History of stomach ulcers    Hypertension    Migraines    hx   Pneumonia    PONV (postoperative nausea and vomiting)    at last colonoscopy she was told she was hard to awaken   Shortness of breath dyspnea    w/ exertion     PAST SURGICAL HISTORY:  Past Surgical History:  Procedure Laterality Date   APPENDECTOMY     BREAST BIOPSY Right 1999   BREAST LUMPECTOMY Right 1999   BRONCHIAL BIOPSY  12/29/2020   Procedure: BRONCHIAL BIOPSIES;  Surgeon: Leslye Peer, MD;  Location: [facility] ENDOSCOPY;  Service: Pulmonary;;   BRONCHIAL BRUSHINGS  12/29/2020   Procedure: BRONCHIAL BRUSHINGS;  Surgeon: Leslye Peer,  MD;  Location: [facility] ENDOSCOPY;  Service: Pulmonary;;   BRONCHIAL NEEDLE ASPIRATION BIOPSY  12/29/2020   Procedure: BRONCHIAL NEEDLE ASPIRATION BIOPSIES;  Surgeon: Leslye Peer, MD;  Location: [facility] ENDOSCOPY;  Service: Pulmonary;;   BRONCHIAL WASHINGS  12/29/2020   Procedure: BRONCHIAL WASHINGS;  Surgeon: Leslye Peer, MD;  Location: [facility] ENDOSCOPY;  Service: Pulmonary;;   DILATION AND CURETTAGE OF UTERUS     FINE NEEDLE ASPIRATION  12/29/2020   Procedure: FINE NEEDLE ASPIRATION (FNA) LINEAR;  Surgeon: Leslye Peer, MD;  Location: [facility] ENDOSCOPY;  Service: Pulmonary;;   PARTIAL KNEE ARTHROPLASTY Left 02/25/2014   Procedure: LEFT UNICOMPARTMENTAL (medial compartment) KNEE;  Surgeon: Dannielle Huh, MD;  Location: [facility] OR;  Service: Orthopedics;  Laterality: Left;   REPLACEMENT UNICONDYLAR JOINT KNEE Left 02/25/2014   TONSILLECTOMY     VAGINAL HYSTERECTOMY     VIDEO BRONCHOSCOPY WITH ENDOBRONCHIAL ULTRASOUND N/A 12/29/2020   Procedure: VIDEO BRONCHOSCOPY WITH ENDOBRONCHIAL ULTRASOUND;  Surgeon: Leslye Peer, MD;  Location: [facility] ENDOSCOPY;  Service: Pulmonary;  Laterality: N/A;   VIDEO BRONCHOSCOPY WITH RADIAL ENDOBRONCHIAL ULTRASOUND  12/29/2020   Procedure: RADIAL ENDOBRONCHIAL ULTRASOUND;  Surgeon: Leslye Peer, MD;  Location: [facility] ENDOSCOPY;  Service: Pulmonary;;    HEMATOLOGY/ONCOLOGY HISTORY:  Oncology History  Malignant neoplasm of upper-outer quadrant of right breast in female, estrogen receptor positive (HCC)  07/13/2019 Initial Diagnosis   History  of right breast cancer on 2017. Chest CT on 06/06/19 showed a mass in the right axilla and a mass in the right lower lung lobe. PET scan on 07/03/19 showed a right lower lobe pulmonary nodule concerning for malignancy, the right axillary mass consistent with local breast cancer recurrence, and small hypermetabolic right axillary and neck nodes consistent with metastatic adenopathy. Biopsy of the right axilla on 07/13/19 showed carcinoma consistent with  breast carcinoma, HER-2 negative (1+), ER+ 95%, PR+ 75%, Ki67 25%.   08/14/2019 -  Anti-estrogen oral therapy   Initially anastrozole alone,   Verzenio October 2022 (Verzenio discontinued for diarrhea), switched to [facility] 12/08/2020   09/03/2022 Cancer Staging   Staging form: Breast, AJCC 8th Edition - Clinical: Stage IV (pM1) - Signed by Loa Socks, NP on 09/03/2022     ALLERGIES:  is allergic to ativan [lorazepam], cefuroxime, aspirin, codeine, cyclosporine modified, and penicillins.  MEDICATIONS:  Current Outpatient Medications  Medication Sig Dispense Refill   acetaminophen (TYLENOL) 325 MG tablet Take 2 tablets (650 mg total) by mouth every 6 (six) hours as needed. 36 tablet 0   amLODipine (NORVASC) 2.5 MG tablet Take 2.5 mg by mouth daily.     ascorbic acid (VITAMIN C) 500 MG tablet Take by mouth daily.     cholecalciferol (VITAMIN D3) 25 MCG (1000 UNIT) tablet Take 1,000 Units by mouth daily.     cyclobenzaprine (FLEXERIL) 5 MG tablet Take 1 tablet (5 mg total) by mouth 3 (three) times daily as needed for muscle spasms. 30 tablet 3   docusate sodium (COLACE) 100 MG capsule Take 1 capsule (100 mg total) by mouth every 12 (twelve) hours. 30 capsule 0   ketotifen (ZADITOR) 0.025 % ophthalmic solution Place 1 drop into both eyes 2 (two) times daily as needed (dry eyes).     linaclotide (LINZESS) 72 MCG capsule Take 1 capsule (72 mcg total) by mouth daily before breakfast. 30 capsule 6   megestrol (MEGACE) 40 MG/ML suspension Take 5 mLs (200 mg total) by mouth daily. 150 mL 0   Multiple Vitamin (MULTIVITAMIN) capsule Take 1 capsule by mouth daily.     omeprazole (PRILOSEC) 20 MG capsule Take 20 mg by mouth daily as needed (acid reflux).     ondansetron (ZOFRAN) 4 MG tablet Take 1 tablet (4 mg total) by mouth every 6 (six) hours. 20 tablet 1   ondansetron (ZOFRAN) 8 MG tablet Take 1 tablet (8 mg total) by mouth every 8 (eight) hours as needed for nausea or vomiting. 30 tablet  3   oxyCODONE-acetaminophen (PERCOCET/ROXICET) 5-325 MG tablet Take 1 tablet by mouth every 6 (six) hours as needed for severe pain (pain score 7-10). 20 tablet 0   polyethylene glycol (MIRALAX / GLYCOLAX) 17 g packet Take 17 g by mouth daily as needed for moderate constipation.     No current facility-administered medications for this visit.    VITAL SIGNS: There were no vitals taken for this visit. There were no vitals filed for this visit.  Estimated body mass index is 20.48 kg/m as calculated from the following:   Height as of an earlier encounter on 04/26/23: 5' 2.5" (1.588 m).   Weight as of an earlier encounter on 04/26/23: 113 lb 12.8 oz (51.6 kg).  LABS: CBC:    Component Value Date/Time   WBC 11.9 (H) 02/21/2023 1030   WBC 11.4 (H) 12/28/2022 1255   HGB 10.5 (L) 02/21/2023 1030   HCT 31.6 (L) 02/21/2023 1030   PLT  430 (H) 02/21/2023 1030   MCV 90.3 02/21/2023 1030   NEUTROABS 7.6 02/21/2023 1030   LYMPHSABS 3.2 02/21/2023 1030   MONOABS 0.7 02/21/2023 1030   EOSABS 0.2 02/21/2023 1030   BASOSABS 0.1 02/21/2023 1030   Comprehensive Metabolic Panel:    Component Value Date/Time   NA 135 02/21/2023 1030   K 4.1 02/21/2023 1030   CL 99 02/21/2023 1030   CO2 28 02/21/2023 1030   BUN 18 02/21/2023 1030   CREATININE 1.06 (H) 02/21/2023 1030   GLUCOSE 88 02/21/2023 1030   CALCIUM 10.6 (H) 02/21/2023 1030   AST 17 02/21/2023 1030   ALT 13 02/21/2023 1030   ALKPHOS 158 (H) 02/21/2023 1030   BILITOT 0.4 02/21/2023 1030   PROT 7.3 02/21/2023 1030   ALBUMIN 4.2 02/21/2023 1030    RADIOGRAPHIC STUDIES: No results found.  PERFORMANCE STATUS (ECOG) : 1 - Symptomatic but completely ambulatory  Review of Systems Unless otherwise noted, a complete review of systems is negative.  Physical Exam General: NAD, ambulatory with cane  Cardiovascular: regular rate and rhythm Pulmonary: clear ant fields Abdomen: soft, nontender, + bowel sounds Extremities: no edema, no  joint deformities Skin: no rashes Neurological: Alert and oriented x3  IMPRESSION: Discussed the use of AI scribe software for clinical note transcription with the patient, who gave verbal consent to proceed.  History of Present Illness Audrey [name] is an 83 year old female with a history of metastatic breast cancer and pelvic fractures who presents for initial palliative visit. She is having persistent pain and constipation. Patient is accompanied by her daughter, Audrey [name]. Patient is alert and able to engage appropriately in discussions.   I introduced myself, [place]gan RN, and Palliative's role in collaboration with the oncology team. Concept of Palliative Care was introduced as specialized medical care for people and their families living with serious illness.  It focuses on providing relief from the symptoms and stress of a serious illness.  The goal is to improve quality of life for both the patient and the family. Values and goals of care important to patient and family were attempted to be elicited.   Ms. Lamoreaux lives in the home with her only daughter. 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 At home she is able to perform most ADLs independently with some limitations due to pain.   She reports difficulty sleeping, stating she 'can't get comfortable' due to pain and anxiety. She sleeps approximately two hours per night and experiences frequent urination. She does not take medication for anxiety but notes that her mind 'doesn't shut down' at night.  She experiences significant constipation, which she attributes to the use of pain medication. She takes Miralax daily and Linzess intermittently, but these have not been effective recently. She describes her constipation as severe, stating 'it's like concrete up there.' Education provided on bowel regimen in the setting of opioid use. Encouraged patient to take Linzess daily with option of every other day if bowel patterns become  frequent or loose. Understands must take consistently.   Ms. Strong has experienced persistent pain since a fall in June of last year, resulting in two pelvic fractures. The pain is localized in the lower pelvic area and sometimes radiates to the back, resembling a pulled muscle. She also attributes some areas of her pain to her metastatic bone disease. She has difficulty with mobility, particularly getting up and down, and experiences constant pain in her buttocks. Despite taking pain medication, the pain persists,  and she uses a walker at home for stability. Her current regimen consist of oxycodone however she has been reluctant to take due to constipation. Previously was taking one tablet every six hours as needed. She also has flexeril for muscle spasms, which she takes occasionally, but reports mainly using at night due to feelings of shakiness and drowsiness.  Education provided on the use extended release pain medication to gain better control of her pain. I discussed use, efficacy, administration, and potential side effects. Patient and daughter verbalized understanding and appreciation.   I discussed the importance of continued conversation with family and their medical providers regarding overall plan of care and treatment options, ensuring decisions are within the context of the patients values and GOCs. Assessment & Plan Established therapeutic relationship. Education provided on palliative's role in collaboration with their Oncology/Radiation team.  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 Current oxycodone effective but causes constipation. Anxiety and insomnia may worsen pain. Oxycodone extended release prescribed for consistent pain control. Side effects discussed. - Initiate oxycodone extended release (OxyContin or Xtampza) every 12 hours. - Continue oxycodone 5/325mg  as needed for breakthrough  pain every 6 hours. - Start Linzess every other day for constipation, increase to daily if needed. - Discontinue Miralax temporarily to assess Linzess effectiveness. - Consider muscle relaxers at bedtime for sleep, monitor for side effects.  Constipation Constipation worsened by oxycodone. Linzess prescribed with emphasis on consistent use. - Start Linzess every other day, increase to daily if needed. - Discontinue Miralax temporarily to evaluate Linzess effectiveness and avoid diarrhea.  Insomnia and Anxiety Insomnia and anxiety affect sleep, possibly related to pain. Muscle relaxers considered for sleep aid. - Consider muscle relaxers at bedtime, monitor for side effects.  Follow-up Follow-up needed to evaluate pain management and medication efficacy. - Schedule follow-up in 4 weeks. - Phone check-in by Friday to assess initial response. - Provide contact information for questions or concerns.  Patient expressed understanding and was in agreement with this plan. She also understands that She can call the clinic at any time with any questions, concerns, or complaints.   Thank you for your referral and allowing Palliative to assist in Audrey [name]'s care.   Number and complexity of problems addressed: HIGH - 1 or more chronic illnesses with SEVERE exacerbation, progression, or side effects of treatment - advanced cancer, pain. Any controlled substances utilized were prescribed in the context of palliative care.  Visit consisted of counseling and education dealing with the complex and emotionally intense issues of symptom management and palliative care in the setting of serious and potentially life-threatening illness.  Signed by: Dellia Ferguson, AGPCNP-BC Palliative Medicine Team/[facility]

## 2023-04-29 ENCOUNTER — Other Ambulatory Visit: Payer: Self-pay | Admitting: Nurse Practitioner

## 2023-04-29 ENCOUNTER — Telehealth: Payer: Self-pay

## 2023-04-29 DIAGNOSIS — Z170 Estrogen receptor positive status [ER+]: Secondary | ICD-10-CM

## 2023-04-29 DIAGNOSIS — K5903 Drug induced constipation: Secondary | ICD-10-CM

## 2023-04-29 MED ORDER — NALOXEGOL OXALATE 12.5 MG PO TABS: 12.5000 mg | ORAL_TABLET | Freq: Every day | ORAL | 1 refills | Status: DC

## 2023-04-29 MED ORDER — LUBIPROSTONE 8 MCG PO CAPS: 8.0000 ug | ORAL_CAPSULE | Freq: Two times a day (BID) | ORAL | 1 refills | Status: DC

## 2023-04-29 NOTE — Telephone Encounter (Signed)
 Pt called reporting that her medications were too expensive, alternatives sent in and confirmed with pharmacy that they went through her insurance. Attempted to call pt and confirm this but no answer, LVM

## 2023-05-13 ENCOUNTER — Other Ambulatory Visit: Payer: Self-pay | Admitting: Nurse Practitioner

## 2023-05-13 ENCOUNTER — Telehealth: Payer: Self-pay

## 2023-05-13 DIAGNOSIS — Z170 Estrogen receptor positive status [ER+]: Secondary | ICD-10-CM

## 2023-05-13 MED ORDER — LINACLOTIDE 72 MCG PO CAPS: 72.0000 ug | ORAL_CAPSULE | Freq: Every day | ORAL | Status: DC

## 2023-05-13 NOTE — Telephone Encounter (Signed)
 Pt daughter called asking for a call back. RN called pt, discussed constipation, pt reports last BM was this AM, increased linzess  to daily to help better manage bowels. Discussed use of pain medications to control pain. Pt verbalized understanding of education. No further needs at this time.

## 2023-05-16 ENCOUNTER — Telehealth: Payer: Self-pay

## 2023-05-16 DIAGNOSIS — Z170 Estrogen receptor positive status [ER+]: Secondary | ICD-10-CM

## 2023-05-16 DIAGNOSIS — Z515 Encounter for palliative care: Secondary | ICD-10-CM

## 2023-05-16 NOTE — Telephone Encounter (Signed)
 RN spoke with pt daughter to discuss home health needs, education provided, verbalized understanding. Order also placed with LSCW for help with community resources. Pt and pt daughter wish to discuss before moving forwards.

## 2023-05-17 ENCOUNTER — Inpatient Hospital Stay: Attending: Hematology and Oncology | Admitting: Licensed Clinical Social Worker

## 2023-05-17 NOTE — Progress Notes (Signed)
 [facility] Clinical Social Work  Clinical Social Work was referred by  palliative care  for in home aide needs.  Clinical Social Worker contacted caregiver by phone (daughter, Zerita Hill) to offer support and assess for needs.   Pt currently lives with her daughter and they are interested in having an in home aide for additional support with ADLs. Pt currently has [facility] Medicare as insurance and does not have Medicaid or long term care insurance.  CSW provided information on in-home aide options and that all are private pay.  E-mailed information to pt's daughter and encouraged her to call back if she has questions once she reviews the information.     Noorah Giammona E Emer Onnen, LCSW  Clinical Social Worker [facility]

## 2023-05-24 ENCOUNTER — Other Ambulatory Visit: Payer: Self-pay

## 2023-05-24 ENCOUNTER — Other Ambulatory Visit (HOSPITAL_COMMUNITY): Payer: Self-pay

## 2023-05-24 DIAGNOSIS — C50411 Malignant neoplasm of upper-outer quadrant of right female breast: Secondary | ICD-10-CM

## 2023-05-24 DIAGNOSIS — Z515 Encounter for palliative care: Secondary | ICD-10-CM

## 2023-05-24 DIAGNOSIS — G893 Neoplasm related pain (acute) (chronic): Secondary | ICD-10-CM

## 2023-05-24 MED ORDER — OXYCODONE-ACETAMINOPHEN 5-325 MG PO TABS: 1.0000 | ORAL_TABLET | Freq: Four times a day (QID) | ORAL | 0 refills | Status: DC | PRN

## 2023-05-24 NOTE — Telephone Encounter (Signed)
 Pt called back asking for refill of percocet. Pt reported that xtampza  "knocked her out" for 24hrs, pt is reporting not taking this medication. Upcoming appts reviewed, no further needs at this time.

## 2023-05-24 NOTE — Telephone Encounter (Signed)
 Pt called asking for medication refill, did not specify which medication LVM for RN. RN attempted to call back, no answer, LVM and call back.

## 2023-05-26 ENCOUNTER — Encounter: Payer: Self-pay | Admitting: Nurse Practitioner

## 2023-05-26 ENCOUNTER — Inpatient Hospital Stay (HOSPITAL_BASED_OUTPATIENT_CLINIC_OR_DEPARTMENT_OTHER): Admitting: Nurse Practitioner

## 2023-05-26 VITALS — BP 154/59 | HR 97 | Temp 97.6°F | Resp 16 | Ht 62.5 in | Wt 111.1 lb

## 2023-05-26 DIAGNOSIS — M792 Neuralgia and neuritis, unspecified: Secondary | ICD-10-CM

## 2023-05-26 DIAGNOSIS — K5903 Drug induced constipation: Secondary | ICD-10-CM | POA: Diagnosis not present

## 2023-05-26 DIAGNOSIS — G893 Neoplasm related pain (acute) (chronic): Secondary | ICD-10-CM

## 2023-05-26 DIAGNOSIS — Z515 Encounter for palliative care: Secondary | ICD-10-CM

## 2023-05-26 DIAGNOSIS — C50411 Malignant neoplasm of upper-outer quadrant of right female breast: Secondary | ICD-10-CM | POA: Diagnosis not present

## 2023-05-26 DIAGNOSIS — R530 Neoplastic (malignant) related fatigue: Secondary | ICD-10-CM

## 2023-05-26 DIAGNOSIS — R110 Nausea: Secondary | ICD-10-CM

## 2023-05-26 DIAGNOSIS — Z170 Estrogen receptor positive status [ER+]: Secondary | ICD-10-CM

## 2023-05-26 MED ORDER — NALOXEGOL OXALATE 12.5 MG PO TABS: 12.5000 mg | ORAL_TABLET | Freq: Every day | ORAL | 1 refills | Status: DC

## 2023-05-26 MED ORDER — GABAPENTIN 100 MG PO CAPS: 100.0000 mg | ORAL_CAPSULE | Freq: Two times a day (BID) | ORAL | 2 refills | Status: DC

## 2023-05-26 NOTE — Progress Notes (Signed)
 Palliative Medicine [facility]  Telephone:([phone]) [phone] Fax:(336) [phone]   Name: Audrey [name] Date: 05/26/2023 MRN: [id number]  DOB: 02-Mar-1940  Patient Care Team: Ardella Beaver, MD as PCP - General (Family Medicine) Cameron Cea, MD as Consulting Physician (Hematology and Oncology) Pickenpack-Cousar, Giles Labrum, NP as Nurse Practitioner [facility] and Palliative Medicine)    INTERVAL HISTORY: Audrey [name] is a 83 y.o. 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  Palliative is seeing patient for symptom management and goals of care.   SOCIAL HISTORY:     reports that she has been smoking cigarettes. She started smoking about 20 years ago. She has a 7.5 pack-year smoking history. She has never used smokeless tobacco. She reports that she does not drink alcohol and does not use drugs.  ADVANCE DIRECTIVES:  None on file   CODE STATUS: Full code  PAST MEDICAL HISTORY: Past Medical History:  Diagnosis Date   Anxiety    Arthritis    "hands" (02/25/2014)   Borderline type 2 diabetes mellitus    Breast cancer, right breast (HCC) 1999   recurrence 08/2017   COVID    March 2021 and early 2022   GERD (gastroesophageal reflux disease)    High cholesterol    "can't take the RX; makes my bones ache" (02/25/2014)   History of hiatal hernia    History of stomach ulcers    Hypertension    Migraines    hx   Pneumonia    PONV (postoperative nausea and vomiting)    at last colonoscopy she was told she was hard to awaken   Shortness of breath dyspnea    w/ exertion     ALLERGIES:  is allergic to ativan [lorazepam], cefuroxime, aspirin, codeine, cyclosporine modified, and penicillins.  MEDICATIONS:  Current Outpatient Medications  Medication Sig Dispense Refill   gabapentin  (NEURONTIN ) 100 MG capsule Take 1 capsule (100 mg total) by mouth 2 (two) times daily. 60 capsule 2    acetaminophen  (TYLENOL ) 325 MG tablet Take 2 tablets (650 mg total) by mouth every 6 (six) hours as needed. 36 tablet 0   amLODipine (NORVASC) 2.5 MG tablet Take 2.5 mg by mouth daily.     ascorbic acid (VITAMIN C) 500 MG tablet Take by mouth daily.     cholecalciferol (VITAMIN D3) 25 MCG (1000 UNIT) tablet Take 1,000 Units by mouth daily.     cyclobenzaprine  (FLEXERIL ) 5 MG tablet Take 1 tablet (5 mg total) by mouth 3 (three) times daily as needed for muscle spasms. 30 tablet 3   docusate sodium  (COLACE) 100 MG capsule Take 1 capsule (100 mg total) by mouth every 12 (twelve) hours. 30 capsule 0   ketotifen (ZADITOR) 0.025 % ophthalmic solution Place 1 drop into both eyes 2 (two) times daily as needed (dry eyes).     linaclotide  (LINZESS ) 72 MCG capsule Take 1 capsule (72 mcg total) by mouth daily before breakfast.     megestrol  (MEGACE ) 40 MG/ML suspension Take 5 mLs (200 mg total) by mouth daily. 150 mL 0   Multiple Vitamin (MULTIVITAMIN) capsule Take 1 capsule by mouth daily.     naloxegol  oxalate (MOVANTIK ) 12.5 MG TABS tablet Take 1 tablet (12.5 mg total) by mouth daily. 30 tablet 1   omeprazole (PRILOSEC) 20 MG capsule Take 20 mg by mouth daily as needed (acid reflux).     ondansetron  (ZOFRAN ) 4 MG tablet Take 1 tablet (4  mg total) by mouth every 6 (six) hours. 20 tablet 1   ondansetron  (ZOFRAN ) 8 MG tablet Take 1 tablet (8 mg total) by mouth every 8 (eight) hours as needed for nausea or vomiting. 30 tablet 3   oxyCODONE -acetaminophen  (PERCOCET/ROXICET) 5-325 MG tablet Take 1 tablet by mouth every 6 (six) hours as needed for severe pain (pain score 7-10). 20 tablet 0   polyethylene glycol (MIRALAX / GLYCOLAX) 17 g packet Take 17 g by mouth daily as needed for moderate constipation.     No current facility-administered medications for this visit.    VITAL SIGNS: BP (!) 154/59 (BP Location: Left Arm, Patient Position: Sitting)   Pulse 97   Temp 97.6 F (36.4 C) (Temporal)   Resp 16   Ht  5' 2.5" (1.588 m)   Wt 111 lb 2 oz (50.4 kg)   SpO2 98%   BMI 20.00 kg/m  Filed Weights   05/26/23 1125  Weight: 111 lb 2 oz (50.4 kg)    Estimated body mass index is 20 kg/m as calculated from the following:   Height as of this encounter: 5' 2.5" (1.588 m).   Weight as of this encounter: 111 lb 2 oz (50.4 kg).   PERFORMANCE STATUS (ECOG) : 1 - Symptomatic but completely ambulatory   Physical Exam General: NAD Cardiovascular: regular rate and rhythm Pulmonary: normal breathing pattern Extremities: no edema, no joint deformities Skin: no rashes Neurological: AAO x3  IMPRESSION: Discussed the use of AI scribe software for clinical note transcription with the patient, who gave verbal consent to proceed.  History of Present Illness Audrey [name] is an 83 year old female who presents for management of cancer related pain and constipation. She is accompanied by her son-in-law. Denies concerns of nausea, vomiting, or diarrhea. Ongoing fatigue. Appetite is fair. Some days are better than others. Current weight is 111lbs down from 113lbs on 4/15, 120lbs on 3/4. We discussed focusing on small frequent meals and snacking in between. She occasionally consumes protein shakes, which she finds unpalatable unless mixed with fruit to make a shake or ice cream.  Ms. Weyl experiences significant constipation, with bowel movements occurring as infrequently as once a week. She currently takes Miralax powder once daily, usually mixed in her morning coffee, although she sometimes skips coffee. She was previously taking Linzess  twice daily but reduced it to once daily due to lack of efficacy. She has not tried Movantik , which is listed in her medication list, and does not recall ever using it. She has a history of diverticulitis. Education provided on discontinuing Linzess  and trying Movantik  with hopes of better bowel patterns. She knows we will continue to monitor and adjust as needed.   She  experiences pain, for which she takes oxycodone  as needed. Her pain can reach a level of seven out of ten, and oxycodone  reduces it to about four or five. She previously tried Xtampza , which caused excessive drowsiness, making her sleep for 24 hours. Pain medications exacerbate her constipation. Will continue with oxycodone  and discontinue Xtampza .   She describes throbbing pain in her right leg, extending down to her foot, accompanied by burning, numbness, and tingling. No recent falls and she can feel her steps when walking, although it is painful. She has a history of trying gabapentin  in 2022 at a dose of 300 mg, which made her feel unsteady after three days of use. We discussed retrying at reduced dose with understanding if she experiences unwanted side effects to immediately discontinue.  Patient and family verbalized understanding. Will start gabapentin  100mg  at bedtime.   Ms. Sianez occasionally uses Prilosec for acid reflux. She also takes Zofran  4 mg daily for nausea, which she experiences frequently, especially when smelling food, affecting her appetite. Her last bowel movement was on Tuesday, and she reports a general aversion to eating due to nausea. We discussed persistent use of Prilosec in addition to Zofran  8mg  as needed.   I discussed the importance of continued conversation with family and their medical providers regarding overall plan of care and treatment options, ensuring decisions are within the context of the patients values and GOCs.  Assessment & Plan Constipation Exacerbated by opioid use and possibly diverticulitis. Current regimen ineffective. Movantik  may provide relief. - Prescribe Movantik  once daily. Discontinue Linzess .   Cancer related pain Managed with oxycodone . Xtampza  caused sedation. Pain level 7/10, reduced to 4-5/10 with oxycodone . Constipation exacerbates pain. - Discontinue Xtampza . - Continue oxycodone  as needed.  Right leg pain with neuropathic  symptoms Includes throbbing, burning, numbness, and tingling. Gabapentin  300 mg not tolerated. Lower dose considered to manage symptoms and reduce oxycodone  need. - Prescribe gabapentin  100 mg at bedtime. - Monitor for tolerance and effectiveness. Discontinue if unsteadiness recurs. - Consider reducing oxycodone  if gabapentin  effective.  Nausea Managed with Zofran  4 mg. Affects appetite, exacerbated by food smells. Increasing Zofran  may help. - Continue Zofran  4 mg as needed, increase to 8 mg if necessary.  Hypertension Managed with Norvasc.  Diverticulitis May contribute to bowel issues. No acute symptoms.  Follow-up Assess response to new medications and adjust treatment as needed. - Call on Monday to assess response to Movantik  and gabapentin . - Schedule follow-up on June 16th.  Patient expressed understanding and was in agreement with this plan. She also understands that She can call the clinic at any time with any questions, concerns, or complaints.   Any controlled substances utilized were prescribed in the context of palliative care. PDMP has been reviewed.   Visit consisted of counseling and education dealing with the complex and emotionally intense issues of symptom management and palliative care in the setting of serious and potentially life-threatening illness.  Dellia Ferguson, AGPCNP-BC  Palliative Medicine Team/ Cancer Center

## 2023-05-30 ENCOUNTER — Inpatient Hospital Stay: Admitting: Nurse Practitioner

## 2023-05-31 ENCOUNTER — Telehealth: Admitting: Nurse Practitioner

## 2023-06-21 ENCOUNTER — Telehealth: Payer: Self-pay | Admitting: *Deleted

## 2023-06-21 NOTE — Telephone Encounter (Signed)
 Received call from pt family member, Jim Motts, stating pt is admitted to a hospital in [place] stating pt had fallen and broke her femur.  Pt stating they are not satisfied with the care pt is receiving at that facility and requesting advice on transferring pt to our hospital.  RN educated family that we are not able to direct admit pt and they would need to talk with the hospital at their current hospital and request for transfer to this area.  Verbalized understanding.

## 2023-06-23 ENCOUNTER — Telehealth: Payer: Self-pay

## 2023-06-23 ENCOUNTER — Inpatient Hospital Stay: Attending: Hematology and Oncology

## 2023-06-23 NOTE — Telephone Encounter (Signed)
 Pt daughter, Audrey [name], called asking for help with her mother, who has femur fx and a possible stroke in a hospital in [place], [place]. Shelvy Dickens wants to get her mother back home or closer to home to get rehab. RN discussed need for social work involvement from the hospital to coordinate discharge and home needs. Shelvy Dickens verbalized understanding and frustration, emotional support provided. No further needs at this time.

## 2023-06-23 NOTE — Progress Notes (Signed)
 [facility] CSW Progress Note  Clinical Child psychotherapist contacted caregiver by phone to discuss home health agencies.  Received referral from Maygan Hodge, RN, to contact patient's daughter, Shelvy Dickens.  Patient is currently in the hospital in [place] and daughter wanted a list of home health agencies in [place].  CSW securely emailed the list to [email or web address] per daughters request.    Kennth Peal, LCSW Clinical Social Worker [facility]

## 2023-06-23 NOTE — Telephone Encounter (Signed)
 Pt daughter called again asking for home health resources as well as information on hospice. Education provided on the difference between hospice services and home health. Verbalized understanding. Pt daughter also asked for a list of local home health agencies in the area. Referral sent to social work. No further needs at this time.

## 2023-06-27 ENCOUNTER — Ambulatory Visit: Admitting: Hematology and Oncology

## 2023-06-27 ENCOUNTER — Encounter

## 2023-08-17 NOTE — Telephone Encounter (Signed)
 err

## 2023-09-12 DEATH — deceased

## 2023-12-21 ENCOUNTER — Other Ambulatory Visit (HOSPITAL_COMMUNITY): Payer: Self-pay
